# Patient Record
Sex: Male | Born: 1945 | Race: White | Hispanic: No | Marital: Single | Smoking: Former smoker
Health system: Southern US, Community
[De-identification: ages and names within clinical notes are randomized; demographics above are authoritative.]

## PROBLEM LIST (undated history)

## (undated) DIAGNOSIS — J449 Chronic obstructive pulmonary disease, unspecified: Secondary | ICD-10-CM

## (undated) DIAGNOSIS — F4310 Post-traumatic stress disorder, unspecified: Secondary | ICD-10-CM

## (undated) DIAGNOSIS — Z923 Personal history of irradiation: Secondary | ICD-10-CM

## (undated) HISTORY — DX: Chronic obstructive pulmonary disease, unspecified: J44.9

---

## 1998-03-05 ENCOUNTER — Ambulatory Visit (HOSPITAL_COMMUNITY): Admission: RE | Admit: 1998-03-05 | Discharge: 1998-03-05 | Payer: Self-pay | Admitting: Gastroenterology

## 1999-03-31 ENCOUNTER — Ambulatory Visit (HOSPITAL_COMMUNITY): Admission: RE | Admit: 1999-03-31 | Discharge: 1999-03-31 | Payer: Self-pay | Admitting: Gastroenterology

## 2001-05-17 ENCOUNTER — Emergency Department (HOSPITAL_COMMUNITY): Admission: EM | Admit: 2001-05-17 | Discharge: 2001-05-18 | Payer: Self-pay | Admitting: Emergency Medicine

## 2001-05-17 ENCOUNTER — Encounter: Payer: Self-pay | Admitting: Emergency Medicine

## 2010-04-25 ENCOUNTER — Emergency Department (HOSPITAL_COMMUNITY): Admission: EM | Admit: 2010-04-25 | Discharge: 2010-04-25 | Payer: Self-pay | Admitting: Emergency Medicine

## 2017-11-23 ENCOUNTER — Other Ambulatory Visit: Payer: Self-pay

## 2017-11-23 ENCOUNTER — Ambulatory Visit (HOSPITAL_COMMUNITY)
Admission: EM | Admit: 2017-11-23 | Discharge: 2017-11-23 | Disposition: A | Discharge status: Home / Self Care | Payer: Medicare Other | Attending: Family Medicine | Admitting: Family Medicine

## 2017-11-23 ENCOUNTER — Encounter (HOSPITAL_COMMUNITY): Payer: Self-pay | Admitting: Emergency Medicine

## 2017-11-23 DIAGNOSIS — J019 Acute sinusitis, unspecified: Secondary | ICD-10-CM

## 2017-11-23 HISTORY — DX: Post-traumatic stress disorder, unspecified: F43.10

## 2017-11-23 MED ORDER — DOXYCYCLINE HYCLATE 100 MG PO CAPS: 100.0000 mg | ORAL_CAPSULE | Freq: Two times a day (BID) | ORAL | 0 refills | Status: AC

## 2017-11-23 NOTE — Discharge Instructions (Signed)
Drink plenty of fluids to keep your secretions thin. Tylenol or ibuprofen as needed for pain or fevers. Sinus rinses with neti pot or nasal saline may be helpful for your symptoms. Mucinex may be helpful. Complete course of antibiotics. If developing worsening symptoms or no improvement in the next week return to be seen or see your primary care provider.

## 2017-11-23 NOTE — ED Provider Notes (Signed)
[place]    CSN: [id number] Arrival date & time: 11/23/17  1005     History   Chief Complaint Chief Complaint  Patient presents with  . URI    HPI WLLIAM GROSSO is a 72 y.o. male.   Mong presents with complaints of worsening sinus drainage and cough. States he had cold like symptoms which started over 10 days ago, which has developed into congestion and cough with thick productive mucus production. Minimal pain. No known fevers. Without shortness of breath or chest pain. Has been taking tylenol sinus as needed for sinus pressure,denies currently. Has also taken mucinex which seems to have minimally helped. Previously used zycam nasal spray but states he has stopped using this. Denies gi/gu complaints. No known ill contacts. He has PTSD but without any other medical history. Penicillin allergy.     ROS per HPI.       Past Medical History:  Diagnosis Date  . PTSD (post-traumatic stress disorder)     There are no active problems to display for this patient.   History reviewed. No pertinent surgical history.     Home Medications    Prior to Admission medications   Medication Sig Start Date End Date Taking? Authorizing Provider  citalopram (CELEXA) 20 MG tablet Take 20 mg by mouth daily.   Yes [provider]  doxycycline (VIBRAMYCIN) 100 MG capsule Take 1 capsule (100 mg total) by mouth 2 (two) times daily for 7 days. 11/23/17 11/30/17  Zigmund Gottron, NP    Family History History reviewed. No pertinent family history.  Social History Social History   Tobacco Use  . Smoking status: Current Every Day Smoker  Substance Use Topics  . Alcohol use: No    Frequency: Never  . Drug use: No     Allergies   Penicillins and Sulfa antibiotics   Review of Systems Review of Systems   Physical Exam Triage Vital Signs ED Triage Vitals [11/23/17 1037]  Enc Vitals Group     BP (!) 147/92     Pulse Rate 83     Resp 20     Temp (!)  97.4 F (36.3 C)     Temp Source Oral     SpO2 96 %     Weight      Height      Head Circumference      Peak Flow      Pain Score      Pain Loc      Pain Edu?      Excl. in [place]?    No data found.  Updated Vital Signs BP (!) 147/92 (BP Location: Left Arm)   Pulse 83   Temp (!) 97.4 F (36.3 C) (Oral)   Resp 20   SpO2 96%   Visual Acuity Right Eye Distance:   Left Eye Distance:   Bilateral Distance:    Right Eye Near:   Left Eye Near:    Bilateral Near:     Physical Exam  Constitutional: He is oriented to person, place, and time. He appears well-developed and well-nourished.  HENT:  Head: Normocephalic and atraumatic.  Right Ear: Tympanic membrane, external ear and ear canal normal.  Left Ear: Tympanic membrane, external ear and ear canal normal.  Nose: Nose normal. Right sinus exhibits no maxillary sinus tenderness and no frontal sinus tenderness. Left sinus exhibits no maxillary sinus tenderness and no frontal sinus tenderness.  Mouth/Throat: Uvula is midline, oropharynx is clear and moist  and mucous membranes are normal.  Eyes: Conjunctivae are normal. Pupils are equal, round, and reactive to light.  Neck: Normal range of motion.  Cardiovascular: Normal rate and regular rhythm.  Pulmonary/Chest: Effort normal and breath sounds normal.  Occasional congested cough noted  Lymphadenopathy:    He has no cervical adenopathy.  Neurological: He is alert and oriented to person, place, and time.  Skin: Skin is warm and dry.  Vitals reviewed.    UC Treatments / Results  Labs (all labs ordered are listed, but only abnormal results are displayed) Labs Reviewed - No data to display  EKG  EKG Interpretation None       Radiology No results found.  Procedures Procedures (including critical care time)  Medications Ordered in UC Medications - No data to display   Initial Impression / Assessment and Plan / UC Course  I have reviewed the triage vital signs and  the nursing notes.  Pertinent labs & imaging results that were available during my care of the patient were reviewed by me and considered in my medical decision making (see chart for details).     Symptoms greater than 10 days with mild worsening. Penicillin allergy, doxycycline initiated for sinusitis. Continue with supportive cares. Push fluids. If symptoms worsen or do not improve in the next week to return to be seen or to follow up with PCP.  Patient verbalized understanding and agreeable to plan.    Final Clinical Impressions(s) / UC Diagnoses   Final diagnoses:  Acute non-recurrent sinusitis, unspecified location    ED Discharge Orders        Ordered    doxycycline (VIBRAMYCIN) 100 MG capsule  2 times daily     11/23/17 1057       Controlled Substance Prescriptions [place] Controlled Substance Registry consulted? Not Applicable   Zigmund Gottron, NP 11/23/17 1104

## 2017-11-23 NOTE — ED Triage Notes (Signed)
One week ago had laryngitis, sinus drainage, sneezing, cough-these symptoms seemed to improve, but now drainage in throat, cough has worsened and phlegm is thick.  States he does not feel bad

## 2018-12-06 ENCOUNTER — Ambulatory Visit (HOSPITAL_COMMUNITY)
Admission: EM | Admit: 2018-12-06 | Discharge: 2018-12-06 | Disposition: A | Discharge status: Home / Self Care | Payer: Medicare Other | Attending: Family Medicine | Admitting: Family Medicine

## 2018-12-06 ENCOUNTER — Other Ambulatory Visit: Payer: Self-pay

## 2018-12-06 ENCOUNTER — Encounter (HOSPITAL_COMMUNITY): Payer: Self-pay

## 2018-12-06 DIAGNOSIS — J0110 Acute frontal sinusitis, unspecified: Secondary | ICD-10-CM | POA: Insufficient documentation

## 2018-12-06 MED ORDER — DOXYCYCLINE HYCLATE 100 MG PO CAPS: 100.0000 mg | ORAL_CAPSULE | Freq: Two times a day (BID) | ORAL | 0 refills | Status: DC

## 2018-12-06 NOTE — ED Triage Notes (Signed)
Pt cc sinus pressure , drainage, cough and fever this started yesterday.

## 2018-12-06 NOTE — Discharge Instructions (Addendum)
We will go ahead and treat you for a sinus infection Continue the nasal spray  and tylenol sinus Follow up as needed for continued or worsening symptoms

## 2018-12-06 NOTE — ED Provider Notes (Signed)
[place]    CSN: [id number] Arrival date & time: 12/06/18  0800     History   Chief Complaint Chief Complaint  Patient presents with  . Facial Pain    HPI QUADARIUS HENTON is a 73 y.o. male.   Patient is a 73 year old male with no significant past medical history.  He presents with sinus congestion, pressure, postnasal drip.  He reports pressure when bending over and fever of 100.6 last night.  He has been taking Tylenol sinus with minimal relief of symptoms.  He has had sinus infections in the past.  He has a mild cough but no chest congestion. He does smoke. No recent sick contacts. No chest pain, SOB.   ROS per HPI      Past Medical History:  Diagnosis Date  . PTSD (post-traumatic stress disorder)     There are no active problems to display for this patient.   History reviewed. No pertinent surgical history.     Home Medications    Prior to Admission medications   Medication Sig Start Date End Date Taking? Authorizing Provider  citalopram (CELEXA) 20 MG tablet Take 20 mg by mouth daily.    [provider]  doxycycline (VIBRAMYCIN) 100 MG capsule Take 1 capsule (100 mg total) by mouth 2 (two) times daily. 12/06/18   Orvan July, NP    Family History History reviewed. No pertinent family history.  Social History Social History   Tobacco Use  . Smoking status: Current Every Day Smoker  . Smokeless tobacco: Never Used  Substance Use Topics  . Alcohol use: No    Frequency: Never  . Drug use: No     Allergies   Penicillins and Sulfa antibiotics   Review of Systems Review of Systems   Physical Exam Triage Vital Signs ED Triage Vitals  Enc Vitals Group     BP      Pulse      Resp      Temp      Temp src      SpO2      Weight      Height      Head Circumference      Peak Flow      Pain Score      Pain Loc      Pain Edu?      Excl. in [place]?    No data found.  Updated Vital Signs BP 101/68 (BP Location: Right  Arm)   Pulse 97   Temp 98.7 F (37.1 C)   Resp 18   Wt 154 lb 8 oz (70.1 kg)   SpO2 96%   Visual Acuity Right Eye Distance:   Left Eye Distance:   Bilateral Distance:    Right Eye Near:   Left Eye Near:    Bilateral Near:     Physical Exam Vitals signs and nursing note reviewed.  Constitutional:      Appearance: He is well-developed. He is not ill-appearing or toxic-appearing.  HENT:     Head: Normocephalic and atraumatic.     Right Ear: Tympanic membrane and ear canal normal.     Left Ear: Tympanic membrane and ear canal normal.     Nose: Congestion and rhinorrhea present.     Right Turbinates: Swollen.     Left Turbinates: Swollen.     Right Sinus: Frontal sinus tenderness present.     Left Sinus: Frontal sinus tenderness present.     Mouth/Throat:  Pharynx: Oropharynx is clear.  Eyes:     Conjunctiva/sclera: Conjunctivae normal.  Neck:     Musculoskeletal: Neck supple.  Cardiovascular:     Rate and Rhythm: Normal rate and regular rhythm.     Heart sounds: No murmur.  Pulmonary:     Effort: Pulmonary effort is normal. No respiratory distress.     Breath sounds: Normal breath sounds.  Abdominal:     Palpations: Abdomen is soft.     Tenderness: There is no abdominal tenderness.  Musculoskeletal: Normal range of motion.  Skin:    General: Skin is warm and dry.  Neurological:     Mental Status: He is alert.  Psychiatric:        Mood and Affect: Mood normal.      UC Treatments / Results  Labs (all labs ordered are listed, but only abnormal results are displayed) Labs Reviewed - No data to display  EKG None  Radiology No results found.  Procedures Procedures (including critical care time)  Medications Ordered in UC Medications - No data to display  Initial Impression / Assessment and Plan / UC Course  I have reviewed the triage vital signs and the nursing notes.  Pertinent labs & imaging results that were available during my care of the  patient were reviewed by me and considered in my medical decision making (see chart for details).     Will go ahead and treat for sinus infection with doxycyline.  Continue the sinus medication and nasal spray.  Follow up as needed for continued or worsening symptoms  Final Clinical Impressions(s) / UC Diagnoses   Final diagnoses:  Acute non-recurrent frontal sinusitis     Discharge Instructions     We will go ahead and treat you for a sinus infection Continue the nasal spray  and tylenol sinus Follow up as needed for continued or worsening symptoms     ED Prescriptions    Medication Sig Dispense Auth. Provider   doxycycline (VIBRAMYCIN) 100 MG capsule Take 1 capsule (100 mg total) by mouth 2 (two) times daily. 20 capsule Loura Halt A, NP     Controlled Substance Prescriptions [place] Controlled Substance Registry consulted? Not Applicable   Orvan July, NP 12/06/18 1344

## 2018-12-10 ENCOUNTER — Encounter (HOSPITAL_COMMUNITY): Payer: Self-pay

## 2018-12-10 ENCOUNTER — Emergency Department (HOSPITAL_COMMUNITY): Payer: Medicare Other

## 2018-12-10 ENCOUNTER — Ambulatory Visit (HOSPITAL_COMMUNITY)
Admission: EM | Admit: 2018-12-10 | Discharge: 2018-12-10 | Disposition: A | Discharge status: Home / Self Care | Payer: Medicare Other | Source: Home / Self Care

## 2018-12-10 ENCOUNTER — Other Ambulatory Visit: Payer: Self-pay

## 2018-12-10 ENCOUNTER — Inpatient Hospital Stay (HOSPITAL_COMMUNITY)
Admission: EM | Admit: 2018-12-10 | Discharge: 2018-12-12 | DRG: 193 | Disposition: A | Discharge status: Home / Self Care | Payer: Medicare Other | Attending: Internal Medicine | Admitting: Internal Medicine

## 2018-12-10 ENCOUNTER — Encounter (HOSPITAL_COMMUNITY): Payer: Self-pay | Admitting: Emergency Medicine

## 2018-12-10 DIAGNOSIS — J440 Chronic obstructive pulmonary disease with acute lower respiratory infection: Secondary | ICD-10-CM | POA: Diagnosis present

## 2018-12-10 DIAGNOSIS — E871 Hypo-osmolality and hyponatremia: Secondary | ICD-10-CM | POA: Diagnosis present

## 2018-12-10 DIAGNOSIS — J101 Influenza due to other identified influenza virus with other respiratory manifestations: Principal | ICD-10-CM | POA: Diagnosis present

## 2018-12-10 DIAGNOSIS — R0902 Hypoxemia: Secondary | ICD-10-CM | POA: Insufficient documentation

## 2018-12-10 DIAGNOSIS — F17200 Nicotine dependence, unspecified, uncomplicated: Secondary | ICD-10-CM | POA: Diagnosis present

## 2018-12-10 DIAGNOSIS — J441 Chronic obstructive pulmonary disease with (acute) exacerbation: Secondary | ICD-10-CM | POA: Diagnosis present

## 2018-12-10 DIAGNOSIS — F17210 Nicotine dependence, cigarettes, uncomplicated: Secondary | ICD-10-CM | POA: Diagnosis present

## 2018-12-10 DIAGNOSIS — F4310 Post-traumatic stress disorder, unspecified: Secondary | ICD-10-CM | POA: Diagnosis not present

## 2018-12-10 DIAGNOSIS — J209 Acute bronchitis, unspecified: Secondary | ICD-10-CM | POA: Insufficient documentation

## 2018-12-10 DIAGNOSIS — Z79899 Other long term (current) drug therapy: Secondary | ICD-10-CM

## 2018-12-10 DIAGNOSIS — J9601 Acute respiratory failure with hypoxia: Secondary | ICD-10-CM | POA: Diagnosis present

## 2018-12-10 DIAGNOSIS — Z880 Allergy status to penicillin: Secondary | ICD-10-CM

## 2018-12-10 DIAGNOSIS — Z87891 Personal history of nicotine dependence: Secondary | ICD-10-CM | POA: Diagnosis present

## 2018-12-10 DIAGNOSIS — J9600 Acute respiratory failure, unspecified whether with hypoxia or hypercapnia: Secondary | ICD-10-CM

## 2018-12-10 DIAGNOSIS — J449 Chronic obstructive pulmonary disease, unspecified: Secondary | ICD-10-CM | POA: Diagnosis present

## 2018-12-10 LAB — CBC WITH DIFFERENTIAL/PLATELET
Abs Immature Granulocytes: 0.01 10*3/uL (ref 0.00–0.07)
Basophils Absolute: 0 10*3/uL (ref 0.0–0.1)
Basophils Relative: 0 %
Eosinophils Absolute: 0 10*3/uL (ref 0.0–0.5)
Eosinophils Relative: 0 %
HCT: 50.5 % (ref 39.0–52.0)
Hemoglobin: 15.9 g/dL (ref 13.0–17.0)
Immature Granulocytes: 0 %
Lymphocytes Relative: 30 %
Lymphs Abs: 1.3 10*3/uL (ref 0.7–4.0)
MCH: 29.2 pg (ref 26.0–34.0)
MCHC: 31.5 g/dL (ref 30.0–36.0)
MCV: 92.7 fL (ref 80.0–100.0)
Monocytes Absolute: 0.6 10*3/uL (ref 0.1–1.0)
Monocytes Relative: 14 %
Neutro Abs: 2.5 10*3/uL (ref 1.7–7.7)
Neutrophils Relative %: 56 %
Platelets: 167 10*3/uL (ref 150–400)
RBC: 5.45 MIL/uL (ref 4.22–5.81)
RDW: 12.5 % (ref 11.5–15.5)
WBC: 4.5 10*3/uL (ref 4.0–10.5)
nRBC: 0 % (ref 0.0–0.2)

## 2018-12-10 LAB — BASIC METABOLIC PANEL
Anion gap: 9 (ref 5–15)
BUN: 11 mg/dL (ref 8–23)
CO2: 31 mmol/L (ref 22–32)
Calcium: 9.1 mg/dL (ref 8.9–10.3)
Chloride: 93 mmol/L — ABNORMAL LOW (ref 98–111)
Creatinine, Ser: 0.85 mg/dL (ref 0.61–1.24)
GFR calc Af Amer: >60 mL/min (ref >60)
GFR calc non Af Amer: >60 mL/min (ref >60)
Glucose, Bld: 98 mg/dL (ref 70–99)
Potassium: 4.4 mmol/L (ref 3.5–5.1)
Sodium: 133 mmol/L — ABNORMAL LOW (ref 135–145)

## 2018-12-10 LAB — BRAIN NATRIURETIC PEPTIDE: B Natriuretic Peptide: 85 pg/mL (ref 0.0–100.0)

## 2018-12-10 LAB — I-STAT TROPONIN, ED: Troponin i, poc: 0.01 ng/mL (ref 0.00–0.08)

## 2018-12-10 LAB — INFLUENZA PANEL BY PCR (TYPE A & B)
Influenza A By PCR: POSITIVE — AB
Influenza B By PCR: NEGATIVE

## 2018-12-10 MED ORDER — OSELTAMIVIR PHOSPHATE 75 MG PO CAPS
75.0000 mg | ORAL_CAPSULE | Freq: Every day | ORAL | Status: DC
  Administered 2018-12-10 – 2018-12-12 (×3): 75 mg via ORAL
  Filled 2018-12-10 (×3): qty 1

## 2018-12-10 MED ORDER — SODIUM CHLORIDE 0.9 % IV BOLUS
500.0000 mL | Freq: Once | INTRAVENOUS | Status: AC
  Administered 2018-12-10: 500 mL via INTRAVENOUS

## 2018-12-10 MED ORDER — IPRATROPIUM-ALBUTEROL 0.5-2.5 (3) MG/3ML IN SOLN
3.0000 mL | Freq: Once | RESPIRATORY_TRACT | Status: AC
  Administered 2018-12-10: 3 mL via RESPIRATORY_TRACT
  Filled 2018-12-10: qty 3

## 2018-12-10 MED ORDER — PREDNISONE 20 MG PO TABS
40.0000 mg | ORAL_TABLET | Freq: Every day | ORAL | Status: DC
  Administered 2018-12-11 – 2018-12-12 (×2): 40 mg via ORAL
  Filled 2018-12-10 (×2): qty 2

## 2018-12-10 MED ORDER — CITALOPRAM HYDROBROMIDE 20 MG PO TABS
20.0000 mg | ORAL_TABLET | Freq: Every day | ORAL | Status: DC
  Administered 2018-12-11 – 2018-12-12 (×2): 20 mg via ORAL
  Filled 2018-12-10 (×4): qty 1
  Filled 2018-12-10: qty 2

## 2018-12-10 MED ORDER — ACETAMINOPHEN 325 MG PO TABS: 650.0000 mg | ORAL_TABLET | Freq: Four times a day (QID) | ORAL | Status: DC | PRN

## 2018-12-10 MED ORDER — PREDNISONE 20 MG PO TABS
40.0000 mg | ORAL_TABLET | Freq: Once | ORAL | Status: AC
  Administered 2018-12-10: 40 mg via ORAL
  Filled 2018-12-10: qty 2

## 2018-12-10 MED ORDER — IPRATROPIUM-ALBUTEROL 0.5-2.5 (3) MG/3ML IN SOLN
3.0000 mL | Freq: Four times a day (QID) | RESPIRATORY_TRACT | Status: DC
  Administered 2018-12-10 – 2018-12-12 (×9): 3 mL via RESPIRATORY_TRACT
  Filled 2018-12-10 (×9): qty 3

## 2018-12-10 MED ORDER — ALBUTEROL SULFATE (2.5 MG/3ML) 0.083% IN NEBU: 2.5000 mg | INHALATION_SOLUTION | RESPIRATORY_TRACT | Status: DC | PRN

## 2018-12-10 MED ORDER — ENOXAPARIN SODIUM 40 MG/0.4ML ~~LOC~~ SOLN
40.0000 mg | SUBCUTANEOUS | Status: DC
  Filled 2018-12-10: qty 0.4

## 2018-12-10 MED ORDER — DOXYCYCLINE HYCLATE 100 MG PO TABS
100.0000 mg | ORAL_TABLET | Freq: Two times a day (BID) | ORAL | Status: DC
  Administered 2018-12-11 – 2018-12-12 (×3): 100 mg via ORAL
  Filled 2018-12-10 (×4): qty 1

## 2018-12-10 MED ORDER — ACETAMINOPHEN 650 MG RE SUPP: 650.0000 mg | Freq: Four times a day (QID) | RECTAL | Status: DC | PRN

## 2018-12-10 NOTE — Progress Notes (Signed)
Patient educated on IS. Good patient effort. Pulling volumes >750.

## 2018-12-10 NOTE — ED Triage Notes (Signed)
Pt sent over from UC with complaint of shortness of breath. Pt was recently diagnosed with sinus infection and placed on doxycycline. Pt was noted to be 73% SPO2 on arrival at [facility]. Pt currently 97% on 4L.

## 2018-12-10 NOTE — ED Provider Notes (Signed)
[facility]GENC[facility] DEPART[facility] Provider Note   CSN: [id number] Arrival date & time: 12/10/18  0827     History   Chief Complaint Chief Complaint  Patient presents with  . Shortness of Breath    HPI Martin Mendez is a 73 y.o. male with history of PTSD presents for evaluation of acute onset, progressively worsening shortness of breath for 4 days.  He reports nasal congestion, hoarse voice, cough productive of frothy sputum.  He notes some chest tightness but denies any other chest pain.  He denies leg swelling, nausea, vomiting, diarrhea, or abdominal pain.  He reports that he did have a fever 4 days ago which resolved with Tylenol.  He went to an urgent care and was prescribed antibiotics for a sinus infection but reports that this has not been helpful.  Symptoms worsen with any activity.  He returned to urgent care this morning and was found to be hypoxic with SPO2 saturation 78% on room air.  He has no oxygen requirement at home.  He is a current smoker of approximately a pack of cigarettes daily, denies recreational drug use or alcohol use.  No recent travel or surgeries, no hemoptysis, no prior history of DVT or PE, and he is not on hormone replacement therapy.  The history is provided by the patient.    Past Medical History:  Diagnosis Date  . PTSD (post-traumatic stress disorder)     Patient Active Problem List   Diagnosis Date Noted  . Influenza A 12/10/2018  . Acute wheezy bronchitis 12/10/2018  . Hypoxia 12/10/2018  . Smoker 12/10/2018  . PTSD (post-traumatic stress disorder) 12/10/2018    History reviewed. No pertinent surgical history.      Home Medications    Prior to Admission medications   Medication Sig Start Date End Date Taking? Authorizing Provider  citalopram (CELEXA) 20 MG tablet Take 20 mg by mouth daily.    Yes [provider]  doxycycline (VIBRAMYCIN) 100 MG capsule Take 1 capsule (100 mg total) by mouth 2 (two) times  daily. 12/06/18  Yes Orvan July, NP    Family History History reviewed. No pertinent family history.  Social History Social History   Tobacco Use  . Smoking status: Current Every Day Smoker  . Smokeless tobacco: Never Used  Substance Use Topics  . Alcohol use: No    Frequency: Never  . Drug use: No     Allergies   Penicillins and Sulfa antibiotics   Review of Systems Review of Systems  Constitutional: Positive for fever. Negative for chills.  Respiratory: Positive for cough, chest tightness and shortness of breath.   Cardiovascular: Negative for chest pain and leg swelling.  Gastrointestinal: Negative for abdominal pain, nausea and vomiting.  All other systems reviewed and are negative.    Physical Exam Updated Vital Signs BP 97/67   Pulse 85   Temp 97.9 F (36.6 C) (Oral)   Resp 18   Ht 5\' 10"  (1.778 m)   Wt 70.1 kg   SpO2 96%   BMI 22.17 kg/m   Physical Exam Vitals signs and nursing note reviewed.  Constitutional:      General: He is not in acute distress.    Appearance: He is well-developed.  HENT:     Head: Normocephalic and atraumatic.  Eyes:     General:        Right eye: No discharge.        Left eye: No discharge.  Conjunctiva/sclera: Conjunctivae normal.  Neck:     Vascular: No JVD.     Trachea: No tracheal deviation.  Cardiovascular:     Rate and Rhythm: Normal rate.     Pulses: Normal pulses.     Comments: 2+ radial and DP/PT pulses bilaterally, Homans sign absent bilaterally, no lower extremity edema, no palpable cords, compartments are soft  Pulmonary:     Effort: Pulmonary effort is normal.     Comments: SPO2 saturation 76% on room air, improved to 96% on 4 L.  Scattered expiratory wheezing and crackles, diminished lung sounds on the left. Abdominal:     General: Bowel sounds are normal. There is no distension.     Palpations: Abdomen is soft. There is no mass.     Tenderness: There is no abdominal tenderness. There is no  guarding or rebound.  Musculoskeletal:     Right lower leg: He exhibits no tenderness. No edema.     Left lower leg: He exhibits no tenderness. No edema.  Skin:    Findings: No erythema.  Neurological:     Mental Status: He is alert.  Psychiatric:        Behavior: Behavior normal.      ED Treatments / Results  Labs (all labs ordered are listed, but only abnormal results are displayed) Labs Reviewed  BASIC METABOLIC PANEL - Abnormal; Notable for the following components:      Result Value   Sodium 133 (*)    Chloride 93 (*)    All other components within normal limits  INFLUENZA PANEL BY PCR (TYPE A & B) - Abnormal; Notable for the following components:   Influenza A By PCR POSITIVE (*)    All other components within normal limits  CBC WITH DIFFERENTIAL/PLATELET  BRAIN NATRIURETIC PEPTIDE  I-STAT TROPONIN, ED    EKG EKG Interpretation  Date/Time:  Monday December 10 2018 08:41:49 EST Ventricular Rate:  86 PR Interval:    QRS Duration: 101 QT Interval:  358 QTC Calculation: 429 R Axis:   80 Text Interpretation:  Sinus rhythm Short PR interval RSR' in V1 or V2, right VCD or RVH Confirmed by Quintella Reichert [phone]) on 12/10/2018 9:09:14 AM   Radiology Dg Chest Port 1 View  Result Date: 12/10/2018 CLINICAL DATA:  Shortness of breath since Saturday EXAM: PORTABLE CHEST 1 VIEW COMPARISON:  None. FINDINGS: Normal heart size. Mild aortic tortuosity. Large lung volumes with mild interstitial coarsening. There is no edema, consolidation, effusion, or pneumothorax. IMPRESSION: No acute finding. Possible COPD. Electronically Signed   By: Monte Fantasia M.D.   On: 12/10/2018 09:16    Procedures .Critical Care Performed by: Renita Papa, PA-C Authorized by: Renita Papa, PA-C   Critical care provider statement:    Critical care time (minutes):  35   Critical care was necessary to treat or prevent imminent or life-threatening deterioration of the following conditions:   Respiratory failure   Critical care was time spent personally by me on the following activities:  Discussions with consultants, evaluation of patient's response to treatment, examination of patient, ordering and performing treatments and interventions, ordering and review of laboratory studies, ordering and review of radiographic studies, pulse oximetry, re-evaluation of patient's condition, obtaining history from patient or surrogate and review of old charts   I assumed direction of critical care for this patient from another provider in my specialty: no     (including critical care time)  Medications Ordered in ED Medications  oseltamivir (TAMIFLU) capsule  75 mg (75 mg Oral Given 12/10/18 1227)  doxycycline (VIBRA-TABS) tablet 100 mg (100 mg Oral Not Given 12/10/18 1222)  citalopram (CELEXA) tablet 20 mg (20 mg Oral Not Given 12/10/18 1227)  enoxaparin (LOVENOX) injection 40 mg (has no administration in time range)  acetaminophen (TYLENOL) tablet 650 mg (has no administration in time range)    Or  acetaminophen (TYLENOL) suppository 650 mg (has no administration in time range)  predniSONE (DELTASONE) tablet 40 mg (has no administration in time range)  albuterol (PROVENTIL) (2.5 MG/3ML) 0.083% nebulizer solution 2.5 mg (has no administration in time range)  ipratropium-albuterol (DUONEB) 0.5-2.5 (3) MG/3ML nebulizer solution 3 mL (has no administration in time range)  ipratropium-albuterol (DUONEB) 0.5-2.5 (3) MG/3ML nebulizer solution 3 mL (3 mLs Nebulization Given 12/10/18 0859)  sodium chloride 0.9 % bolus 500 mL (0 mLs Intravenous Stopped 12/10/18 0943)  predniSONE (DELTASONE) tablet 40 mg (40 mg Oral Given 12/10/18 0942)     Initial Impression / Assessment and Plan / ED Course  I have reviewed the triage vital signs and the nursing notes.  Pertinent labs & imaging results that were available during my care of the patient were reviewed by me and considered in my medical decision making (see  chart for details).     Patient presenting for evaluation of flulike symptoms, shortness of breath and dyspnea on exertion.  He is afebrile, found to be hypoxic on room air at the urgent care and sent to the ED for further evaluation.  Saturations improved on 4 L via nasal cannula.  Chest x-ray shows no acute findings but does show possible COPD.  He did have some improvement in breath sounds after administration of a breathing treatment and IV steroids.  Lab work reviewed by me shows no leukocytosis, no anemia, no metabolic derangements, no renal insufficiency.  He is positive for flu A.  He stated that he would like to go home on reassessment however I informed him that given his hypoxia with new oxygen requirement it would be unsafe and inadvisable for him to go home.  He was ambulated on room air with SPO2 saturations dropping to 73% and he did report feeling unwell.  He was amenable to admission to the hospital.  Spoke with Dr. Sloan Leiter with [facility] hospitalist service who agrees to assume care of patient and bring him into the hospital for further evaluation and management.  Patient seen and evaluated by Dr. Ralene Bathe who agrees with assessment and plan at this time.  Final Clinical Impressions(s) / ED Diagnoses   Final diagnoses:  Acute respiratory failure, unspecified whether with hypoxia or hypercapnia [facility])  Influenza A    ED Discharge Orders    None       Debroah Baller 12/10/18 1238    Quintella Reichert, MD 12/12/18 1015

## 2018-12-10 NOTE — H&P (Signed)
History and Physical    Martin Mendez YBO:[id number] DOB: 04/20/1946 DOA: 12/10/2018  PCP: Lavone Orn, MD  Patient coming from: home   I have personally briefly reviewed patient's old medical records available.   Chief Complaint: Shortness of breath and wheezing.  HPI: Martin Mendez is a 73 y.o. male with medical history significant of PTSD, smoker but no other medical problems presents to the emergency room after referral from urgent care with low oxygen.  Patient suffering from cough and wheezing for 4 to 5 days.  According to the patient, he does not take flu shot.  He started having some cough and nasal congestion starting 12/06/2018.  He went to urgent care with sinus pressure, clear drainage, cough and postnasal drip.  He had low-grade temperature.  Had a mild cough.  He was seen at urgent care and discharged on doxycycline.  His fever subsided.  His shortness of breath is gradually worsening, he has some wheezing, worse voice and now cough with productive sputum.  Some chest tightness but no chest pain.  He returned to urgent care this morning with the symptoms, he was hypoxic, 78% on room air so he was directly to the ER. Denies any chills.  Denies any chest pain.  No recent travel.  No sick contacts.  He lives by himself.  Denies any leg swelling or calf tenderness.  He is taking doxycycline from 3 days ago. ED Course: Hypoxic on room air with 76%.  He was generalized wheezing.  Blood pressures are stable.  Chest x-ray is normal.  EKG is normal.  Patient was tested positive for influenza A.  Was given a dose of oral steroids and nebulizer with some improvement.  Patient is a still needing 4 L of oxygen.  He really wants to go home, however due to hypoxia he needs to stay in the hospital.  Review of Systems: As per HPI otherwise 10 point review of systems negative.    Past Medical History:  Diagnosis Date  . PTSD (post-traumatic stress disorder)     History reviewed. No pertinent  surgical history.   reports that he has been smoking. He has never used smokeless tobacco. He reports that he does not drink alcohol or use drugs.  Allergies  Allergen Reactions  . Penicillins Swelling    Did it involve swelling of the face/tongue/throat, SOB, or low BP? No Did it involve sudden or severe rash/hives, skin peeling, or any reaction on the inside of your mouth or nose? No Did you need to seek medical attention at a hospital or doctor's office? No When did it last happen?32 years ago (age 89) If all above answers are "NO", may proceed with cephalosporin use.  Swelling of arms and legs  . Sulfa Antibiotics Rash    tongue    History reviewed. No pertinent family history.   Prior to Admission medications   Medication Sig Start Date End Date Taking? Authorizing Provider  citalopram (CELEXA) 20 MG tablet Take 20 mg by mouth daily.    Yes [provider]  doxycycline (VIBRAMYCIN) 100 MG capsule Take 1 capsule (100 mg total) by mouth 2 (two) times daily. 12/06/18  Yes Orvan July, NP    Physical Exam: Vitals:   12/10/18 0837 12/10/18 0846 12/10/18 0945  BP: 97/66 110/80 110/68  Pulse: 84 97 79  Resp: (!) 22 (!) 24 19  Temp: 97.9 F (36.6 C)    TempSrc: Oral    SpO2: 96% 97% 100%  Constitutional: NAD, calm, comfortable Vitals:   12/10/18 0837 12/10/18 0846 12/10/18 0945  BP: 97/66 110/80 110/68  Pulse: 84 97 79  Resp: (!) 22 (!) 24 19  Temp: 97.9 F (36.6 C)    TempSrc: Oral    SpO2: 96% 97% 100%   Eyes: PERRL, lids and conjunctivae normal Patient is comfortable on 3 L of oxygen.  He is able to finish sentences. ENMT: Mucous membranes are moist. Posterior pharynx clear of any exudate or lesions.Normal dentition.  Neck: normal, supple, no masses, no thyromegaly Respiratory: Occasional bilateral expiratory wheezes.  No other added sounds.  Normal respiratory effort. No accessory muscle use.  Cardiovascular: Regular rate and rhythm, no  murmurs / rubs / gallops. No extremity edema. 2+ pedal pulses. No carotid bruits.  Abdomen: no tenderness, no masses palpated. No hepatosplenomegaly. Bowel sounds positive.  Musculoskeletal: no clubbing / cyanosis. No joint deformity upper and lower extremities. Good ROM, no contractures. Normal muscle tone.  Skin: no rashes, lesions, ulcers. No induration Neurologic: CN 2-12 grossly intact. Sensation intact, DTR normal. Strength 5/5 in all 4.  Psychiatric: Normal judgment and insight. Alert and oriented x 3. Normal mood.     Labs on Admission: I have personally reviewed following labs and imaging studies  CBC: Recent Labs  Lab 12/10/18 0851  WBC 4.5  NEUTROABS 2.5  HGB 15.9  HCT 50.5  MCV 92.7  PLT 852   Basic Metabolic Panel: Recent Labs  Lab 12/10/18 0851  NA 133*  K 4.4  CL 93*  CO2 31  GLUCOSE 98  BUN 11  CREATININE 0.85  CALCIUM 9.1   GFR: CrCl cannot be calculated (Unknown ideal weight.). Liver Function Tests: No results for input(s): AST, ALT, ALKPHOS, BILITOT, PROT, ALBUMIN in the last 168 hours. No results for input(s): LIPASE, AMYLASE in the last 168 hours. No results for input(s): AMMONIA in the last 168 hours. Coagulation Profile: No results for input(s): INR, PROTIME in the last 168 hours. Cardiac Enzymes: No results for input(s): CKTOTAL, CKMB, CKMBINDEX, TROPONINI in the last 168 hours. BNP (last 3 results) No results for input(s): PROBNP in the last 8760 hours. HbA1C: No results for input(s): HGBA1C in the last 72 hours. CBG: No results for input(s): GLUCAP in the last 168 hours. Lipid Profile: No results for input(s): CHOL, HDL, LDLCALC, TRIG, CHOLHDL, LDLDIRECT in the last 72 hours. Thyroid Function Tests: No results for input(s): TSH, T4TOTAL, FREET4, T3FREE, THYROIDAB in the last 72 hours. Anemia Panel: No results for input(s): VITAMINB12, FOLATE, FERRITIN, TIBC, IRON, RETICCTPCT in the last 72 hours. Urine analysis: No results found for:  COLORURINE, APPEARANCEUR, LABSPEC, PHURINE, GLUCOSEU, HGBUR, BILIRUBINUR, KETONESUR, PROTEINUR, UROBILINOGEN, NITRITE, LEUKOCYTESUR  Radiological Exams on Admission: Dg Chest Port 1 View  Result Date: 12/10/2018 CLINICAL DATA:  Shortness of breath since Saturday EXAM: PORTABLE CHEST 1 VIEW COMPARISON:  None. FINDINGS: Normal heart size. Mild aortic tortuosity. Large lung volumes with mild interstitial coarsening. There is no edema, consolidation, effusion, or pneumothorax. IMPRESSION: No acute finding. Possible COPD. Electronically Signed   By: Monte Fantasia M.D.   On: 12/10/2018 09:16    EKG: Independently reviewed.  Normal sinus rhythm.  QTC is 429.  No other acute changes.  Assessment/Plan Principal Problem:   Hypoxia Active Problems:   Influenza A   Acute wheezy bronchitis   Smoker   PTSD (post-traumatic stress disorder)   Acute influenza A infection with hypoxia and wheezy bronchitis: Agree with admission to monitored unit because of severity of symptoms.  bronchodilator therapy, short course of oral steroids, scheduled and as needed bronchodilators, deep breathing exercises, incentive spirometry, chest physiotherapy. Patient is taking doxycycline, he can finish 7 days of therapy. Due to severity of symptoms, though symptom onset is already 4 days, he is hypoxic and needed hospitalization, will benefit with Tamiflu for 5 days. Supplemental oxygen to keep saturations more than 90% Need ambulatory oxygen measurement before discharge.  Smoker: Patient continues to smoke.  Patient is not willing to quit the smoking.  We discussed in brief about adverse health outcomes with continuous smoking.  Counseling provided.  Patient will be provided with written counseling and instructions for smoking cessation.  He is not ready to quit his smoking and does not want any nicotine replacement.  PTSD: Currently without active symptoms.  He is on SSRI.  That he will continue.    DVT prophylaxis:  Lovenox. Code Status: Full code. Family Communication: Patient with decision-making capacity. Disposition Plan: Home. Consults called: None. Admission status: Observation MedSurg.   Barb Merino MD [facility] Pager [phone]  If 7PM-7AM, please contact night-coverage [email or web address] Password [email or web address]  12/10/2018, 11:58 AM

## 2018-12-10 NOTE — ED Notes (Signed)
Pt ambulated in hallway on RA, O2 sats dropped into 80s.

## 2018-12-10 NOTE — ED Triage Notes (Signed)
Recently treated for sinus infection.  Over the weekend, started having sob.  Sat in 70's. Placed on 4l [place] and notified [place], [place].  On 4l-93%

## 2018-12-10 NOTE — ED Provider Notes (Signed)
I came in for emergency vital signs at triage. Pulse oximetry was 78% on triage, he was placed on 4L of oxygen and went up after 5 minutes to 93%. Patient does not use any oxygen at home. I redirected patient to the ER for emergent evaluation.   Jaynee Eagles, [place] 12/10/18 [phone]

## 2018-12-10 NOTE — ED Notes (Signed)
Patient place on 4L O2

## 2018-12-11 DIAGNOSIS — F4310 Post-traumatic stress disorder, unspecified: Secondary | ICD-10-CM | POA: Diagnosis not present

## 2018-12-11 DIAGNOSIS — E871 Hypo-osmolality and hyponatremia: Secondary | ICD-10-CM | POA: Diagnosis present

## 2018-12-11 DIAGNOSIS — J9601 Acute respiratory failure with hypoxia: Secondary | ICD-10-CM

## 2018-12-11 DIAGNOSIS — J209 Acute bronchitis, unspecified: Secondary | ICD-10-CM

## 2018-12-11 DIAGNOSIS — J9602 Acute respiratory failure with hypercapnia: Secondary | ICD-10-CM

## 2018-12-11 DIAGNOSIS — F17200 Nicotine dependence, unspecified, uncomplicated: Secondary | ICD-10-CM

## 2018-12-11 DIAGNOSIS — J101 Influenza due to other identified influenza virus with other respiratory manifestations: Principal | ICD-10-CM

## 2018-12-11 MED ORDER — GUAIFENESIN ER 600 MG PO TB12
600.0000 mg | ORAL_TABLET | Freq: Two times a day (BID) | ORAL | Status: DC
  Administered 2018-12-11 – 2018-12-12 (×3): 600 mg via ORAL
  Filled 2018-12-11 (×3): qty 1

## 2018-12-11 NOTE — Progress Notes (Signed)
Pt ambulated on room air and O2 dropped to 82%.  Breathless,

## 2018-12-11 NOTE — Care Management Obs Status (Addendum)
[place] NOTIFICATION   Patient Details  Name: Martin Mendez MRN: [id number] Date of Birth: April 23, 1946   Medicare Observation Status Notification Given:  Yes Patient on droplet isolation with permission given by patient to sign document.   Midge Minium RN, BSN, NCM-BC, ACM-RN [phone] 12/11/2018, 4:36 PM

## 2018-12-11 NOTE — Progress Notes (Addendum)
Progress Note    Martin Mendez  HGD:[id number] DOB: Aug 01, 1946  DOA: 12/10/2018 PCP: Lavone Orn, MD    Brief Narrative:   Chief complaint: Shortness of breath and wheezing  Medical records reviewed and are as summarized below:  Martin Mendez is an 73 y.o. male with pmh PTSD and tobacco abuse; who presented with 4 to 5-day history of cough and wheezing.  Patient found to be positive for influenza A and noted to have O2 saturations as low as 78% on room air.  Assessment/Plan:   Principal Problem:   Hypoxia Active Problems:   Influenza A   Acute wheezy bronchitis   Smoker   PTSD (post-traumatic stress disorder)   1.  Influenza A with acute respiratory failure with hypoxia: Acute.  Patient found to be positive for influenza A.  He did not receive a flu vaccine this year.  At baseline patient not on oxygen and does not have inhalers at home.  Noted to have O2 saturations as low as 78% on room air in the emergency department with wheezing.  Chest x-ray showed signs of COPD without acute infiltrate. He has a significant history of smoking, but has no formal diagnosis of COPD.  Patient was put on nasal cannula oxygen with improvement.  He had had been put on bronchodilators and steroids.  On hospital day 2 with ambulation O2 sats still noted to drop to 82%. - Continue nasal cannula oxygen and wean to room air as tolerated - Incentive spirometry - Continue Tamiflu and -Complete doxycycline course for the possibility of underlying sinus infection - Continue prednisone 40 mg daily with taper - Mucinex - Ambulate patient with pulse oximetry  Tobacco abuse with COPD exacerbation: Acute.  Patient reports at least a 45-year smoking pack year history.  He has no will of wanting to quit smoking tobacco.  Chest x-ray showing COPD.  Patient was counseled on the need of cessation.  Recommended outpatient pulmonary function testing, but patient initially declined. -Treatments as seen  above  PTSD  - Continue Celexa  Hyponatremia: Acute.  Admission sodium 133. -Recheck BMP in a.m.  Body mass index is 22.17 kg/m.   Family Communication/Anticipated D/C date and plan/Code Status   DVT prophylaxis: Lovenox ordered. Code Status: Full Code.  Family Communication: No family present at bedside Disposition Plan: Possible discharge home in a.m.   Medical Consultants:    None.   Anti-Infectives:    Tamiflu and Doxycycline  Subjective:   Patient reports at least a 45 smoking pack year history, but did not get short of breath until 3 days ago.  He had not been wanting to take the doxycycline due to him being told that he had a viral illness.  Patient does not want to quit smoking, pulmonary function testing, ordered to be sent home with oxygen.  Objective:    Vitals:   12/11/18 0002 12/11/18 0325 12/11/18 0328 12/11/18 0447  BP: (!) 95/59   104/71  Pulse: 73   70  Resp: 14   16  Temp: 98.2 F (36.8 C)   97.8 F (36.6 C)  TempSrc: Oral   Oral  SpO2: 96% (!) 84% 97% 100%  Weight:      Height:        Intake/Output Summary (Last 24 hours) at 12/11/2018 1138 Last data filed at 12/11/2018 0900 Gross per 24 hour  Intake 710 ml  Output -  Net 710 ml   Filed Weights   12/10/18 1220  Weight: 70.1 kg    Exam: Constitutional: Early male in NAD, calm, comfortable Eyes: PERRL, lids and conjunctivae normal ENMT: Mucous membranes are moist. Posterior pharynx clear of any exudate or lesions.Normal dentition.  Neck: normal, supple, no masses, no thyromegaly Respiratory: Normal respiratory effort with mild expiratory wheeze appreciated.  Patient able to talk in complete sentences but currently on 2 L nasal cannula oxygen. Cardiovascular: Regular rate and rhythm, no murmurs / rubs / gallops. No extremity edema. 2+ pedal pulses. No carotid bruits.  Abdomen: no tenderness, no masses palpated. No hepatosplenomegaly. Bowel sounds positive.  Musculoskeletal: no  clubbing / cyanosis. No joint deformity upper and lower extremities. Good ROM, no contractures. Normal muscle tone.  Skin: no rashes, lesions, ulcers. No induration Neurologic: CN 2-12 grossly intact. Sensation intact, DTR normal. Strength 5/5 in all 4.  Psychiatric: Normal judgment and insight. Alert and oriented x 3. Normal mood.    Data Reviewed:   I have personally reviewed following labs and imaging studies:  Labs: Labs show the following:   Basic Metabolic Panel: Recent Labs  Lab 12/10/18 0851  NA 133*  K 4.4  CL 93*  CO2 31  GLUCOSE 98  BUN 11  CREATININE 0.85  CALCIUM 9.1   GFR Estimated Creatinine Clearance: 75.6 mL/min (by C-G formula based on SCr of 0.85 mg/dL). Liver Function Tests: No results for input(s): AST, ALT, ALKPHOS, BILITOT, PROT, ALBUMIN in the last 168 hours. No results for input(s): LIPASE, AMYLASE in the last 168 hours. No results for input(s): AMMONIA in the last 168 hours. Coagulation profile No results for input(s): INR, PROTIME in the last 168 hours.  CBC: Recent Labs  Lab 12/10/18 0851  WBC 4.5  NEUTROABS 2.5  HGB 15.9  HCT 50.5  MCV 92.7  PLT 167   Cardiac Enzymes: No results for input(s): CKTOTAL, CKMB, CKMBINDEX, TROPONINI in the last 168 hours. BNP (last 3 results) No results for input(s): PROBNP in the last 8760 hours. CBG: No results for input(s): GLUCAP in the last 168 hours. D-Dimer: No results for input(s): DDIMER in the last 72 hours. Hgb A1c: No results for input(s): HGBA1C in the last 72 hours. Lipid Profile: No results for input(s): CHOL, HDL, LDLCALC, TRIG, CHOLHDL, LDLDIRECT in the last 72 hours. Thyroid function studies: No results for input(s): TSH, T4TOTAL, T3FREE, THYROIDAB in the last 72 hours.  Invalid input(s): FREET3 Anemia work up: No results for input(s): VITAMINB12, FOLATE, FERRITIN, TIBC, IRON, RETICCTPCT in the last 72 hours. Sepsis Labs: Recent Labs  Lab 12/10/18 0851  WBC 4.5     Microbiology No results found for this or any previous visit (from the past 240 hour(s)).  Procedures and diagnostic studies:  Dg Chest Port 1 View  Result Date: 12/10/2018 CLINICAL DATA:  Shortness of breath since Saturday EXAM: PORTABLE CHEST 1 VIEW COMPARISON:  None. FINDINGS: Normal heart size. Mild aortic tortuosity. Large lung volumes with mild interstitial coarsening. There is no edema, consolidation, effusion, or pneumothorax. IMPRESSION: No acute finding. Possible COPD. Electronically Signed   By: Monte Fantasia M.D.   On: 12/10/2018 09:16    Medications:   . citalopram  20 mg Oral Daily  . doxycycline  100 mg Oral BID  . enoxaparin (LOVENOX) injection  40 mg Subcutaneous Q24H  . ipratropium-albuterol  3 mL Nebulization Q6H  . oseltamivir  75 mg Oral Daily  . predniSONE  40 mg Oral Q breakfast   Continuous Infusions:   LOS: 0 days   Martin Mendez  [facility]   *Please refer to [email or web address], password [email or web address] to get updated schedule on who will round on this patient, as hospitalists switch teams weekly. If 7PM-7AM, please contact night-coverage at [email or web address], password [email or web address] for any overnight needs.

## 2018-12-12 DIAGNOSIS — J9601 Acute respiratory failure with hypoxia: Secondary | ICD-10-CM | POA: Diagnosis present

## 2018-12-12 DIAGNOSIS — Z880 Allergy status to penicillin: Secondary | ICD-10-CM | POA: Diagnosis not present

## 2018-12-12 DIAGNOSIS — F17210 Nicotine dependence, cigarettes, uncomplicated: Secondary | ICD-10-CM | POA: Diagnosis present

## 2018-12-12 DIAGNOSIS — E871 Hypo-osmolality and hyponatremia: Secondary | ICD-10-CM | POA: Diagnosis present

## 2018-12-12 DIAGNOSIS — J101 Influenza due to other identified influenza virus with other respiratory manifestations: Secondary | ICD-10-CM | POA: Diagnosis present

## 2018-12-12 DIAGNOSIS — J440 Chronic obstructive pulmonary disease with acute lower respiratory infection: Secondary | ICD-10-CM | POA: Diagnosis present

## 2018-12-12 DIAGNOSIS — F4310 Post-traumatic stress disorder, unspecified: Secondary | ICD-10-CM | POA: Diagnosis present

## 2018-12-12 DIAGNOSIS — Z79899 Other long term (current) drug therapy: Secondary | ICD-10-CM | POA: Diagnosis not present

## 2018-12-12 DIAGNOSIS — J441 Chronic obstructive pulmonary disease with (acute) exacerbation: Secondary | ICD-10-CM

## 2018-12-12 LAB — BASIC METABOLIC PANEL
Anion gap: 8 (ref 5–15)
BUN: 14 mg/dL (ref 8–23)
CALCIUM: 8.6 mg/dL — AB (ref 8.9–10.3)
CO2: 35 mmol/L — ABNORMAL HIGH (ref 22–32)
Chloride: 94 mmol/L — ABNORMAL LOW (ref 98–111)
Creatinine, Ser: 0.8 mg/dL (ref 0.61–1.24)
GFR calc Af Amer: >60 mL/min (ref >60)
Glucose, Bld: 86 mg/dL (ref 70–99)
Potassium: 4.4 mmol/L (ref 3.5–5.1)
SODIUM: 137 mmol/L (ref 135–145)

## 2018-12-12 MED ORDER — OSELTAMIVIR PHOSPHATE 75 MG PO CAPS: 75.0000 mg | ORAL_CAPSULE | Freq: Every day | ORAL | 0 refills | Status: DC

## 2018-12-12 MED ORDER — PREDNISONE 20 MG PO TABS: ORAL_TABLET | ORAL | 0 refills | Status: DC

## 2018-12-12 MED ORDER — ALBUTEROL SULFATE HFA 108 (90 BASE) MCG/ACT IN AERS: 2.0000 | INHALATION_SPRAY | Freq: Four times a day (QID) | RESPIRATORY_TRACT | 2 refills | Status: DC | PRN

## 2018-12-12 NOTE — Care Management Note (Signed)
Case Management Note  Patient Details  Name: Martin Mendez MRN: [id number] Date of Birth: 1945-12-28  Subjective/Objective: 73 yo male presented with SOB and wheezing; found to have influenza A.                     Action/Plan: CM met with patient to discuss dispositional needs. Patient states he lives at home alone and is independent with ADLs. PCP: Lavone Orn. Patient will require home oxygen with DME preference provided; [facility] selected. DME referral given to [place], [facility] liaison; AVS updated. Patient indicated his truck is located at [facility] and he will drive himself home. No further needs from CM.   Expected Discharge Date:  12/12/18               Expected Discharge Plan:  Home/Self Care  In-House Referral:  NA  Discharge planning Services  CM Consult  Post Acute Care Choice:  Durable Medical Equipment Choice offered to:  Patient  DME Arranged:  Oxygen DME Agency:  [place]:  NA [place] Agency:  NA  Status of Service:  Completed, signed off  If discussed at [place] of Stay Meetings, dates discussed:    Additional Comments:  Midge Minium RN, BSN, NCM-BC, ACM-RN [phone] 12/12/2018, 12:54 PM

## 2018-12-12 NOTE — Progress Notes (Signed)
SATURATION QUALIFICATIONS: (This note is used to comply with regulatory documentation for home oxygen)  Patient Saturations on Room Air at Rest = 91%  Patient Saturations on Room Air while Ambulating = 77%  Patient Saturations on 3L Oxygen while ambulating =  89%  Please briefly explain why patient needs home oxygen:   Pt needs home oxygen, because he can't move around without becoming severely hypoxic.  His saturation rapidly declines to 77%, his pulse elevates to 112.

## 2018-12-12 NOTE — Progress Notes (Signed)
Ambulated pt. Saturation dropped to 77% on room air.  Pulse up to 112.  Pt wanted to sit back down and put on O2.  Pt at 91% on room air w/ sitting.

## 2018-12-12 NOTE — Discharge Summary (Signed)
Martin Mendez, is a 73 y.o. male  DOB 1946-06-04  MRN [id number].  Admission date:  12/10/2018  Admitting Physician  Barb Merino, MD  Discharge Date:  12/12/2018   Primary MD  Lavone Orn, MD  Recommendations for primary care physician for things to follow:   Pulmonary function testing   Discharge Diagnosis    Principal Problem:   Influenza A Active Problems:   Smoker   PTSD (post-traumatic stress disorder)   Hyponatremia   COPD with acute exacerbation ([place])   Acute respiratory failure with hypoxia [facility])      Past Medical History:  Diagnosis Date  . PTSD (post-traumatic stress disorder)     History reviewed. No pertinent surgical history.     HPI  from the history and physical done on the day of admission:  Martin Mendez is a 73 y.o. male with medical history significant of PTSD, smoker but no other medical problems presents to the emergency room after referral from urgent care with low oxygen.  Patient suffering from cough and wheezing for 4 to 5 days.  According to the patient, he does not take flu shot.  He started having some cough and nasal congestion starting 12/06/2018.  He went to urgent care with sinus pressure, clear drainage, cough and postnasal drip.  He had low-grade temperature.  Had a mild cough.  He was seen at urgent care and discharged on doxycycline.  His fever subsided.  His shortness of breath is gradually worsening, he has some wheezing, worse voice and now cough with productive sputum. Some chest tightness but no chest pain.  He returned to urgent care this morning with the symptoms, he was hypoxic, 78% on room air so he was directly to the ER.  Denies any chills.  Denies any chest pain.  No recent travel.  No sick contacts.  He lives by himself.  Denies any leg swelling or calf tenderness.  He  is taking doxycycline from 3 days ago.  ED Course: Hypoxic on room air with 76%.  He was generalized wheezing.  Blood pressures are stable.  Chest x-ray is normal.  EKG is normal.  Patient was tested positive for influenza A.  Was given a dose of oral steroids and nebulizer with some improvement.  Patient is a still needing 4 L of oxygen.  He really wants to go home, however due to hypoxia he needs to stay in the hospital.   Hospital Course:  1.  Influenza A with respiratory failure with hypoxia and COPD exacerbation: Acute.  Patient found to be positive for influenza A.  He did not receive a flu vaccine this year.  At baseline patient not on oxygen and does not have inhalers at home.  Noted to have O2 saturations as low as 78% on room air in the emergency department with wheezing.  Chest x-ray showed signs of COPD without acute infiltrate. He has a significant history of smoking, but had no formal diagnosis of COPD.  Patient was put on nasal cannula  oxygen with improvement.  He was started on Tamiflu, put on bronchodilators, and steroids. Given the duration of symptoms and being placed on steroids he was recommended to complete course of doxycycline for possible sinus infection.  On physical exam wheezing symptoms resolved.  On hospital day 2 and 3 patient was noted to have drops in O2 saturation into the 70-80s with ambulation.  Case was discussed with pulmonology who noted patient should qualify for home oxygen.  He was set up with home oxygen of 2 L.  Patient discharged with prescriptions for completion of Tamiflu course along with short prednisone taper.  He was recommended to have outpatient evaluation by pulmonologist along with pulmonary function testing, but was noted to decline this recommendation.  2.  Tobacco abuse :  Chronic.  Patient reports at least a 45-year smoking pack year history.  He has no will of wanting to quit smoking tobacco.  Patient was counseled on the need of cessation.    3.   PTSD : Stable.  He was continued on Celexa  4.  Hyponatremia: Resolved.  Admission sodium 133, but prior to discharge was noted to be 137 after initial fluids and treatments.     Follow UP     Consults obtained: Case discussed with Dr. Roselie Awkward of pulmonology via phone  Discharge Condition: Stable  Diet and Activity recommendation: See Discharge Instructions below  Discharge Instructions    Discharge Instructions    Diet - low sodium heart healthy   Complete by:  As directed    Discharge instructions   Complete by:  As directed    We are currently in the process of setting you up with home oxygen because of your current symptoms with shortness of breath when exerting yourself with the recent illness.  Please follow-up with your primary care provider within 1-2 weeks being discharged from the hospital as you need to have formal pulmonary function testing.  You will need to call and make this appointment. We recommend that you stop smoking tobacco.   Increase activity slowly   Complete by:  As directed         Discharge Medications     Allergies as of 12/12/2018      Reactions   Penicillins Swelling   Did it involve swelling of the face/tongue/throat, SOB, or low BP? No Did it involve sudden or severe rash/hives, skin peeling, or any reaction on the inside of your mouth or nose? No Did you need to seek medical attention at a hospital or doctor's office? No When did it last happen?96 years ago (age 16) If all above answers are "NO", may proceed with cephalosporin use. Swelling of arms and legs   Sulfa Antibiotics Rash   tongue      Medication List    TAKE these medications   albuterol 108 (90 Base) MCG/ACT inhaler Commonly known as:  PROVENTIL HFA;VENTOLIN HFA Inhale 2 puffs into the lungs every 6 (six) hours as needed for wheezing or shortness of breath.   citalopram 20 MG tablet Commonly known as:  CELEXA Take 20 mg by mouth daily.   doxycycline 100  MG capsule Commonly known as:  VIBRAMYCIN Take 1 capsule (100 mg total) by mouth 2 (two) times daily.   oseltamivir 75 MG capsule Commonly known as:  TAMIFLU Take 1 capsule (75 mg total) by mouth daily. Start taking on:  December 13, 2018   predniSONE 20 MG tablet Commonly known as:  DELTASONE Take 40 mg p.o. daily  x1 day, then take 20 mg p.o. daily x 3 days, and then stop            Durable Medical Equipment  (From admission, onward)         Start     Ordered   12/12/18 1225  DME Oxygen  Once    Comments:  COPD exacerbation with history of tobacco abuse.  Patient with O2 saturation dropping as low as 77% with ambulation.  Question Answer Comment  Mode or (Route) Nasal cannula   Liters per Minute 2   Frequency Continuous (stationary and portable oxygen unit needed)   Oxygen conserving device Yes   Oxygen delivery system Gas      12/12/18 1227   12/12/18 1137  For home use only DME oxygen  Once    Comments:  Patient with repeated drops in oxygen saturation down to 77% despite prednisone and duonebs. cxr show signs of COPD.  Question Answer Comment  Mode or (Route) Nasal cannula   Liters per Minute 2   Frequency Continuous (stationary and portable oxygen unit needed)   Oxygen delivery system Gas      12/12/18 1140          Major procedures and Radiology Reports - PLEASE review detailed and final reports for all details, in brief -    Dg Chest Port 1 View  Result Date: 12/10/2018 CLINICAL DATA:  Shortness of breath since Saturday EXAM: PORTABLE CHEST 1 VIEW COMPARISON:  None. FINDINGS: Normal heart size. Mild aortic tortuosity. Large lung volumes with mild interstitial coarsening. There is no edema, consolidation, effusion, or pneumothorax. IMPRESSION: No acute finding. Possible COPD. Electronically Signed   By: Monte Fantasia M.D.   On: 12/10/2018 09:16    Micro Results    No results found for this or any previous visit (from the past 240  hour(s)).     Today   Subjective    Nolen Mu today reports that he gets short of breath with walking long distances, but previously have been doing okay until symptoms started approximately 4 days ago. Objective   Blood pressure 126/79, pulse 86, temperature 98.4 F (36.9 C), temperature source Oral, resp. rate 12, height 5\' 10"  (1.778 m), weight 70.1 kg, SpO2 96 %.   Intake/Output Summary (Last 24 hours) at 12/12/2018 1227 Last data filed at 12/11/2018 1347 Gross per 24 hour  Intake 222 ml  Output -  Net 222 ml    Exam  Constitutional: Elderly male in NAD, calm, comfortable Eyes: PERRL, lids and conjunctivae normal ENMT: Mucous membranes are moist. Posterior pharynx clear of any exudate or lesions.Normal dentition.  Neck: normal, supple, no masses, no thyromegaly Respiratory: Prolonged expiratory phase.  Clear to auscultation bilaterally, no wheezing, no crackles. No accessory muscle use.  Currently wearing 2 L nasal cannula oxygen. Cardiovascular: Regular rate and rhythm, no murmurs / rubs / gallops. No extremity edema. 2+ pedal pulses. No carotid bruits.  Abdomen: no tenderness, no masses palpated. No hepatosplenomegaly. Bowel sounds positive.  Musculoskeletal: no clubbing / cyanosis. No joint deformity upper and lower extremities. Good ROM, no contractures. Normal muscle tone.  Skin: no rashes, lesions, ulcers. No induration Neurologic: CN 2-12 grossly intact. Sensation intact, DTR normal. Strength 5/5 in all 4.  Psychiatric: Normal judgment and insight. Alert and oriented x 3. Normal mood.    Data Review   CBC w Diff:  Lab Results  Component Value Date   WBC 4.5 12/10/2018   HGB 15.9 12/10/2018  HCT 50.5 12/10/2018   PLT 167 12/10/2018   LYMPHOPCT 30 12/10/2018   MONOPCT 14 12/10/2018   EOSPCT 0 12/10/2018   BASOPCT 0 12/10/2018    CMP:  Lab Results  Component Value Date   NA 137 12/12/2018   K 4.4 12/12/2018   CL 94 (L) 12/12/2018   CO2 35 (H)  12/12/2018   BUN 14 12/12/2018   CREATININE 0.80 12/12/2018  .   Total Time in preparing paper work, data evaluation and todays exam - 35 minutes  Norval Morton M.D on 12/12/2018 at 12:27 PM  [facility]   Office  [phone]

## 2018-12-13 DIAGNOSIS — J449 Chronic obstructive pulmonary disease, unspecified: Secondary | ICD-10-CM | POA: Diagnosis present

## 2018-12-13 DIAGNOSIS — J441 Chronic obstructive pulmonary disease with (acute) exacerbation: Secondary | ICD-10-CM | POA: Diagnosis present

## 2018-12-13 DIAGNOSIS — J9601 Acute respiratory failure with hypoxia: Secondary | ICD-10-CM | POA: Diagnosis present

## 2019-07-10 ENCOUNTER — Encounter: Payer: Self-pay | Admitting: Physician Assistant

## 2019-07-10 ENCOUNTER — Other Ambulatory Visit: Payer: Self-pay

## 2019-07-10 ENCOUNTER — Ambulatory Visit (INDEPENDENT_AMBULATORY_CARE_PROVIDER_SITE_OTHER): Payer: Medicare Other | Admitting: Physician Assistant

## 2019-07-10 DIAGNOSIS — F320 Major depressive disorder, single episode, mild: Secondary | ICD-10-CM | POA: Diagnosis not present

## 2019-07-10 DIAGNOSIS — F17200 Nicotine dependence, unspecified, uncomplicated: Secondary | ICD-10-CM | POA: Diagnosis not present

## 2019-07-10 DIAGNOSIS — F411 Generalized anxiety disorder: Secondary | ICD-10-CM | POA: Diagnosis not present

## 2019-07-10 DIAGNOSIS — F32A Depression, unspecified: Secondary | ICD-10-CM

## 2019-07-10 NOTE — Progress Notes (Signed)
[facility] Check  Patient ID: Martin Mendez,  MRN: [id number]  PCP: Lavone Orn, MD  Date of Evaluation: 07/10/2019 Time spent:15 minutes  Chief Complaint:  Chief Complaint    Anxiety; Depression; Follow-up     Virtual Visit via Telephone Note  I connected with patient by a video enabled telemedicine application or telephone, with their informed consent, and verified patient privacy and that I am speaking with the correct person using two identifiers.  I am private, in my home and the patient is home.   I discussed the limitations, risks, security and privacy concerns of performing an evaluation and management service by telephone and the availability of in person appointments. I also discussed with the patient that there may be a patient responsible charge related to this service. The patient expressed understanding and agreed to proceed.   I discussed the assessment and treatment plan with the patient. The patient was provided an opportunity to ask questions and all were answered. The patient agreed with the plan and demonstrated an understanding of the instructions.   The patient was advised to call back or seek an in-person evaluation if the symptoms worsen or if the condition fails to improve as anticipated.  I provided 15 minutes of non-face-to-face time during this encounter.  HISTORY/CURRENT STATUS: HPI For routine annual med check.  Doing well overall. He still has stress from taking care of his girlfriend's mother. He goes every day, never gets a break.  Wallie Renshaw gets anxious but when he does, he'll take a propranolol, which helps.   Patient denies loss of interest in usual activities and is able to enjoy things.  Denies decreased energy or motivation.  Appetite has not changed.  No extreme sadness, tearfulness, or feelings of hopelessness.  Denies any changes in concentration, making decisions or remembering things.  Denies suicidal or homicidal thoughts.   Denies  dizziness, syncope, seizures, numbness, tingling, tremor, tics, unsteady gait, slurred speech, confusion. Denies muscle or joint pain, stiffness, or dystonia.  Individual Medical History/ Review of Systems: Changes? :Yes  was in hospital in January w/ Influenza A.  Past medications for mental health diagnoses include: unknown  Allergies: Penicillins and Sulfa antibiotics  Current Medications:  Current Outpatient Medications:  .  citalopram (CELEXA) 20 MG tablet, Take 20 mg by mouth daily. , Disp: , Rfl:  .  propranolol (INDERAL) 10 MG tablet, Take 10 mg by mouth 4 (four) times daily as needed., Disp: , Rfl:  .  albuterol (PROVENTIL HFA;VENTOLIN HFA) 108 (90 Base) MCG/ACT inhaler, Inhale 2 puffs into the lungs every 6 (six) hours as needed for wheezing or shortness of breath. (Patient not taking: Reported on 07/10/2019), Disp: 1 Inhaler, Rfl: 2 .  doxycycline (VIBRAMYCIN) 100 MG capsule, Take 1 capsule (100 mg total) by mouth 2 (two) times daily. (Patient not taking: Reported on 07/10/2019), Disp: 20 capsule, Rfl: 0 .  oseltamivir (TAMIFLU) 75 MG capsule, Take 1 capsule (75 mg total) by mouth daily. (Patient not taking: Reported on 07/10/2019), Disp: 2 capsule, Rfl: 0 .  predniSONE (DELTASONE) 20 MG tablet, Take 40 mg p.o. daily x1 day, then take 20 mg p.o. daily x 3 days, and then stop (Patient not taking: Reported on 07/10/2019), Disp: 5 tablet, Rfl: 0 Medication Side Effects: none  Family Medical/ Social History: Changes? No  MENTAL HEALTH EXAM:  There were no vitals taken for this visit.There is no height or weight on file to calculate BMI.  General Appearance: unable to assess  Eye  Contact:  unable to assess  Speech:  Clear and Coherent  Volume:  Normal  Mood:  Euthymic  Affect:  unable to assess  Thought Process:  Goal Directed  Orientation:  Full (Time, Place, and Person)  Thought Content: Logical   Suicidal Thoughts:  No  Homicidal Thoughts:  No  Memory:  WNL  Judgement:  Good   Insight:  Good  Psychomotor Activity:  unable to assess  Concentration:  Concentration: Good  Recall:  Good  Fund of Knowledge: Good  Language: Good  Assets:  Desire for Improvement  ADL's:  Intact  Cognition: WNL  Prognosis:  Good    DIAGNOSES:    ICD-10-CM   1. 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 Generalized anxiety disorder  F41.1   3. Smoker  F17.200     Receiving Psychotherapy: No    RECOMMENDATIONS:  Discussed smoking cessation.  He said 'when I'm ready to quit, I'll just quit.' Continue Celexa 20 mg qd. Continue Propranolol 10 mg qid prn anxiety. Return in 1 year.   Donnal Moat, PA-C   This record has been created using [employer].  Chart creation errors have been sought, but may not always have been located and corrected. Such creation errors do not reflect on the standard of medical care.

## 2019-09-23 ENCOUNTER — Other Ambulatory Visit: Payer: Self-pay | Admitting: Physician Assistant

## 2019-10-10 ENCOUNTER — Observation Stay (HOSPITAL_BASED_OUTPATIENT_CLINIC_OR_DEPARTMENT_OTHER): Payer: Medicare Other

## 2019-10-10 ENCOUNTER — Other Ambulatory Visit: Payer: Self-pay

## 2019-10-10 ENCOUNTER — Emergency Department (HOSPITAL_COMMUNITY): Payer: Medicare Other

## 2019-10-10 ENCOUNTER — Observation Stay (HOSPITAL_COMMUNITY)
Admission: EM | Admit: 2019-10-10 | Discharge: 2019-10-11 | Disposition: A | Discharge status: Home / Self Care | Payer: Medicare Other | Attending: Internal Medicine | Admitting: Internal Medicine

## 2019-10-10 ENCOUNTER — Encounter (HOSPITAL_COMMUNITY): Payer: Self-pay | Admitting: Emergency Medicine

## 2019-10-10 DIAGNOSIS — Z882 Allergy status to sulfonamides status: Secondary | ICD-10-CM | POA: Diagnosis not present

## 2019-10-10 DIAGNOSIS — I361 Nonrheumatic tricuspid (valve) insufficiency: Secondary | ICD-10-CM | POA: Diagnosis not present

## 2019-10-10 DIAGNOSIS — Z6822 Body mass index (BMI) 22.0-22.9, adult: Secondary | ICD-10-CM | POA: Insufficient documentation

## 2019-10-10 DIAGNOSIS — Z79899 Other long term (current) drug therapy: Secondary | ICD-10-CM | POA: Insufficient documentation

## 2019-10-10 DIAGNOSIS — Z20828 Contact with and (suspected) exposure to other viral communicable diseases: Secondary | ICD-10-CM | POA: Diagnosis not present

## 2019-10-10 DIAGNOSIS — F17210 Nicotine dependence, cigarettes, uncomplicated: Secondary | ICD-10-CM | POA: Diagnosis not present

## 2019-10-10 DIAGNOSIS — N390 Urinary tract infection, site not specified: Secondary | ICD-10-CM | POA: Diagnosis present

## 2019-10-10 DIAGNOSIS — R634 Abnormal weight loss: Secondary | ICD-10-CM | POA: Diagnosis not present

## 2019-10-10 DIAGNOSIS — R1013 Epigastric pain: Secondary | ICD-10-CM

## 2019-10-10 DIAGNOSIS — F17200 Nicotine dependence, unspecified, uncomplicated: Secondary | ICD-10-CM

## 2019-10-10 DIAGNOSIS — F4310 Post-traumatic stress disorder, unspecified: Secondary | ICD-10-CM | POA: Insufficient documentation

## 2019-10-10 DIAGNOSIS — I4510 Unspecified right bundle-branch block: Secondary | ICD-10-CM | POA: Insufficient documentation

## 2019-10-10 DIAGNOSIS — J9601 Acute respiratory failure with hypoxia: Secondary | ICD-10-CM | POA: Diagnosis not present

## 2019-10-10 DIAGNOSIS — R55 Syncope and collapse: Principal | ICD-10-CM

## 2019-10-10 DIAGNOSIS — Z87891 Personal history of nicotine dependence: Secondary | ICD-10-CM | POA: Diagnosis present

## 2019-10-10 DIAGNOSIS — N3001 Acute cystitis with hematuria: Secondary | ICD-10-CM

## 2019-10-10 DIAGNOSIS — I454 Nonspecific intraventricular block: Secondary | ICD-10-CM

## 2019-10-10 DIAGNOSIS — Z880 Allergy status to penicillin: Secondary | ICD-10-CM | POA: Diagnosis not present

## 2019-10-10 DIAGNOSIS — N3000 Acute cystitis without hematuria: Secondary | ICD-10-CM

## 2019-10-10 LAB — TROPONIN I (HIGH SENSITIVITY)
Troponin I (High Sensitivity): 5 ng/L (ref <18)
Troponin I (High Sensitivity): 4 ng/L (ref <18)

## 2019-10-10 LAB — URINALYSIS, ROUTINE W REFLEX MICROSCOPIC
Bilirubin Urine: NEGATIVE
Glucose, UA: NEGATIVE mg/dL
Ketones, ur: NEGATIVE mg/dL
Nitrite: NEGATIVE
Protein, ur: 30 mg/dL — AB
RBC / HPF: >50 RBC/hpf — ABNORMAL HIGH (ref 0–5)
Specific Gravity, Urine: 1.012 (ref 1.005–1.030)
pH: 7 (ref 5.0–8.0)

## 2019-10-10 LAB — CBC
HCT: 49.3 % (ref 39.0–52.0)
Hemoglobin: 15.5 g/dL (ref 13.0–17.0)
MCH: 30.2 pg (ref 26.0–34.0)
MCHC: 31.4 g/dL (ref 30.0–36.0)
MCV: 95.9 fL (ref 80.0–100.0)
Platelets: 219 10*3/uL (ref 150–400)
RBC: 5.14 MIL/uL (ref 4.22–5.81)
RDW: 12.2 % (ref 11.5–15.5)
WBC: 8.4 10*3/uL (ref 4.0–10.5)
nRBC: 0 % (ref 0.0–0.2)

## 2019-10-10 LAB — BASIC METABOLIC PANEL
Anion gap: 10 (ref 5–15)
BUN: 8 mg/dL (ref 8–23)
CO2: 29 mmol/L (ref 22–32)
Calcium: 8.8 mg/dL — ABNORMAL LOW (ref 8.9–10.3)
Chloride: 98 mmol/L (ref 98–111)
Creatinine, Ser: 0.8 mg/dL (ref 0.61–1.24)
GFR calc Af Amer: >60 mL/min (ref >60)
GFR calc non Af Amer: >60 mL/min (ref >60)
Glucose, Bld: 145 mg/dL — ABNORMAL HIGH (ref 70–99)
Potassium: 4.7 mmol/L (ref 3.5–5.1)
Sodium: 137 mmol/L (ref 135–145)

## 2019-10-10 LAB — CBG MONITORING, ED: Glucose-Capillary: 132 mg/dL — ABNORMAL HIGH (ref 70–99)

## 2019-10-10 LAB — TSH: TSH: 0.88 u[IU]/mL (ref 0.350–4.500)

## 2019-10-10 LAB — SARS CORONAVIRUS 2 (TAT 6-24 HRS): SARS Coronavirus 2: NEGATIVE

## 2019-10-10 LAB — ECHOCARDIOGRAM COMPLETE
Height: 70 in
Weight: 2560 oz

## 2019-10-10 MED ORDER — ALUM & MAG HYDROXIDE-SIMETH 200-200-20 MG/5ML PO SUSP
15.0000 mL | Freq: Four times a day (QID) | ORAL | Status: DC | PRN
  Administered 2019-10-10: 15 mL via ORAL
  Filled 2019-10-10: qty 30

## 2019-10-10 MED ORDER — ACETAMINOPHEN 650 MG RE SUPP: 650.0000 mg | Freq: Four times a day (QID) | RECTAL | Status: DC | PRN

## 2019-10-10 MED ORDER — ENOXAPARIN SODIUM 40 MG/0.4ML ~~LOC~~ SOLN
40.0000 mg | SUBCUTANEOUS | Status: DC
  Filled 2019-10-10: qty 0.4

## 2019-10-10 MED ORDER — PANTOPRAZOLE SODIUM 40 MG PO TBEC
40.0000 mg | DELAYED_RELEASE_TABLET | Freq: Every day | ORAL | Status: DC
  Administered 2019-10-11: 40 mg via ORAL
  Filled 2019-10-10: qty 1

## 2019-10-10 MED ORDER — SODIUM CHLORIDE 0.9 % IV SOLN: 1.0000 g | INTRAVENOUS | Status: DC

## 2019-10-10 MED ORDER — ONDANSETRON HCL 4 MG PO TABS: 4.0000 mg | ORAL_TABLET | Freq: Four times a day (QID) | ORAL | Status: DC | PRN

## 2019-10-10 MED ORDER — ACETAMINOPHEN 325 MG PO TABS
650.0000 mg | ORAL_TABLET | Freq: Four times a day (QID) | ORAL | Status: DC | PRN
  Administered 2019-10-11: 650 mg via ORAL
  Filled 2019-10-10: qty 2

## 2019-10-10 MED ORDER — IOHEXOL 350 MG/ML SOLN
100.0000 mL | Freq: Once | INTRAVENOUS | Status: AC | PRN
  Administered 2019-10-10: 11:00:00 100 mL via INTRAVENOUS

## 2019-10-10 MED ORDER — ONDANSETRON HCL 4 MG/2ML IJ SOLN: 4.0000 mg | Freq: Four times a day (QID) | INTRAMUSCULAR | Status: DC | PRN

## 2019-10-10 MED ORDER — SODIUM CHLORIDE 0.9% FLUSH: 3.0000 mL | Freq: Once | INTRAVENOUS | Status: DC

## 2019-10-10 MED ORDER — SODIUM CHLORIDE 0.9 % IV SOLN
1.0000 g | Freq: Once | INTRAVENOUS | Status: AC
  Administered 2019-10-10: 1 g via INTRAVENOUS
  Filled 2019-10-10: qty 10

## 2019-10-10 NOTE — H&P (Addendum)
History and Physical    Martin Mendez VFI:[id number] DOB: 04/09/1946 DOA: 10/10/2019  PCP: Lavone Orn, MD  Martin Mendez coming from: Home I have personally briefly reviewed Martin Mendez's old medical records in [place]  Chief Complaint: syncope HPI: Martin Mendez is a 73 y.o. male with medical history significant of tobacco abuse, PTSD presents to emergency department for syncopal event.  Martin Mendez reports that he has epigastric pain for couple of days and he took Maalox this morning and suddenly he felt lightheaded and passed out and fell on the ground.  Martin Mendez denies head trauma, urinary, bowel incontinence seizure.  His girlfriend called EMS and brought Martin Mendez to emergency department for further evaluation.  As per EMS: Martin Mendez was found to be hypotensive and hypoxic.  He was given 500 cc of IV fluid bolus and placed on 2 L of oxygen via nasal cannula.  Martin Mendez reports that his girlfriend's mom died this morning (who was under hospice care) and he feels stressed and anxious about it.  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  He denies depressed mood, suicidal or homicidal thoughts.  He takes Celexa for PTSD.  Reports that he has lost 40 to 50 pounds since January 2020 as he was not eating right.  Has good appetite though.  No history of nausea, vomiting, melena, hematemesis, over-the-counter use of NSAIDs, night sweats.  He denies cough, congestion, wheezing, chest pain, shortness of breath, palpitation, leg swelling, dysuria, hematuria, back pain, fever, chills, lower abdominal pain, sleep changes.  ED Course: Upon arrival to ED: Martin Mendez's oxygen saturation was 87% he was placed on 2 L of oxygen via nasal cannula, initial labs such as CBC, BMP: WNL, troponin x2 -, UA positive for leukocytes and bacteria.  Chest x-ray, CT head without contrast and CT angiogram of chest came back negative for acute findings.  Martin Mendez received IV Rocephin in ED due to UTI.  Review of  Systems: As per HPI otherwise negative.    Past Medical History:  Diagnosis Date   PTSD (post-traumatic stress disorder)     History reviewed. No pertinent surgical history.   reports that he has been smoking cigarettes. He has been smoking about 1.00 pack per day. He has never used smokeless tobacco. He reports that he does not drink alcohol or use drugs.  Allergies  Allergen Reactions   Penicillins Swelling    Did it involve swelling of the face/tongue/throat, SOB, or low BP? No Did it involve sudden or severe rash/hives, skin peeling, or any reaction on the inside of your mouth or nose? No Did you need to seek medical attention at a hospital or doctor's office? No When did it last happen?57 years ago (age 65) If all above answers are NO, may proceed with cephalosporin use.  Swelling of arms and legs   Sulfa Antibiotics Rash    tongue    History reviewed. No pertinent family history.  Prior to Admission medications   Medication Sig Start Date End Date Taking? Authorizing Provider  albuterol (PROVENTIL HFA;VENTOLIN HFA) 108 (90 Base) MCG/ACT inhaler Inhale 2 puffs into the lungs every 6 (six) hours as needed for wheezing or shortness of breath. Martin Mendez not taking: Reported on 07/10/2019 12/12/18   Norval Morton, MD  citalopram (CELEXA) 20 MG tablet TAKE 1 TABLET BY MOUTH ONCE DAILY IN THE MORNING 09/23/19   Donnal Moat T, PA-C  doxycycline (VIBRAMYCIN) 100 MG capsule Take 1 capsule (100 mg total) by  mouth 2 (two) times daily. Martin Mendez not taking: Reported on 07/10/2019 12/06/18   Loura Halt A, NP  oseltamivir (TAMIFLU) 75 MG capsule Take 1 capsule (75 mg total) by mouth daily. Martin Mendez not taking: Reported on 07/10/2019 12/13/18   Norval Morton, MD  predniSONE (DELTASONE) 20 MG tablet Take 40 mg p.o. daily x1 day, then take 20 mg p.o. daily x 3 days, and then stop Martin Mendez not taking: Reported on 07/10/2019 12/12/18   Norval Morton, MD  propranolol (INDERAL) 10 MG  tablet Take 10 mg by mouth 4 (four) times daily as needed.    [provider]    Physical Exam: Vitals:   10/10/19 0945 10/10/19 1000 10/10/19 1015 10/10/19 1030  BP: 118/75 114/76 117/71 109/71  Pulse: 79 87 85 83  Resp: 17 16 17 16   Temp:      TempSrc:      SpO2: 98% 96% 96% 94%  Weight:      Height:        Constitutional: NAD, calm, comfortable Eyes: PERRL, lids and conjunctivae normal ENMT: Mucous membranes are moist. Posterior pharynx clear of any exudate or lesions.Normal dentition.  Neck: normal, supple, no masses, no thyromegaly Respiratory: clear to auscultation bilaterally, no wheezing, no crackles. Normal respiratory effort. No accessory muscle use.  Cardiovascular: Regular rate and rhythm, no murmurs / rubs / gallops. No extremity edema. 2+ pedal pulses. No carotid bruits.  Abdomen: no tenderness, no masses palpated. No hepatosplenomegaly. Bowel sounds positive.  Musculoskeletal: no clubbing / cyanosis. No joint deformity upper and lower extremities. Good ROM, no contractures. Normal muscle tone.  Skin: no rashes, lesions, ulcers. No induration Neurologic: CN 2-12 grossly intact. Sensation intact, DTR normal. Strength 5/5 in all 4.  Psychiatric: Normal judgment and insight. Alert and oriented x 3. Normal mood.    Labs on Admission: I have personally reviewed following labs and imaging studies  CBC: Recent Labs  Lab 10/10/19 0645  WBC 8.4  HGB 15.5  HCT 49.3  MCV 95.9  PLT 332   Basic Metabolic Panel: Recent Labs  Lab 10/10/19 0645  NA 137  K 4.7  CL 98  CO2 29  GLUCOSE 145*  BUN 8  CREATININE 0.80  CALCIUM 8.8*   GFR: Estimated Creatinine Clearance: 81.9 mL/min (by C-G formula based on SCr of 0.8 mg/dL). Liver Function Tests: No results for input(s): AST, ALT, ALKPHOS, BILITOT, PROT, ALBUMIN in the last 168 hours. No results for input(s): LIPASE, AMYLASE in the last 168 hours. No results for input(s): AMMONIA in the last 168  hours. Coagulation Profile: No results for input(s): INR, PROTIME in the last 168 hours. Cardiac Enzymes: No results for input(s): CKTOTAL, CKMB, CKMBINDEX, TROPONINI in the last 168 hours. BNP (last 3 results) No results for input(s): PROBNP in the last 8760 hours. HbA1C: No results for input(s): HGBA1C in the last 72 hours. CBG: Recent Labs  Lab 10/10/19 0630  GLUCAP 132*   Lipid Profile: No results for input(s): CHOL, HDL, LDLCALC, TRIG, CHOLHDL, LDLDIRECT in the last 72 hours. Thyroid Function Tests: No results for input(s): TSH, T4TOTAL, FREET4, T3FREE, THYROIDAB in the last 72 hours. Anemia Panel: No results for input(s): VITAMINB12, FOLATE, FERRITIN, TIBC, IRON, RETICCTPCT in the last 72 hours. Urine analysis:    Component Value Date/Time   COLORURINE YELLOW 10/10/2019 0909   APPEARANCEUR HAZY (A) 10/10/2019 0909   LABSPEC 1.012 10/10/2019 0909   PHURINE 7.0 10/10/2019 0909   GLUCOSEU NEGATIVE 10/10/2019 0909   HGBUR MODERATE (  A) 10/10/2019 0909   BILIRUBINUR NEGATIVE 10/10/2019 [place] 10/10/2019 0909   PROTEINUR 30 (A) 10/10/2019 0909   NITRITE NEGATIVE 10/10/2019 0909   LEUKOCYTESUR SMALL (A) 10/10/2019 0909    Radiological Exams on Admission: Ct Head Wo Contrast  Result Date: 10/10/2019 CLINICAL DATA:  Syncope. EXAM: CT HEAD WITHOUT CONTRAST TECHNIQUE: Contiguous axial images were obtained from the base of the skull through the vertex without intravenous contrast. COMPARISON:  None. FINDINGS: Brain: Ventricles, cisterns and other CSF spaces are normal. There is minimal chronic ischemic microvascular disease present. There is no mass, mass effect, shift of midline structures or acute hemorrhage. No evidence of acute infarction. Vascular: No hyperdense vessel or unexpected calcification. Skull: Normal. Negative for fracture or focal lesion. Sinuses/Orbits: Orbits are normal and symmetric. Minimal opacification over the ethmoid air cells. Paranasal  sinuses are otherwise clear. Mastoid sinuses are clear. Other: None. IMPRESSION: 1.  No acute findings. 2. 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 Electronically Signed   By: Marin Olp M.D.   On: 10/10/2019 11:01   Dg Chest Port 1 View  Result Date: 10/10/2019 CLINICAL DATA:  Cough and syncope EXAM: PORTABLE CHEST 1 VIEW COMPARISON:  12/10/2018 FINDINGS: The heart size and mediastinal contours are within normal limits. Both lungs are clear. The visualized skeletal structures are unremarkable. IMPRESSION: No active disease. Electronically Signed   By: Franchot Gallo M.D.   On: 10/10/2019 08:13   Ct Angio Chest/abd/pel For Dissection W And/or W/wo  Result Date: 10/10/2019 CLINICAL DATA:  Syncope.  Central abdominal pain beginning today. EXAM: CT ANGIOGRAPHY CHEST, ABDOMEN AND PELVIS TECHNIQUE: Multidetector CT imaging through the chest, abdomen and pelvis was performed using the standard protocol during bolus administration of intravenous contrast. Multiplanar reconstructed images and MIPs were obtained and reviewed to evaluate the vascular anatomy. CONTRAST:  160mL OMNIPAQUE IOHEXOL 350 MG/ML SOLN COMPARISON:  None. FINDINGS: CTA CHEST FINDINGS Cardiovascular: Heart is normal size. Calcified plaque over the 3 vessel coronary arteries. Thoracic aorta is normal in caliber without evidence of aneurysm or dissection. There is minimal calcified plaque over the thoracic aorta. Pulmonary arterial system is well opacified without evidence of emboli. Mediastinum/Nodes: Mediastinum is unremarkable without evidence of focal mass or adenopathy. Lungs/Pleura: Lungs are adequately inflated demonstrate moderate centrilobular emphysematous disease. No focal airspace consolidation or effusion. 3 mm nodular density over the anterior inferior right upper lobe as well as 3 mm nodule superior segment right lower lobe. Subpleural 2 mm nodule over the left upper lobe and 3 mm nodular density over  the superior segment left lower lobe. 3 mm nodule over the posterior left upper lobe. Airways are normal. Musculoskeletal: No acute findings. Mild degenerative change of the spine. Review of the MIP images confirms the above findings. CTA ABDOMEN AND PELVIS FINDINGS VASCULAR Aorta: Abdominal aorta is normal caliber without evidence of aneurysm or dissection. There is moderate calcified plaque throughout the abdominal aorta. Celiac: Normal. SMA: Normal. Renals: Renal arteries are patent. Minimal calcified plaque at the origin of the left renal artery. IMA: Normal. Inflow: No significant stenosis or occlusion. Mild calcified plaque is present. Veins: Normal. Review of the MIP images confirms the above findings. NON-VASCULAR Hepatobiliary: Several small hypodensities mostly over the dome of the liver with the largest measuring 1.3 cm likely cysts. Gallbladder and biliary tree are normal. Pancreas: Main pancreatic duct at the upper limits of normal in diameter measuring 3-4 mm. Pancreas is otherwise unremarkable. Spleen: Normal. Adrenals/Urinary Tract: Adrenal glands  are normal kidneys normal in size with moderate bilateral nephrolithiasis. There are a few small bilateral renal cortical hypodensities too small to characterize but likely cysts. No significant hydronephrosis. Minimal prominence of the lower pole right intrarenal collecting system. Which may be partly due to caliceal diverticulum. 8 mm stone over the mid aspect of the right renal pelvis. Mild prominence of the proximal and mid right ureter. No evidence of ureteral stone. Left ureter and bladder are normal. Stomach/Bowel: Stomach and small bowel are within normal. Appendix is slightly enlarged measuring 9.8 mm, although there is no significant adjacent inflammatory change or free fluid. Mild diverticulosis of the colon. Lymphatic: No adenopathy. Reproductive: Normal. Other: No free fluid. Musculoskeletal: Mild degenerative change of the spine with disc  disease at the L4-5 and L5-S1 levels. Review of the MIP images confirms the above findings. IMPRESSION: 1. No evidence of aneurysm or dissection involving the thoracoabdominal aorta. No acute findings in the chest, abdomen or pelvis. 2. Several bilateral tiny subcentimeter pulmonary nodules as described with the largest measuring 3 mm. Recommend a follow-up noncontrast chest CT in 1 year. This recommendation follows the consensus statement: Guidelines for Management of Small Pulmonary Nodules Detected on CT Scans: A Statement from the [employer] as published in Radiology 2005; 237:395-400. Online at: https://www.arnold.com/. 3. Bilateral nephrolithiasis. 8 mm stone over the right intrarenal pelvis. No significant hydronephrosis. Bilateral small renal cortical hypodensities too small to characterize but likely cysts. 4.  Several small liver hypodensities likely cysts. 5. Aortic Atherosclerosis (ICD10-I70.0) and Emphysema (ICD10-J43.9). Atherosclerotic coronary artery disease. 6.  Minimal colonic diverticulosis. Electronically Signed   By: Marin Olp M.D.   On: 10/10/2019 11:27    EKG: Sinus rhythm with right bundle branch block. Assessment/Plan Principal Problem:   Syncope Active Problems:   Smoker   PTSD (post-traumatic stress disorder)   Acute respiratory failure with hypoxia (HCC)   Epigastric pain   Syncope: -Could be secondary to orthostatic hypotension versus hypoxia? -We will admit Martin Mendez under observation, on telemetry.  EKG shows new right bundle branch block. -Reviewed chest x-ray, CT head and CT angiogram result. -CBC-BMP: WNL, troponin x2 -.  COVID-19 pending. -We will check orthostatic vitals, TSH and transthoracic echo -Consulted physical therapy.  Asymptomatic Bactiuria: -UA positive for few bacteria and small leukocytes.  Pt is asymptomatic, he is afebrile & no leukocytosis. Martin Mendez received IV Rocephin in ED,   Urine culture is is  pending. -No indication of cont. Antibiotics at this time.  Acute hypoxic respiratory failure: -Could be secondary to underlying COPD.  Martin Mendez is chronic smoker. -Currently on 2 L of oxygen via nasal cannula, chest x-ray negative, COVID-19 pending -On continuous pulse ox, will try to wean off of oxygen. -Martin Mendez is afebrile with no leukocytosis-asymptomatic, no wheezing on exam-no indication of antibiotics/steroids at this time.  Epigastric pain: Resolved. -No history of melena, hematemesis, over-the-counter use of NSAIDs. No EGD/Colonoscopy  -We will get lipase level -Start on Protonix.  Weight loss: -Martin Mendez reports that he has lost 40 to 50 pounds since January 2020.  No decreased appetite or night sweats. -Refused to see dietitian.  PTSD: We will continue Celexa.  Tobacco abuse: Counseled about cessation.  Unable to start Martin Mendez's home meds at this time as pharmacy reconciliation is pending.  DVT prophylaxis: TED/SCD/Lovenox  Code Status: Full code Family Communication: None present at bedside.  Plan of care discussed with Martin Mendez in length and he verbalized understanding and agreed with it. Disposition Plan: TBD Consults called: None Admission status:  Observation  Mckinley Jewel MD [facility] Pager [phone]  If 7PM-7AM, please contact night-coverage [email or web address] Password [facility]  10/10/2019, 12:24 PM

## 2019-10-10 NOTE — ED Notes (Signed)
Ordered Reg Tray

## 2019-10-10 NOTE — ED Triage Notes (Addendum)
Pt. From home c/o syncopal episode. At 3 am this morning Pt was up and talking with GF when he began to feel light headed and stomach began to hurt. Pt. Then found himself on the floor. Per EMS, family eased pt to the floor. No injury. No trauma. Upon EMS arrival pt was pale, A&Ox4, 89% RA, and hypotensive EMS gave 500 mL Bolus.  Denies CP/SOB. No Resp/Cardiac HX.

## 2019-10-10 NOTE — ED Notes (Signed)
Spoke with patient regarding Ct Scan and patient has now agreed to go to CT (initially refused this test)

## 2019-10-10 NOTE — ED Notes (Signed)
Pt stating at 88-89% room air placed on 2L of O2 at this time

## 2019-10-10 NOTE — Progress Notes (Signed)
  Echocardiogram 2D Echocardiogram has been performed.  Burnett Kanaris 10/10/2019, 1:38 PM

## 2019-10-10 NOTE — ED Provider Notes (Signed)
[place] EMERGENCY DEPARTMENT Provider Note   CSN: [id number] Arrival date & time: 10/10/19  7829     History   Chief Complaint Chief Complaint  Patient presents with   Loss of Consciousness    HPI Martin Mendez is a 73 y.o. male.     73yo male brought in by EMS from home for syncopal event. Patient states he has had indigestion with epigastric pain for the past few days. Patient reached up into the cabinet today to get maalox, took a dose and then felt lightheaded and passed out, falling to the floor. Patient denies any injuries from his fall, states he woke up after a few seconds and feels fine at this time. On EMS arrival, patient was found to be hypotensive (BP reading not given) and given 530mL bolus of fluids, also hypoxic and placed on O2. Patient reports having oxygen in the home from influenza in January, but has not used it since. States his normal O2 sat is 96%, denies history of COPD (COPD is listed in his chart). Denies CP, SHOB, states that he has had a mild cough which he attributes to post nasal drip. Not anticoagulated.  No other complaints or concerns.      Past Medical History:  Diagnosis Date   PTSD (post-traumatic stress disorder)     Patient Active Problem List   Diagnosis Date Noted   COPD with acute exacerbation ([place]) 12/13/2018   Acute respiratory failure with hypoxia ([place]) 12/13/2018   Hyponatremia 12/11/2018   Influenza A 12/10/2018   Acute wheezy bronchitis 12/10/2018   Hypoxia 12/10/2018   Smoker 12/10/2018   PTSD (post-traumatic stress disorder) 12/10/2018    History reviewed. No pertinent surgical history.      Home Medications    Prior to Admission medications   Medication Sig Start Date End Date Taking? Authorizing Provider  albuterol (PROVENTIL HFA;VENTOLIN HFA) 108 (90 Base) MCG/ACT inhaler Inhale 2 puffs into the lungs every 6 (six) hours as needed for wheezing or shortness of breath. Patient not  taking: Reported on 07/10/2019 12/12/18   Norval Morton, MD  citalopram (CELEXA) 20 MG tablet TAKE 1 TABLET BY MOUTH ONCE DAILY IN THE MORNING 09/23/19   Donnal Moat T, PA-C  doxycycline (VIBRAMYCIN) 100 MG capsule Take 1 capsule (100 mg total) by mouth 2 (two) times daily. Patient not taking: Reported on 07/10/2019 12/06/18   Loura Halt A, NP  oseltamivir (TAMIFLU) 75 MG capsule Take 1 capsule (75 mg total) by mouth daily. Patient not taking: Reported on 07/10/2019 12/13/18   Norval Morton, MD  predniSONE (DELTASONE) 20 MG tablet Take 40 mg p.o. daily x1 day, then take 20 mg p.o. daily x 3 days, and then stop Patient not taking: Reported on 07/10/2019 12/12/18   Norval Morton, MD  propranolol (INDERAL) 10 MG tablet Take 10 mg by mouth 4 (four) times daily as needed.    [provider]    Family History History reviewed. No pertinent family history.  Social History Social History   Tobacco Use   Smoking status: Current Every Day Smoker    Packs/day: 1.00    Types: Cigarettes   Smokeless tobacco: Never Used  Substance Use Topics   Alcohol use: No    Frequency: Never   Drug use: No     Allergies   Penicillins and Sulfa antibiotics   Review of Systems Review of Systems  Constitutional: Negative for fever.  HENT: Positive for postnasal drip.  Eyes: Negative for visual disturbance.  Respiratory: Positive for cough. Negative for shortness of breath.   Cardiovascular: Negative for chest pain.  Gastrointestinal: Positive for abdominal pain. Negative for constipation, diarrhea, nausea and vomiting.  Musculoskeletal: Negative for back pain and neck pain.  Skin: Negative for rash and wound.  Allergic/Immunologic: Negative for immunocompromised state.  Neurological: Positive for syncope and light-headedness. Negative for speech difficulty, weakness and headaches.  Hematological: Does not bruise/bleed easily.  Psychiatric/Behavioral: Negative for confusion.  All  other systems reviewed and are negative.    Physical Exam Updated Vital Signs BP 109/71    Pulse 83    Temp 97.9 F (36.6 C) (Oral)    Resp 16    Ht 5\' 10"  (1.778 m)    Wt 72.6 kg    SpO2 94%    BMI 22.96 kg/m   Physical Exam Vitals signs and nursing note reviewed.  Constitutional:      General: He is not in acute distress.    Appearance: He is well-developed. He is not diaphoretic.  HENT:     Head: Normocephalic and atraumatic.  Eyes:     Extraocular Movements: Extraocular movements intact.     Pupils: Pupils are equal, round, and reactive to light.  Cardiovascular:     Rate and Rhythm: Normal rate and regular rhythm.     Pulses: Normal pulses.     Heart sounds: Normal heart sounds.  Pulmonary:     Effort: Pulmonary effort is normal.     Breath sounds: Normal breath sounds.  Abdominal:     Palpations: Abdomen is soft.     Tenderness: There is no abdominal tenderness.  Musculoskeletal:        General: No tenderness.     Right lower leg: No edema.     Left lower leg: No edema.  Skin:    General: Skin is warm and dry.     Findings: No erythema or rash.  Neurological:     Mental Status: He is alert and oriented to person, place, and time.     Cranial Nerves: No cranial nerve deficit.     Sensory: No sensory deficit.     Motor: No weakness.  Psychiatric:        Behavior: Behavior normal.      ED Treatments / Results  Labs (all labs ordered are listed, but only abnormal results are displayed) Labs Reviewed  BASIC METABOLIC PANEL - Abnormal; Notable for the following components:      Result Value   Glucose, Bld 145 (*)    Calcium 8.8 (*)    All other components within normal limits  URINALYSIS, ROUTINE W REFLEX MICROSCOPIC - Abnormal; Notable for the following components:   APPearance HAZY (*)    Hgb urine dipstick MODERATE (*)    Protein, ur 30 (*)    Leukocytes,Ua SMALL (*)    RBC / HPF >50 (*)    Bacteria, UA FEW (*)    All other components within normal  limits  CBG MONITORING, ED - Abnormal; Notable for the following components:   Glucose-Capillary 132 (*)    All other components within normal limits  URINE CULTURE  SARS CORONAVIRUS 2 (TAT 6-24 HRS)  CBC  CBG MONITORING, ED  TROPONIN I (HIGH SENSITIVITY)  TROPONIN I (HIGH SENSITIVITY)    EKG EKG Interpretation  Date/Time:  Thursday October 10 2019 06:37:50 EST Ventricular Rate:  85 PR Interval:    QRS Duration: 130 QT Interval:  390 QTC Calculation:  464 R Axis:   98 Text Interpretation: Sinus rhythm RBBB and LPFB bundle branch blocks are new from first prior 10 December 2018 Confirmed by Pattricia Boss [phone]) on 10/10/2019 10:02:32 AM   Radiology Ct Head Wo Contrast  Result Date: 10/10/2019 CLINICAL DATA:  Syncope. EXAM: CT HEAD WITHOUT CONTRAST TECHNIQUE: Contiguous axial images were obtained from the base of the skull through the vertex without intravenous contrast. COMPARISON:  None. FINDINGS: Brain: Ventricles, cisterns and other CSF spaces are normal. There is minimal chronic ischemic microvascular disease present. There is no mass, mass effect, shift of midline structures or acute hemorrhage. No evidence of acute infarction. Vascular: No hyperdense vessel or unexpected calcification. Skull: Normal. Negative for fracture or focal lesion. Sinuses/Orbits: Orbits are normal and symmetric. Minimal opacification over the ethmoid air cells. Paranasal sinuses are otherwise clear. Mastoid sinuses are clear. Other: None. IMPRESSION: 1.  No acute findings. 2. 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 Electronically Signed   By: Marin Olp M.D.   On: 10/10/2019 11:01   Dg Chest Port 1 View  Result Date: 10/10/2019 CLINICAL DATA:  Cough and syncope EXAM: PORTABLE CHEST 1 VIEW COMPARISON:  12/10/2018 FINDINGS: The heart size and mediastinal contours are within normal limits. Both lungs are clear. The visualized skeletal structures are unremarkable. IMPRESSION:  No active disease. Electronically Signed   By: Franchot Gallo M.D.   On: 10/10/2019 08:13   Ct Angio Chest/abd/pel For Dissection W And/or W/wo  Result Date: 10/10/2019 CLINICAL DATA:  Syncope.  Central abdominal pain beginning today. EXAM: CT ANGIOGRAPHY CHEST, ABDOMEN AND PELVIS TECHNIQUE: Multidetector CT imaging through the chest, abdomen and pelvis was performed using the standard protocol during bolus administration of intravenous contrast. Multiplanar reconstructed images and MIPs were obtained and reviewed to evaluate the vascular anatomy. CONTRAST:  118mL OMNIPAQUE IOHEXOL 350 MG/ML SOLN COMPARISON:  None. FINDINGS: CTA CHEST FINDINGS Cardiovascular: Heart is normal size. Calcified plaque over the 3 vessel coronary arteries. Thoracic aorta is normal in caliber without evidence of aneurysm or dissection. There is minimal calcified plaque over the thoracic aorta. Pulmonary arterial system is well opacified without evidence of emboli. Mediastinum/Nodes: Mediastinum is unremarkable without evidence of focal mass or adenopathy. Lungs/Pleura: Lungs are adequately inflated demonstrate moderate centrilobular emphysematous disease. No focal airspace consolidation or effusion. 3 mm nodular density over the anterior inferior right upper lobe as well as 3 mm nodule superior segment right lower lobe. Subpleural 2 mm nodule over the left upper lobe and 3 mm nodular density over the superior segment left lower lobe. 3 mm nodule over the posterior left upper lobe. Airways are normal. Musculoskeletal: No acute findings. Mild degenerative change of the spine. Review of the MIP images confirms the above findings. CTA ABDOMEN AND PELVIS FINDINGS VASCULAR Aorta: Abdominal aorta is normal caliber without evidence of aneurysm or dissection. There is moderate calcified plaque throughout the abdominal aorta. Celiac: Normal. SMA: Normal. Renals: Renal arteries are patent. Minimal calcified plaque at the origin of the left renal  artery. IMA: Normal. Inflow: No significant stenosis or occlusion. Mild calcified plaque is present. Veins: Normal. Review of the MIP images confirms the above findings. NON-VASCULAR Hepatobiliary: Several small hypodensities mostly over the dome of the liver with the largest measuring 1.3 cm likely cysts. Gallbladder and biliary tree are normal. Pancreas: Main pancreatic duct at the upper limits of normal in diameter measuring 3-4 mm. Pancreas is otherwise unremarkable. Spleen: Normal. Adrenals/Urinary Tract: Adrenal glands are normal kidneys normal in size  with moderate bilateral nephrolithiasis. There are a few small bilateral renal cortical hypodensities too small to characterize but likely cysts. No significant hydronephrosis. Minimal prominence of the lower pole right intrarenal collecting system. Which may be partly due to caliceal diverticulum. 8 mm stone over the mid aspect of the right renal pelvis. Mild prominence of the proximal and mid right ureter. No evidence of ureteral stone. Left ureter and bladder are normal. Stomach/Bowel: Stomach and small bowel are within normal. Appendix is slightly enlarged measuring 9.8 mm, although there is no significant adjacent inflammatory change or free fluid. Mild diverticulosis of the colon. Lymphatic: No adenopathy. Reproductive: Normal. Other: No free fluid. Musculoskeletal: Mild degenerative change of the spine with disc disease at the L4-5 and L5-S1 levels. Review of the MIP images confirms the above findings. IMPRESSION: 1. No evidence of aneurysm or dissection involving the thoracoabdominal aorta. No acute findings in the chest, abdomen or pelvis. 2. Several bilateral tiny subcentimeter pulmonary nodules as described with the largest measuring 3 mm. Recommend a follow-up noncontrast chest CT in 1 year. This recommendation follows the consensus statement: Guidelines for Management of Small Pulmonary Nodules Detected on CT Scans: A Statement from the [place] as published in Radiology 2005; 237:395-400. Online at: https://www.arnold.com/. 3. Bilateral nephrolithiasis. 8 mm stone over the right intrarenal pelvis. No significant hydronephrosis. Bilateral small renal cortical hypodensities too small to characterize but likely cysts. 4.  Several small liver hypodensities likely cysts. 5. Aortic Atherosclerosis (ICD10-I70.0) and Emphysema (ICD10-J43.9). Atherosclerotic coronary artery disease. 6.  Minimal colonic diverticulosis. Electronically Signed   By: Marin Olp M.D.   On: 10/10/2019 11:27    Procedures Procedures (including critical care time)  Medications Ordered in ED Medications  sodium chloride flush (NS) 0.9 % injection 3 mL (3 mLs Intravenous Not Given 10/10/19 0648)  cefTRIAXone (ROCEPHIN) 1 g in sodium chloride 0.9 % 100 mL IVPB (has no administration in time range)  iohexol (OMNIPAQUE) 350 MG/ML injection 100 mL (100 mLs Intravenous Contrast Given 10/10/19 1100)     Initial Impression / Assessment and Plan / ED Course  I have reviewed the triage vital signs and the nursing notes.  Pertinent labs & imaging results that were available during my care of the patient were reviewed by me and considered in my medical decision making (see chart for details).  Clinical Course as of Oct 09 1216  Thu Oct 10, 2019  0658 73yo male brought in by EMS for syncopal episode after feeling lightheaded today. Denies injuries. Exam unremarkable, room air O2 sat currently 88%, patient placed back on [place] at 2L.   [LM]  1215 Case discussed with Dr. Jeanell Sparrow, ER attending who has seen patient, recommends admission to hospital for new EKG changes (bundle branch block) and further monitoring.  CTA is negative for dissection.  CBC, BMP, unremarkable. Troponin 5 then 4. UA with evidence of UTI, ordered Rocephin, requests cx. CT and CXR unremarkable.  Case discussed with hospitalist who will consult for admission.    [LM]      Clinical Course User Index [LM] Tacy Learn, PA-C      Final Clinical Impressions(s) / ED Diagnoses   Final diagnoses:  Syncope and collapse  Bundle branch block  Acute cystitis with hematuria    ED Discharge Orders    None       Tacy Learn, PA-C 10/10/19 1218    Pattricia Boss, MD 10/10/19 1551

## 2019-10-11 DIAGNOSIS — F4310 Post-traumatic stress disorder, unspecified: Secondary | ICD-10-CM | POA: Diagnosis not present

## 2019-10-11 DIAGNOSIS — J9601 Acute respiratory failure with hypoxia: Secondary | ICD-10-CM | POA: Diagnosis not present

## 2019-10-11 DIAGNOSIS — R1013 Epigastric pain: Secondary | ICD-10-CM | POA: Diagnosis not present

## 2019-10-11 DIAGNOSIS — R55 Syncope and collapse: Secondary | ICD-10-CM | POA: Diagnosis not present

## 2019-10-11 LAB — COMPREHENSIVE METABOLIC PANEL
ALT: 13 U/L (ref 0–44)
AST: 12 U/L — ABNORMAL LOW (ref 15–41)
Albumin: 3.2 g/dL — ABNORMAL LOW (ref 3.5–5.0)
Alkaline Phosphatase: 58 U/L (ref 38–126)
Anion gap: 10 (ref 5–15)
BUN: 7 mg/dL — ABNORMAL LOW (ref 8–23)
CO2: 37 mmol/L — ABNORMAL HIGH (ref 22–32)
Calcium: 8.8 mg/dL — ABNORMAL LOW (ref 8.9–10.3)
Chloride: 92 mmol/L — ABNORMAL LOW (ref 98–111)
Creatinine, Ser: 0.7 mg/dL (ref 0.61–1.24)
GFR calc Af Amer: >60 mL/min (ref >60)
GFR calc non Af Amer: >60 mL/min (ref >60)
Glucose, Bld: 106 mg/dL — ABNORMAL HIGH (ref 70–99)
Potassium: 4.3 mmol/L (ref 3.5–5.1)
Sodium: 139 mmol/L (ref 135–145)
Total Bilirubin: 0.4 mg/dL (ref 0.3–1.2)
Total Protein: 5.9 g/dL — ABNORMAL LOW (ref 6.5–8.1)

## 2019-10-11 LAB — CBC
HCT: 48.9 % (ref 39.0–52.0)
Hemoglobin: 15.3 g/dL (ref 13.0–17.0)
MCH: 30 pg (ref 26.0–34.0)
MCHC: 31.3 g/dL (ref 30.0–36.0)
MCV: 95.9 fL (ref 80.0–100.0)
Platelets: 221 10*3/uL (ref 150–400)
RBC: 5.1 MIL/uL (ref 4.22–5.81)
RDW: 12.3 % (ref 11.5–15.5)
WBC: 8.3 10*3/uL (ref 4.0–10.5)
nRBC: 0 % (ref 0.0–0.2)

## 2019-10-11 LAB — URINE CULTURE: Culture: NO GROWTH

## 2019-10-11 NOTE — TOC Transition Note (Signed)
Transition of Care [facility]) - CM/SW Discharge Note   Patient Details  Name: Martin Mendez MRN: [id number] Date of Birth: 11-26-1945  Transition of Care [facility] CM/SW Contact:  Bartholomew Crews, RN Phone Number: [phone] 10/11/2019, 12:08 PM   Clinical Narrative:    Spoke with patient at bedside. Patient dressed for discharge and eating lunch. No transition needs identified.    Final next level of care: Home/Self Care Barriers to Discharge: No Barriers Identified   Patient Goals and CMS Choice   CMS Medicare.gov Compare Post Acute Care list provided to:: Patient Choice offered to / list presented to : NA  Discharge Placement                       Discharge Plan and Services                DME Arranged: N/A DME Agency: NA       HH Arranged: NA HH Agency: NA        Social Determinants of Health (SDOH) Interventions     Readmission Risk Interventions No flowsheet data found.

## 2019-10-11 NOTE — Progress Notes (Signed)
PT Cancellation Note  Patient Details Name: Martin Mendez MRN: [id number] DOB: [birth year]   Cancelled Treatment:    Reason Eval/Treat Not Completed: Other (comment).  Pt is very clear that he does not feel he needs PT for mobility.  Will recheck tomorrow for clarification of need, and DC if pt continues to feel comfortable without PT.   Ramond Dial 10/11/2019, 11:38 AM   Mee Hives, PT MS Acute Rehab Dept. Number: [place] and [place]

## 2019-10-11 NOTE — Care Management Obs Status (Signed)
[place] NOTIFICATION   Patient Details  Name: Martin Mendez MRN: [id number] Date of Birth: February 01, 1946   Medicare Observation Status Notification Given:  Yes    Bartholomew Crews, RN 10/11/2019, 12:04 PM

## 2019-10-11 NOTE — Discharge Summary (Signed)
Discharge Summary  Martin Mendez CBJ:[id number] DOB: 1946-04-11  PCP: Lavone Orn, MD  Admit date: 10/10/2019 Discharge date: 10/11/2019  Time spent: 30 mins  Recommendations for Outpatient Follow-up:  1. PCP in 1 week  Discharge Diagnoses:  Active Hospital Problems   Diagnosis Date Noted   Syncope 10/10/2019   Epigastric pain 10/10/2019   Acute respiratory failure with hypoxia ([place]) 12/13/2018   Smoker 12/10/2018   PTSD (post-traumatic stress disorder) 12/10/2018    Resolved Hospital Problems   Diagnosis Date Noted Date Resolved   UTI (urinary tract infection) 10/10/2019 10/10/2019    Discharge Condition: Stable  Diet recommendation: As tolerated, heart healthy  Vitals:   10/11/19 0441 10/11/19 0928  BP: 120/70 117/67  Pulse: 76 77  Resp: 16 18  Temp: 98.1 F (36.7 C) 98.7 F (37.1 C)  SpO2: 99% 99%    History of present illness:  Martin Mendez is a 73 y.o. male with medical history significant of tobacco abuse, PTSD presents to emergency department for syncopal event.  Patient reports that he has epigastric pain for couple of days and he took Maalox this morning and suddenly he felt lightheaded and passed out and fell on the ground.  Patient denies head trauma, urinary, bowel incontinence seizure.  His girlfriend called EMS and brought patient to emergency department for further evaluation. As per EMS: Patient was found to be hypotensive and hypoxic.  He was given 500 cc of IV fluid bolus and placed on 2 L of oxygen via nasal cannula. Patient reports that his girlfriend's mom died the  morning  prior to presentation (who was under hospice care) and he feels stressed and anxious about it.  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  He denies depressed mood, suicidal or homicidal thoughts.  He takes Celexa for PTSD.  Reports that he has lost 40 to 50 pounds since January 2020 as he was not eating right.  Has good appetite though.  No  history of nausea, vomiting, melena, hematemesis, over-the-counter use of NSAIDs, night sweats.  In the ED, patient's oxygen saturation was 87% he was placed on 2 L of oxygen via nasal cannula, initial labs such as CBC, BMP: WNL, troponin x2 -, UA positive for leukocytes and bacteria.  Chest x-ray, CT head without contrast and CT angiogram of chest came back negative for acute findings.  Patient received IV Rocephin in ED due to UTI.    Today, patient denies any new complaints, reports feeling much better.  Patient denies any chest pain, shortness of breath, abdominal pain, nausea/vomiting/dizziness, fever/chills.  Patient was able to ambulate to the bathroom and back without any issues.  Patient refused to walk with physical therapy as he reports he does not feel he needs it.  Patient advised to follow-up with PCP and to quit smoking cigarettes.  Hospital Course:  Principal Problem:   Syncope Active Problems:   Smoker   PTSD (post-traumatic stress disorder)   Acute respiratory failure with hypoxia (H[facility])   Epigastric pain  Syncope Unknown etiology, orthostatic hypotension versus vasovagal Labs unremarkable, troponin negative, EKG shows right bundle branch block. Chest x-ray, CT head and CT angiogram unremarkable TSH WNL Echo showed EF of 55 to 60% Patient able to ambulate to the bathroom and back, refused physical therapy.  Asymptomatic Bactiuria Afebrile, no leukocytosis UA positive for few bacteria and small leukocytes Urine culture negative  Acute hypoxic respiratory failure Likely 2/2 undiagnosed/underlying COPD (chronic smoker) Currently  saturating well on room air Patient uses oxygen as needed at home Chest x-ray negative, COVID-19 negative Follow-up with PCP, with possible PFTs as an outpatient Continue albuterol inhaler as needed  Epigastric pain Currently pain-free Reports pain is related to food (preprandial) No history of melena, hematemesis, over-the-counter use  of NSAIDs. No EGD/Colonoscopy  Advised patient to start PPIs, patient stated he would rather buy it over-the-counter Follow-up with PCP  Weight loss Patient reports that he has lost 40 to 50 pounds since January 2020.  No decreased appetite or night sweats Imaging unremarkable Refused to see dietitian Follow-up with PCP  PTSD Continue Celexa  Tobacco abuse Advised to quit smoking, patient stated he is not ready yet, despite informing patient about all the risks about smoking tobacco Advised to reach out to his PCP or healthcare provider once he decides to quit         Malnutrition Type:      Malnutrition Characteristics:      Nutrition Interventions:      Estimated body mass index is 22.81 kg/m as calculated from the following:   Height as of this encounter: 5\' 10"  (1.778 m).   Weight as of this encounter: 72.1 kg.    Procedures:  None  Consultations:  None  Discharge Exam: BP 117/67 (BP Location: Right Arm)    Pulse 77    Temp 98.7 F (37.1 C) (Oral)    Resp 18    Ht 5\' 10"  (1.778 m)    Wt 72.1 kg    SpO2 99%    BMI 22.81 kg/m   General: NAD Cardiovascular: S1, S2 present Respiratory: Diminished breath sounds bilaterally, no wheezing heard  Discharge Instructions You were cared for by a hospitalist during your hospital stay. If you have any questions about your discharge medications or the care you received while you were in the hospital after you are discharged, you can call the unit and asked to speak with the hospitalist on call if the hospitalist that took care of you is not available. Once you are discharged, your primary care physician will handle any further medical issues. Please note that NO REFILLS for any discharge medications will be authorized once you are discharged, as it is imperative that you return to your primary care physician (or establish a relationship with a primary care physician if you do not have one) for your aftercare needs  so that they can reassess your need for medications and monitor your lab values.  Discharge Instructions    Diet - low sodium heart healthy   Complete by: As directed    Increase activity slowly   Complete by: As directed      Allergies as of 10/11/2019      Reactions   Penicillins Swelling   Did it involve swelling of the face/tongue/throat, SOB, or low BP? No Did it involve sudden or severe rash/hives, skin peeling, or any reaction on the inside of your mouth or nose? No Did you need to seek medical attention at a hospital or doctor's office? No When did it last happen?10 years ago (age 63) If all above answers are NO, may proceed with cephalosporin use. Swelling of arms and legs   Sulfa Antibiotics Rash   tongue      Medication List    STOP taking these medications   doxycycline 100 MG capsule Commonly known as: VIBRAMYCIN   oseltamivir 75 MG capsule Commonly known as: TAMIFLU   predniSONE 20 MG tablet Commonly known as:  DELTASONE     TAKE these medications   albuterol 108 (90 Base) MCG/ACT inhaler Commonly known as: VENTOLIN HFA Inhale 2 puffs into the lungs every 6 (six) hours as needed for wheezing or shortness of breath.   citalopram 20 MG tablet Commonly known as: CELEXA TAKE 1 TABLET BY MOUTH ONCE DAILY IN THE MORNING What changed: See the new instructions.   propranolol 10 MG tablet Commonly known as: INDERAL Take 10 mg by mouth 4 (four) times daily as needed.      Allergies  Allergen Reactions   Penicillins Swelling    Did it involve swelling of the face/tongue/throat, SOB, or low BP? No Did it involve sudden or severe rash/hives, skin peeling, or any reaction on the inside of your mouth or nose? No Did you need to seek medical attention at a hospital or doctor's office? No When did it last happen?60 years ago (age 73) If all above answers are NO, may proceed with cephalosporin use.  Swelling of arms and legs   Sulfa  Antibiotics Rash    tongue   Follow-up Information    Lavone Orn, MD. Schedule an appointment as soon as possible for a visit in 1 week(s).   Specialty: Internal Medicine Contact information: [address] [place] [address] [phone]            The results of significant diagnostics from this hospitalization (including imaging, microbiology, ancillary and laboratory) are listed below for reference.    Significant Diagnostic Studies: Ct Head Wo Contrast  Result Date: 10/10/2019 CLINICAL DATA:  Syncope. EXAM: CT HEAD WITHOUT CONTRAST TECHNIQUE: Contiguous axial images were obtained from the base of the skull through the vertex without intravenous contrast. COMPARISON:  None. FINDINGS: Brain: Ventricles, cisterns and other CSF spaces are normal. There is minimal chronic ischemic microvascular disease present. There is no mass, mass effect, shift of midline structures or acute hemorrhage. No evidence of acute infarction. Vascular: No hyperdense vessel or unexpected calcification. Skull: Normal. Negative for fracture or focal lesion. Sinuses/Orbits: Orbits are normal and symmetric. Minimal opacification over the ethmoid air cells. Paranasal sinuses are otherwise clear. Mastoid sinuses are clear. Other: None. IMPRESSION: 1.  No acute findings. 2. 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 Electronically Signed   By: Marin Olp M.D.   On: 10/10/2019 11:01   Dg Chest Port 1 View  Result Date: 10/10/2019 CLINICAL DATA:  Cough and syncope EXAM: PORTABLE CHEST 1 VIEW COMPARISON:  12/10/2018 FINDINGS: The heart size and mediastinal contours are within normal limits. Both lungs are clear. The visualized skeletal structures are unremarkable. IMPRESSION: No active disease. Electronically Signed   By: Franchot Gallo M.D.   On: 10/10/2019 08:13   Ct Angio Chest/abd/pel For Dissection W And/or W/wo  Result Date: 10/10/2019 CLINICAL DATA:  Syncope.   Central abdominal pain beginning today. EXAM: CT ANGIOGRAPHY CHEST, ABDOMEN AND PELVIS TECHNIQUE: Multidetector CT imaging through the chest, abdomen and pelvis was performed using the standard protocol during bolus administration of intravenous contrast. Multiplanar reconstructed images and MIPs were obtained and reviewed to evaluate the vascular anatomy. CONTRAST:  1105mL OMNIPAQUE IOHEXOL 350 MG/ML SOLN COMPARISON:  None. FINDINGS: CTA CHEST FINDINGS Cardiovascular: Heart is normal size. Calcified plaque over the 3 vessel coronary arteries. Thoracic aorta is normal in caliber without evidence of aneurysm or dissection. There is minimal calcified plaque over the thoracic aorta. Pulmonary arterial system is well opacified without evidence of emboli. Mediastinum/Nodes: Mediastinum is unremarkable without  evidence of focal mass or adenopathy. Lungs/Pleura: Lungs are adequately inflated demonstrate moderate centrilobular emphysematous disease. No focal airspace consolidation or effusion. 3 mm nodular density over the anterior inferior right upper lobe as well as 3 mm nodule superior segment right lower lobe. Subpleural 2 mm nodule over the left upper lobe and 3 mm nodular density over the superior segment left lower lobe. 3 mm nodule over the posterior left upper lobe. Airways are normal. Musculoskeletal: No acute findings. Mild degenerative change of the spine. Review of the MIP images confirms the above findings. CTA ABDOMEN AND PELVIS FINDINGS VASCULAR Aorta: Abdominal aorta is normal caliber without evidence of aneurysm or dissection. There is moderate calcified plaque throughout the abdominal aorta. Celiac: Normal. SMA: Normal. Renals: Renal arteries are patent. Minimal calcified plaque at the origin of the left renal artery. IMA: Normal. Inflow: No significant stenosis or occlusion. Mild calcified plaque is present. Veins: Normal. Review of the MIP images confirms the above findings. NON-VASCULAR Hepatobiliary:  Several small hypodensities mostly over the dome of the liver with the largest measuring 1.3 cm likely cysts. Gallbladder and biliary tree are normal. Pancreas: Main pancreatic duct at the upper limits of normal in diameter measuring 3-4 mm. Pancreas is otherwise unremarkable. Spleen: Normal. Adrenals/Urinary Tract: Adrenal glands are normal kidneys normal in size with moderate bilateral nephrolithiasis. There are a few small bilateral renal cortical hypodensities too small to characterize but likely cysts. No significant hydronephrosis. Minimal prominence of the lower pole right intrarenal collecting system. Which may be partly due to caliceal diverticulum. 8 mm stone over the mid aspect of the right renal pelvis. Mild prominence of the proximal and mid right ureter. No evidence of ureteral stone. Left ureter and bladder are normal. Stomach/Bowel: Stomach and small bowel are within normal. Appendix is slightly enlarged measuring 9.8 mm, although there is no significant adjacent inflammatory change or free fluid. Mild diverticulosis of the colon. Lymphatic: No adenopathy. Reproductive: Normal. Other: No free fluid. Musculoskeletal: Mild degenerative change of the spine with disc disease at the L4-5 and L5-S1 levels. Review of the MIP images confirms the above findings. IMPRESSION: 1. No evidence of aneurysm or dissection involving the thoracoabdominal aorta. No acute findings in the chest, abdomen or pelvis. 2. Several bilateral tiny subcentimeter pulmonary nodules as described with the largest measuring 3 mm. Recommend a follow-up noncontrast chest CT in 1 year. This recommendation follows the consensus statement: Guidelines for Management of Small Pulmonary Nodules Detected on CT Scans: A Statement from the [employer] as published in Radiology 2005; 237:395-400. Online at: https://www.arnold.com/. 3. Bilateral nephrolithiasis. 8 mm stone over the right intrarenal pelvis. No  significant hydronephrosis. Bilateral small renal cortical hypodensities too small to characterize but likely cysts. 4.  Several small liver hypodensities likely cysts. 5. Aortic Atherosclerosis (ICD10-I70.0) and Emphysema (ICD10-J43.9). Atherosclerotic coronary artery disease. 6.  Minimal colonic diverticulosis. Electronically Signed   By: Marin Olp M.D.   On: 10/10/2019 11:27    Microbiology: Recent Results (from the past 240 hour(s))  SARS CORONAVIRUS 2 (TAT 6-24 HRS) Nasopharyngeal Nasopharyngeal Swab     Status: None   Collection Time: 10/10/19 11:49 AM   Specimen: Nasopharyngeal Swab  Result Value Ref Range Status   SARS Coronavirus 2 NEGATIVE NEGATIVE Final    Comment: (NOTE) SARS-CoV-2 target nucleic acids are NOT DETECTED. The SARS-CoV-2 RNA is generally detectable in upper and lower respiratory specimens during the acute phase of infection. Negative results do not preclude SARS-CoV-2 infection, do not rule out co-infections  with other pathogens, and should not be used as the sole basis for treatment or other patient management decisions. Negative results must be combined with clinical observations, patient history, and epidemiological information. The expected result is Negative. Fact Sheet for Patients: SugarRoll.be Fact Sheet for Healthcare Providers: https://www.woods-mathews.com/ This test is not yet approved or cleared by the [place] FDA and  has been authorized for detection and/or diagnosis of SARS-CoV-2 by FDA under an Emergency Use Authorization (EUA). This EUA will remain  in effect (meaning this test can be used) for the duration of the COVID-19 declaration under Section 56 4(b)(1) of the Act, 21 U.S.C. section 360bbb-3(b)(1), unless the authorization is terminated or revoked sooner. Performed at [facility], [place] [address], [place], [place] [address]   Urine culture     Status: None   Collection Time:  10/10/19  4:14 PM   Specimen: Urine, Random  Result Value Ref Range Status   Specimen Description URINE, RANDOM  Final   Special Requests NONE  Final   Culture   Final    NO GROWTH Performed at [facility], [place] [address] [place], [place] [address]    Report Status 10/11/2019 FINAL  Final     Labs: Basic Metabolic Panel: Recent Labs  Lab 10/10/19 0645 10/11/19 0612  NA 137 139  K 4.7 4.3  CL 98 92*  CO2 29 37*  GLUCOSE 145* 106*  BUN 8 7*  CREATININE 0.80 0.70  CALCIUM 8.8* 8.8*   Liver Function Tests: Recent Labs  Lab 10/11/19 0612  AST 12*  ALT 13  ALKPHOS 58  BILITOT 0.4  PROT 5.9*  ALBUMIN 3.2*   No results for input(s): LIPASE, AMYLASE in the last 168 hours. No results for input(s): AMMONIA in the last 168 hours. CBC: Recent Labs  Lab 10/10/19 0645 10/11/19 0612  WBC 8.4 8.3  HGB 15.5 15.3  HCT 49.3 48.9  MCV 95.9 95.9  PLT 219 221   Cardiac Enzymes: No results for input(s): CKTOTAL, CKMB, CKMBINDEX, TROPONINI in the last 168 hours. BNP: BNP (last 3 results) Recent Labs    12/10/18 0851  BNP 85.0    ProBNP (last 3 results) No results for input(s): PROBNP in the last 8760 hours.  CBG: Recent Labs  Lab 10/10/19 0630  GLUCAP 132*       Signed:  Alma Friendly, MD [facility] 10/11/2019, 11:55 AM

## 2019-12-23 ENCOUNTER — Other Ambulatory Visit: Payer: Self-pay | Admitting: Physician Assistant

## 2020-07-09 ENCOUNTER — Telehealth: Payer: Self-pay | Admitting: Physician Assistant

## 2020-07-09 ENCOUNTER — Encounter: Payer: Self-pay | Admitting: Physician Assistant

## 2020-07-09 ENCOUNTER — Telehealth (INDEPENDENT_AMBULATORY_CARE_PROVIDER_SITE_OTHER): Payer: Medicare PPO | Admitting: Physician Assistant

## 2020-07-09 DIAGNOSIS — F411 Generalized anxiety disorder: Secondary | ICD-10-CM

## 2020-07-09 DIAGNOSIS — F17200 Nicotine dependence, unspecified, uncomplicated: Secondary | ICD-10-CM

## 2020-07-09 DIAGNOSIS — F3341 Major depressive disorder, recurrent, in partial remission: Secondary | ICD-10-CM

## 2020-07-09 NOTE — Telephone Encounter (Signed)
[name], pooley are scheduled for a virtual visit with your provider today.    Just as we do with appointments in the office, we must obtain your consent to participate.  Your consent will be active for this visit and any virtual visit you may have with one of our providers in the next 365 days.    If you have a MyChart account, I can also send a copy of this consent to you electronically.  All virtual visits are billed to your insurance company just like a traditional visit in the office.  As this is a virtual visit, video technology does not allow for your provider to perform a traditional examination.  This may limit your provider's ability to fully assess your condition.  If your provider identifies any concerns that need to be evaluated in person or the need to arrange testing such as labs, EKG, etc, we will make arrangements to do so.    Although advances in technology are sophisticated, we cannot ensure that it will always work on either your end or our end.  If the connection with a video visit is poor, we may have to switch to a telephone visit.  With either a video or telephone visit, we are not always able to ensure that we have a secure connection.   I need to obtain your verbal consent now.   Are you willing to proceed with your visit today?   Martin Mendez has provided verbal consent on 07/09/2020 for a virtual visit (video or telephone).   Donnal Moat, PA-C 07/09/2020  9:29 PM

## 2020-07-09 NOTE — Progress Notes (Signed)
[facility] Med Check  Patient ID: Martin Mendez,  MRN: [id number]  PCP: Lavone Orn, MD  Date of Evaluation: 07/09/2020 Time spent:20 minutes  Chief Complaint:  Chief Complaint    Medication Refill     Virtual Visit via Telehealth  I connected with patient by telephone, with their informed consent, and verified patient privacy and that I am speaking with the correct person using two identifiers.  I am private, in my office and the patient is at home.  I discussed the limitations, risks, security and privacy concerns of performing an evaluation and management service by telephone and the availability of in person appointments. I also discussed with the patient that there may be a patient responsible charge related to this service. The patient expressed understanding and agreed to proceed.   I discussed the assessment and treatment plan with the patient. The patient was provided an opportunity to ask questions and all were answered. The patient agreed with the plan and demonstrated an understanding of the instructions.   The patient was advised to call back or seek an in-person evaluation if the symptoms worsen or if the condition fails to improve as anticipated.  I provided 20 minutes of non-face-to-face time during this encounter.  HISTORY/CURRENT STATUS: HPI For routine annual med check.  His girlfriend's mother died last Thanksgiving Day. He had been helping take care of her daily for years.  It has been sad but he's doing ok.   Patient denies loss of interest in usual activities and is able to enjoy things.  Denies decreased energy or motivation.  Appetite has not changed.  No extreme sadness, tearfulness, or feelings of hopelessness.  Denies any changes in concentration, making decisions or remembering things.  Denies suicidal or homicidal thoughts.   Rarely uses the propranolol but it does help when he feels kind of anxious.  He'll have a panic attack every once in awhile  but not often. The propranolol does help. No SE from it.   He's still smoking but a lot less, only about 1/2 pack per day now, and doesn't even smoking the whole cigarette. So he's doing much better with that.   Denies dizziness, syncope, seizures, numbness, tingling, tremor, tics, unsteady gait, slurred speech, confusion. Denies muscle or joint pain, stiffness, or dystonia.  Individual Medical History/ Review of Systems: Changes? :No    Past medications for mental health diagnoses include: unknown  Allergies: Penicillins and Sulfa antibiotics  Current Medications:  Current Outpatient Medications:  .  acetaminophen (TYLENOL) 500 MG tablet, Take 500 mg by mouth daily., Disp: , Rfl:  .  citalopram (CELEXA) 20 MG tablet, TAKE 1 TABLET BY MOUTH ONCE DAILY IN THE MORNING (Patient taking differently: Take 20 mg by mouth daily. ), Disp: 90 tablet, Rfl: 3 .  propranolol (INDERAL) 10 MG tablet, TAKE 1 TABLET BY MOUTH ONCE DAILY AS NEEDED, Disp: 30 tablet, Rfl: 0 .  loratadine (CLARITIN) 10 MG tablet, Take 10 mg by mouth daily. (Patient not taking: Reported on 07/09/2020), Disp: , Rfl:  Medication Side Effects: none  Family Medical/ Social History: Changes? No  MENTAL HEALTH EXAM:  There were no vitals taken for this visit.There is no height or weight on file to calculate BMI.  General Appearance: unable to assess  Eye Contact:  unable to assess  Speech:  Clear and Coherent and Normal Rate  Volume:  Normal  Mood:  Euthymic  Affect:  unable to assess  Thought Process:  Goal Directed  Orientation:  Full (  Time, Place, and Person)  Thought Content: Logical   Suicidal Thoughts:  No  Homicidal Thoughts:  No  Memory:  WNL  Judgement:  Good  Insight:  Good  Psychomotor Activity:  unable to assess  Concentration:  Concentration: Good and Attention Span: Good  Recall:  Good  Fund of Knowledge: Good  Language: Good  Assets:  Desire for Improvement  ADL's:  Intact  Cognition: WNL  Prognosis:   Good    DIAGNOSES:    ICD-10-CM   1. Generalized anxiety disorder  F41.1   2. Smoker  F17.200   3. 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 I provided 20 minutes of non face to face time during this encounter.  Continue Celexa 20 mg qd. Continue Propranolol 10 mg qid prn anxiety. Return in 1 year.   Donnal Moat, PA-C

## 2020-09-17 ENCOUNTER — Other Ambulatory Visit: Payer: Self-pay | Admitting: Physician Assistant

## 2021-06-05 ENCOUNTER — Inpatient Hospital Stay (HOSPITAL_COMMUNITY)
Admission: EM | Admit: 2021-06-05 | Discharge: 2021-06-12 | DRG: 356 | Disposition: A | Discharge status: Home / Self Care | Payer: Medicare PPO | Attending: Internal Medicine | Admitting: Internal Medicine

## 2021-06-05 ENCOUNTER — Encounter (HOSPITAL_COMMUNITY): Payer: Self-pay | Admitting: Emergency Medicine

## 2021-06-05 ENCOUNTER — Emergency Department (HOSPITAL_COMMUNITY): Payer: Medicare PPO

## 2021-06-05 ENCOUNTER — Observation Stay (HOSPITAL_COMMUNITY): Payer: Medicare PPO | Admitting: Certified Registered Nurse Anesthetist

## 2021-06-05 ENCOUNTER — Encounter (HOSPITAL_COMMUNITY)
Admission: EM | Disposition: A | Discharge status: Home / Self Care | Payer: Self-pay | Source: Home / Self Care | Attending: Internal Medicine

## 2021-06-05 ENCOUNTER — Other Ambulatory Visit: Payer: Self-pay

## 2021-06-05 DIAGNOSIS — K264 Chronic or unspecified duodenal ulcer with hemorrhage: Secondary | ICD-10-CM | POA: Diagnosis not present

## 2021-06-05 DIAGNOSIS — I639 Cerebral infarction, unspecified: Secondary | ICD-10-CM | POA: Diagnosis not present

## 2021-06-05 DIAGNOSIS — Z823 Family history of stroke: Secondary | ICD-10-CM

## 2021-06-05 DIAGNOSIS — K922 Gastrointestinal hemorrhage, unspecified: Secondary | ICD-10-CM | POA: Diagnosis present

## 2021-06-05 DIAGNOSIS — F4310 Post-traumatic stress disorder, unspecified: Secondary | ICD-10-CM | POA: Diagnosis present

## 2021-06-05 DIAGNOSIS — J439 Emphysema, unspecified: Secondary | ICD-10-CM | POA: Diagnosis present

## 2021-06-05 DIAGNOSIS — I6340 Cerebral infarction due to embolism of unspecified cerebral artery: Secondary | ICD-10-CM | POA: Insufficient documentation

## 2021-06-05 DIAGNOSIS — G4733 Obstructive sleep apnea (adult) (pediatric): Secondary | ICD-10-CM | POA: Diagnosis present

## 2021-06-05 DIAGNOSIS — F17210 Nicotine dependence, cigarettes, uncomplicated: Secondary | ICD-10-CM | POA: Diagnosis present

## 2021-06-05 DIAGNOSIS — A048 Other specified bacterial intestinal infections: Secondary | ICD-10-CM | POA: Diagnosis present

## 2021-06-05 DIAGNOSIS — E785 Hyperlipidemia, unspecified: Secondary | ICD-10-CM | POA: Diagnosis present

## 2021-06-05 DIAGNOSIS — J9622 Acute and chronic respiratory failure with hypercapnia: Secondary | ICD-10-CM | POA: Diagnosis present

## 2021-06-05 DIAGNOSIS — K2970 Gastritis, unspecified, without bleeding: Secondary | ICD-10-CM | POA: Diagnosis present

## 2021-06-05 DIAGNOSIS — Z882 Allergy status to sulfonamides status: Secondary | ICD-10-CM

## 2021-06-05 DIAGNOSIS — R55 Syncope and collapse: Secondary | ICD-10-CM | POA: Diagnosis not present

## 2021-06-05 DIAGNOSIS — D62 Acute posthemorrhagic anemia: Secondary | ICD-10-CM | POA: Diagnosis not present

## 2021-06-05 DIAGNOSIS — K2980 Duodenitis without bleeding: Secondary | ICD-10-CM | POA: Diagnosis present

## 2021-06-05 DIAGNOSIS — Z20822 Contact with and (suspected) exposure to covid-19: Secondary | ICD-10-CM | POA: Diagnosis present

## 2021-06-05 DIAGNOSIS — Z79899 Other long term (current) drug therapy: Secondary | ICD-10-CM

## 2021-06-05 DIAGNOSIS — Z23 Encounter for immunization: Secondary | ICD-10-CM

## 2021-06-05 DIAGNOSIS — Z880 Allergy status to penicillin: Secondary | ICD-10-CM

## 2021-06-05 DIAGNOSIS — I959 Hypotension, unspecified: Secondary | ICD-10-CM | POA: Diagnosis present

## 2021-06-05 DIAGNOSIS — E872 Acidosis: Secondary | ICD-10-CM | POA: Diagnosis present

## 2021-06-05 DIAGNOSIS — I63413 Cerebral infarction due to embolism of bilateral middle cerebral arteries: Secondary | ICD-10-CM

## 2021-06-05 DIAGNOSIS — R4701 Aphasia: Secondary | ICD-10-CM | POA: Diagnosis not present

## 2021-06-05 HISTORY — PX: ESOPHAGOGASTRODUODENOSCOPY (EGD) WITH PROPOFOL: SHX5813

## 2021-06-05 LAB — COMPREHENSIVE METABOLIC PANEL
ALT: 10 U/L (ref 0–44)
AST: 15 U/L (ref 15–41)
Albumin: 2.7 g/dL — ABNORMAL LOW (ref 3.5–5.0)
Alkaline Phosphatase: 41 U/L (ref 38–126)
Anion gap: 5 (ref 5–15)
BUN: 22 mg/dL (ref 8–23)
CO2: 31 mmol/L (ref 22–32)
Calcium: 7.9 mg/dL — ABNORMAL LOW (ref 8.9–10.3)
Chloride: 103 mmol/L (ref 98–111)
Creatinine, Ser: 0.77 mg/dL (ref 0.61–1.24)
GFR, Estimated: >60 mL/min (ref >60)
Glucose, Bld: 90 mg/dL (ref 70–99)
Potassium: 4 mmol/L (ref 3.5–5.1)
Sodium: 139 mmol/L (ref 135–145)
Total Bilirubin: 0.5 mg/dL (ref 0.3–1.2)
Total Protein: 4.8 g/dL — ABNORMAL LOW (ref 6.5–8.1)

## 2021-06-05 LAB — I-STAT BETA HCG BLOOD, ED (MC, WL, AP ONLY): I-stat hCG, quantitative: <5 m[IU]/mL (ref <5)

## 2021-06-05 LAB — CBC WITH DIFFERENTIAL/PLATELET
Abs Immature Granulocytes: 0.05 10*3/uL (ref 0.00–0.07)
Basophils Absolute: 0.1 10*3/uL (ref 0.0–0.1)
Basophils Relative: 1 %
Eosinophils Absolute: 0.3 10*3/uL (ref 0.0–0.5)
Eosinophils Relative: 3 %
HCT: 40.5 % (ref 39.0–52.0)
Hemoglobin: 12.9 g/dL — ABNORMAL LOW (ref 13.0–17.0)
Immature Granulocytes: 1 %
Lymphocytes Relative: 19 %
Lymphs Abs: 1.9 10*3/uL (ref 0.7–4.0)
MCH: 30.9 pg (ref 26.0–34.0)
MCHC: 31.9 g/dL (ref 30.0–36.0)
MCV: 96.9 fL (ref 80.0–100.0)
Monocytes Absolute: 0.6 10*3/uL (ref 0.1–1.0)
Monocytes Relative: 6 %
Neutro Abs: 7.5 10*3/uL (ref 1.7–7.7)
Neutrophils Relative %: 70 %
Platelets: 240 10*3/uL (ref 150–400)
RBC: 4.18 MIL/uL — ABNORMAL LOW (ref 4.22–5.81)
RDW: 13 % (ref 11.5–15.5)
WBC: 10.5 10*3/uL (ref 4.0–10.5)
nRBC: 0 % (ref 0.0–0.2)

## 2021-06-05 LAB — PREPARE RBC (CROSSMATCH)

## 2021-06-05 LAB — POC OCCULT BLOOD, ED: Fecal Occult Bld: POSITIVE — AB

## 2021-06-05 LAB — RESP PANEL BY RT-PCR (FLU A&B, COVID) ARPGX2
Influenza A by PCR: NEGATIVE
Influenza B by PCR: NEGATIVE
SARS Coronavirus 2 by RT PCR: NEGATIVE

## 2021-06-05 LAB — HEMOGLOBIN AND HEMATOCRIT, BLOOD
HCT: 43.3 % (ref 39.0–52.0)
Hemoglobin: 13.8 g/dL (ref 13.0–17.0)

## 2021-06-05 SURGERY — ESOPHAGOGASTRODUODENOSCOPY (EGD) WITH PROPOFOL
Anesthesia: Monitor Anesthesia Care

## 2021-06-05 MED ORDER — SODIUM CHLORIDE 0.9 % IV BOLUS
500.0000 mL | Freq: Once | INTRAVENOUS | Status: AC
  Administered 2021-06-05: 500 mL via INTRAVENOUS

## 2021-06-05 MED ORDER — ONDANSETRON HCL 4 MG/2ML IJ SOLN
4.0000 mg | Freq: Once | INTRAMUSCULAR | Status: AC
  Administered 2021-06-05: 4 mg via INTRAVENOUS
  Filled 2021-06-05: qty 2

## 2021-06-05 MED ORDER — PROPOFOL 500 MG/50ML IV EMUL
INTRAVENOUS | Status: DC | PRN
  Administered 2021-06-05: 125 ug/kg/min via INTRAVENOUS

## 2021-06-05 MED ORDER — LACTATED RINGERS IV SOLN: INTRAVENOUS | Status: DC | PRN

## 2021-06-05 MED ORDER — PNEUMOCOCCAL VAC POLYVALENT 25 MCG/0.5ML IJ INJ
0.5000 mL | INJECTION | INTRAMUSCULAR | Status: DC
Start: 2021-06-06 — End: 2021-06-12
  Filled 2021-06-05: qty 0.5

## 2021-06-05 MED ORDER — SODIUM CHLORIDE 0.9 % IV SOLN: INTRAVENOUS | Status: DC

## 2021-06-05 MED ORDER — COVID-19 MRNA VAC-TRIS(PFIZER) 30 MCG/0.3ML IM SUSP
0.3000 mL | Freq: Once | INTRAMUSCULAR | Status: AC
  Administered 2021-06-06: 0.3 mL via INTRAMUSCULAR
  Filled 2021-06-05: qty 0.3

## 2021-06-05 MED ORDER — PHENYLEPHRINE 40 MCG/ML (10ML) SYRINGE FOR IV PUSH (FOR BLOOD PRESSURE SUPPORT)
PREFILLED_SYRINGE | INTRAVENOUS | Status: DC | PRN
  Administered 2021-06-05 (×2): 80 ug via INTRAVENOUS

## 2021-06-05 MED ORDER — PROPOFOL 10 MG/ML IV BOLUS
INTRAVENOUS | Status: DC | PRN
  Administered 2021-06-05: 20 mg via INTRAVENOUS

## 2021-06-05 MED ORDER — ALBUMIN HUMAN 5 % IV SOLN: INTRAVENOUS | Status: DC | PRN

## 2021-06-05 MED ORDER — PANTOPRAZOLE INFUSION (NEW) - SIMPLE MED
8.0000 mg/h | INTRAVENOUS | Status: DC
  Administered 2021-06-05 – 2021-06-06 (×4): 8 mg/h via INTRAVENOUS
  Filled 2021-06-05 (×2): qty 80
  Filled 2021-06-05: qty 100
  Filled 2021-06-05 (×2): qty 80

## 2021-06-05 MED ORDER — PANTOPRAZOLE 80MG IVPB - SIMPLE MED
80.0000 mg | Freq: Once | INTRAVENOUS | Status: AC
  Administered 2021-06-05: 80 mg via INTRAVENOUS
  Filled 2021-06-05: qty 80

## 2021-06-05 MED ORDER — SODIUM CHLORIDE 0.9% IV SOLUTION: Freq: Once | INTRAVENOUS | Status: AC

## 2021-06-05 MED ORDER — PANTOPRAZOLE SODIUM 40 MG IV SOLR: 40.0000 mg | Freq: Two times a day (BID) | INTRAVENOUS | Status: DC

## 2021-06-05 SURGICAL SUPPLY — 15 items

## 2021-06-05 NOTE — Transfer of Care (Signed)
Immediate Anesthesia Transfer of Care Note  Patient: Martin Mendez  Procedure(s) Performed: ESOPHAGOGASTRODUODENOSCOPY (EGD) WITH PROPOFOL  Patient Location: Endoscopy Unit  Anesthesia Type:MAC  Level of Consciousness: sedated  Airway & Oxygen Therapy: Patient Spontanous Breathing and Patient connected to nasal cannula oxygen  Post-op Assessment: Report given to RN and Post -op Vital signs reviewed and stable  Post vital signs: Reviewed and stable  Last Vitals:  Vitals Value Taken Time  BP 96/32 06/05/21 1402  Temp    Pulse 99 06/05/21 1402  Resp 15 06/05/21 1402  SpO2 100 % 06/05/21 1402  Vitals shown include unvalidated device data.  Last Pain:  Vitals:   06/05/21 1335  TempSrc: Axillary  PainSc:          Complications: No notable events documented.

## 2021-06-05 NOTE — Anesthesia Preprocedure Evaluation (Signed)
Anesthesia Evaluation  Patient identified by MRN, date of birth, ID band Patient awake    Reviewed: Allergy & Precautions, NPO status , Patient's Chart, lab work & pertinent test results  Airway Mallampati: II  TM Distance: >3 FB Neck ROM: Full    Dental   Pulmonary COPD, Current Smoker,    breath sounds clear to auscultation       Cardiovascular negative cardio ROS   Rhythm:Regular Rate:Normal     Neuro/Psych negative neurological ROS     GI/Hepatic negative GI ROS, Neg liver ROS,   Endo/Other  negative endocrine ROS  Renal/GU negative Renal ROS     Musculoskeletal   Abdominal   Peds  Hematology  (+) anemia ,   Anesthesia Other Findings   Reproductive/Obstetrics                             Anesthesia Physical Anesthesia Plan  ASA: 3  Anesthesia Plan: MAC   Post-op Pain Management:    Induction:   PONV Risk Score and Plan: Propofol infusion and Treatment may vary due to age or medical condition  Airway Management Planned: Natural Airway and Nasal Cannula  Additional Equipment:   Intra-op Plan:   Post-operative Plan:   Informed Consent: I have reviewed the patients History and Physical, chart, labs and discussed the procedure including the risks, benefits and alternatives for the proposed anesthesia with the patient or authorized representative who has indicated his/her understanding and acceptance.       Plan Discussed with:   Anesthesia Plan Comments:         Anesthesia Quick Evaluation

## 2021-06-05 NOTE — Interval H&P Note (Signed)
History and Physical Interval Note:  06/05/2021 1:36 PM  Martin Mendez  has presented today for surgery, with the diagnosis of GI bleed.  The various methods of treatment have been discussed with the patient and family. After consideration of risks, benefits and other options for treatment, the patient has consented to  Procedure(s): ESOPHAGOGASTRODUODENOSCOPY (EGD) WITH PROPOFOL (N/A) as a surgical intervention.  The patient's history has been reviewed, patient examined, no change in status, stable for surgery.  I have reviewed the patient's chart and labs.  Questions were answered to the patient's satisfaction.     Landry Dyke

## 2021-06-05 NOTE — Op Note (Signed)
[facility] Patient Name: Martin Mendez Procedure Date : 06/05/2021 MRN: [id number] Attending MD: [name] , MD Date of Birth: 15-Jun-1946 CSN: [id number] Age: 75 Admit Type: Inpatient Procedure:                Upper GI endoscopy Indications:              Hematemesis, Melena Providers:                [name], MD, Jeanella Cara, RN,                            Tyrone Apple, Technician, Clearnce Sorrel, CRNA Referring MD:             [facility] Medicines:                Monitored Anesthesia Care Complications:            No immediate complications. Estimated Blood Loss:     Estimated blood loss: none. Procedure:                Pre-Anesthesia Assessment:                           - Prior to the procedure, a History and Physical                            was performed, and patient medications and                            allergies were reviewed. The patient's tolerance of                            previous anesthesia was also reviewed. The risks                            and benefits of the procedure and the sedation                            options and risks were discussed with the patient.                            All questions were answered, and informed consent                            was obtained. Prior Anticoagulants: The patient has                            taken no previous anticoagulant or antiplatelet                            agents. ASA Grade Assessment: III - A patient with                            severe systemic disease. After reviewing the risks  and benefits, the patient was deemed in                            satisfactory condition to undergo the procedure.                           After obtaining informed consent, the endoscope was                            passed under direct vision. Throughout the                            procedure, the patient's blood pressure, pulse, and                             oxygen saturations were monitored continuously. The                            [id number]-[id number] ([id number]) Olympus endoscope was introduced                            through the mouth, and advanced to the second part                            of duodenum. The upper GI endoscopy was                            accomplished without difficulty. The patient                            tolerated the procedure well. Scope In: Scope Out: Findings:      The examined esophagus was normal.      Patchy mild inflammation was found in the gastric body and in the       gastric antrum.      The exam of the stomach was otherwise normal.      Diffuse moderate inflammation characterized by congestion (edema),       erythema and friability was found in the duodenal bulb and in the first       portion of the duodenum.      One large non-bleeding cratered duodenal ulcer with no stigmata of       bleeding was found in the duodenal bulb. The lesion was 15 mm in largest       dimension.      The exam of the duodenum was otherwise normal.      No old or fresh blood was seen to the extent of our examination. Impression:               - Normal esophagus.                           - Gastritis.                           - Duodenitis.                           -  Non-bleeding duodenal ulcer with no stigmata of                            bleeding.                           - No old or fresh blood seen to the extent of our                            examination. Recommendation:           - Return patient to hospital ward for ongoing care.                           - Clear liquid diet today.                           - Continue present medications.                           - No NSAIDs.                           - Perform an H. pylori serology tomorrow.                           [name] will follow. When/if further rampant                            bleeding, would likely consider IR-mediated                             embolization of gastroduodenal artery. Procedure Code(s):        --- Professional ---                           [phone], Esophagogastroduodenoscopy, flexible,                            transoral; diagnostic, including collection of                            specimen(s) by brushing or washing, when performed                            (separate procedure) Diagnosis Code(s):        --- Professional ---                           K29.70, Gastritis, unspecified, without bleeding                           K29.80, Duodenitis without bleeding                           K26.9, Duodenal ulcer, unspecified as acute or  chronic, without hemorrhage or perforation                           K92.0, Hematemesis                           K92.1, Melena (includes Hematochezia) CPT copyright 2019 American Medical Association. All rights reserved. The codes documented in this report are preliminary and upon coder review may  be revised to meet current compliance requirements. [name], MD 06/05/2021 2:08:00 PM This report has been signed electronically. Number of Addenda: 0

## 2021-06-05 NOTE — H&P (View-Only) (Signed)
[facility] Consultation Note  Referring Provider: [facility] Primary Care Physician:  Martin Orn, MD  Reason for Consultation:    HPI: Martin Mendez is a 75 y.o. male presenting one day history of hematemesis, (pre)syncope, melena.  No abdominal pain.  No prior GI bleeding.  Denies NSAIDs or anticoagulants.   Past Medical History:  Diagnosis Date   PTSD (post-traumatic stress disorder)     History reviewed. No pertinent surgical history.  Prior to Admission medications   Medication Sig Start Date End Date Taking? Authorizing Provider  acetaminophen (TYLENOL) 500 MG tablet Take 500 mg by mouth daily.   Yes [provider]  citalopram (CELEXA) 20 MG tablet TAKE 1 TABLET BY MOUTH ONCE DAILY IN THE MORNING Patient taking differently: Take 20 mg by mouth daily. 09/18/20  Yes Martin Mendez T, PA-C  loratadine (CLARITIN) 10 MG tablet Take 10 mg by mouth daily.   Yes [provider]  propranolol (INDERAL) 10 MG tablet TAKE 1 TABLET BY MOUTH ONCE DAILY AS NEEDED Patient taking differently: Take 10 mg by mouth daily as needed (anxiety). 12/23/19  Yes Martin Mendez T, PA-C    Current Facility-Administered Medications  Medication Dose Route Frequency Provider Last Rate Last Admin   [START ON 06/08/2021] pantoprazole (PROTONIX) injection 40 mg  40 mg Intravenous Q12H Martin Patience, MD       pantoprozole (PROTONIX) 80 mg /NS 100 mL infusion  8 mg/hr Intravenous Continuous Martin Patience, MD 10 mL/hr at 06/05/21 0559 8 mg/hr at 06/05/21 0559   Current Outpatient Medications  Medication Sig Dispense Refill   acetaminophen (TYLENOL) 500 MG tablet Take 500 mg by mouth daily.     citalopram (CELEXA) 20 MG tablet TAKE 1 TABLET BY MOUTH ONCE DAILY IN THE MORNING (Patient taking differently: Take 20 mg by mouth daily.) 90 tablet 2   loratadine (CLARITIN) 10 MG tablet Take 10 mg by mouth daily.     propranolol (INDERAL) 10 MG tablet TAKE 1 TABLET BY  MOUTH ONCE DAILY AS NEEDED (Patient taking differently: Take 10 mg by mouth daily as needed (anxiety).) 30 tablet 0    Allergies as of 06/05/2021 - Review Complete 06/05/2021  Allergen Reaction Noted   Penicillins Swelling 11/23/2017   Sulfa antibiotics Rash 11/23/2017    History reviewed. No pertinent family history.  Social History   Socioeconomic History   Marital status: Single    Spouse name: Not on file   Number of children: Not on file   Years of education: Not on file   Highest education level: Not on file  Occupational History   Not on file  Tobacco Use   Smoking status: Every Day    Packs/day: 0.50    Types: Cigarettes   Smokeless tobacco: Never  Substance and Sexual Activity   Alcohol use: No   Drug use: No   Sexual activity: Not on file  Other Topics Concern   Not on file  Social History Narrative   Not on file   Social Determinants of Health   Financial Resource Strain: Not on file  Food Insecurity: Not on file  Transportation Needs: Not on file  Physical Activity: Not on file  Stress: Not on file  Social Connections: Not on file  Intimate Partner Violence: Not on file    Review of Systems: As per HPI, all others negative  Physical Exam: Vital signs in last 24 hours: Temp:  [97.6 F (36.4 C)-97.8 F (36.6 C)] 97.7 F (36.5 C) (  07/23 1058) Pulse Rate:  [78-95] 87 (07/23 1300) Resp:  [14-25] 16 (07/23 1300) BP: (86-113)/(54-72) 102/67 (07/23 1300) SpO2:  [90 %-100 %] 100 % (07/23 1300) Weight:  [72.6 kg] 72.6 kg (07/23 0251)   General:   Alert,  Well-developed, well-nourished, pleasant and cooperative in NAD Head:  Normocephalic and atraumatic. Eyes:  Sclera clear, no icterus.   Conjunctiva pink. Ears:  Normal auditory acuity. Nose:  No deformity, discharge,  or lesions. Mouth:  No deformity or lesions.  Oropharynx pink & moist. Neck:  Supple; no masses or thyromegaly. Heart:  Regular rate and rhythm; no murmurs, clicks, rubs,  or  gallops. Abdomen:  Soft, nontender and nondistended. No masses, hepatosplenomegaly or hernias noted. Normal bowel sounds, without guarding, and without rebound.     Msk:  Symmetrical without gross deformities. Normal posture. Pulses:  Normal pulses noted. Extremities:  Without clubbing or edema. Neurologic:  Alert and  oriented x4;  grossly normal neurologically. Skin:  Intact without significant lesions or rashes. Psych:  Alert and cooperative. Normal mood and affect.   Lab Results: Recent Labs    06/05/21 0321  WBC 10.5  HGB 12.9*  HCT 40.5  PLT 240   BMET Recent Labs    06/05/21 0321  NA 139  K 4.0  CL 103  CO2 31  GLUCOSE 90  BUN 22  CREATININE 0.77  CALCIUM 7.9*   LFT Recent Labs    06/05/21 0321  PROT 4.8*  ALBUMIN 2.7*  AST 15  ALT 10  ALKPHOS 41  BILITOT 0.5   PT/INR No results for input(s): LABPROT, INR in the last 72 hours.  Studies/Results: DG Chest Portable 1 View  Result Date: 06/05/2021 CLINICAL DATA:  GI bleeding EXAM: PORTABLE CHEST 1 VIEW COMPARISON:  10/10/2019 FINDINGS: The heart size and mediastinal contours are within normal limits. Emphysema. Both lungs are clear. The visualized skeletal structures are unremarkable. IMPRESSION: No acute disease. COPD. Electronically Signed   By: Martin Mendez M.D.   On: 06/05/2021 05:02    Impression:   Hematemesis, melena. Acute blood loss anemia. (Pre) syncope.  Plan:   PPI, IVF. Blood transfusion in process given degree of bleeding at initial presentation. EGD today. Risks (bleeding, infection, bowel perforation that could require surgery, sedation-related changes in cardiopulmonary systems), benefits (identification and possible treatment of source of symptoms, exclusion of certain causes of symptoms), and alternatives (watchful waiting, radiographic imaging studies, empiric medical treatment) of upper endoscopy (EGD) were explained to patient/family in detail and patient wishes to proceed.     LOS: 0 days   Martin Mendez M  06/05/2021, 1:03 PM  Cell [phone] If no answer or after 5 PM call [phone]

## 2021-06-05 NOTE — ED Notes (Signed)
Contacted blood bank regarding waiting for units of blood-state they called hours ago waiting on another tube of blood. RN sent tube and waiting to hear back.

## 2021-06-05 NOTE — Progress Notes (Signed)
Patient desaturated to 83% on room air. Patient denied feeling any difficulty breathing or shortness of breath. Sats currently 94-97% on 2L oxygen via nasal cannula. Notified Dr. Nevada Crane of the above since patient does not wear oxygen at home.

## 2021-06-05 NOTE — H&P (Signed)
History and Physical    Martin Mendez XHB:[id number] DOB: [birth year] DOA: 06/05/2021  PCP: Lavone Orn, MD  Patient coming from: Home.  Chief Complaint: Throwing up blood.  HPI: Martin Mendez is a 75 y.o. male with history of PTSD and tobacco abuse admitted in 2020 for syncope cause not clear at the time had some epigastric discomfort about 3 days ago when patient took some antacid and 2 days ago had melanotic bowel movements twice.  Last evening patient felt nauseated and uncomfortable and when he went to the bathroom he felt dizzy so when he came back to the room he lay on the floor following which he threw up twice coffee-ground vomitus.  He did not pass out but he almost felt like he was going to pass out.  His wife called EMS and was brought to the ER.  Patient denies taking any NSAIDs or aspirin or drinking alcohol.  ED Course: In the ER patient blood pressure was systolic in the 81O.  Hemoglobin is around 12 about 2 years ago it is around 15.  Stool for occult blood is positive.  COVID test negative.  Since patient was hypotensive at presentation 2 units of PRBC was ordered.  Gastroenterology on-call Dr. Bryan Lemma has been consulted.  Patient has been started on Protonix.  Review of Systems: As per HPI, rest all negative.   Past Medical History:  Diagnosis Date   PTSD (post-traumatic stress disorder)     History reviewed. No pertinent surgical history.   reports that he has been smoking cigarettes. He has been smoking an average of .5 packs per day. He has never used smokeless tobacco. He reports that he does not drink alcohol and does not use drugs.  Allergies  Allergen Reactions   Penicillins Swelling    Did it involve swelling of the face/tongue/throat, SOB, or low BP? No Did it involve sudden or severe rash/hives, skin peeling, or any reaction on the inside of your mouth or nose? No Did you need to seek medical attention at a hospital or doctor's office? No When did it  last happen?    63 years ago (age 60)   If all above answers are "NO", may proceed with cephalosporin use.  Swelling of arms and legs   Sulfa Antibiotics Rash    tongue    History reviewed. No pertinent family history.  Prior to Admission medications   Medication Sig Start Date End Date Taking? Authorizing Provider  acetaminophen (TYLENOL) 500 MG tablet Take 500 mg by mouth daily.    [provider]  citalopram (CELEXA) 20 MG tablet TAKE 1 TABLET BY MOUTH ONCE DAILY IN THE MORNING 09/18/20   Donnal Moat T, PA-C  loratadine (CLARITIN) 10 MG tablet Take 10 mg by mouth daily. Patient not taking: Reported on 07/09/2020    [provider]  propranolol (INDERAL) 10 MG tablet TAKE 1 TABLET BY MOUTH ONCE DAILY AS NEEDED 12/23/19   Addison Lank, PA-C    Physical Exam: Constitutional: Moderately built and nourished. Vitals:   06/05/21 0315 06/05/21 0330 06/05/21 0345 06/05/21 0400  BP: 100/62 111/68 102/61 108/68  Pulse: 80 82 80 80  Resp: (!) 23 (!) 22 (!) 22 (!) 25  Temp:      TempSrc:      SpO2: 90% 100% 100% 97%  Weight:      Height:       Eyes: Anicteric no pallor. ENMT: No discharge from the ears eyes nose and mouth. Neck:  No mass felt.  No neck rigidity. Respiratory: No rhonchi or crepitations. Cardiovascular: S1-S2 heard. Abdomen: Soft nontender bowel sound present. Musculoskeletal: No edema. Skin: No rash. Neurologic: Alert awake oriented to time place and person.  Moves all extremities. Psychiatric: Appears normal.  Normal affect.   Labs on Admission: I have personally reviewed following labs and imaging studies  CBC: Recent Labs  Lab 06/05/21 0321  WBC 10.5  NEUTROABS 7.5  HGB 12.9*  HCT 40.5  MCV 96.9  PLT 952   Basic Metabolic Panel: Recent Labs  Lab 06/05/21 0321  NA 139  K 4.0  CL 103  CO2 31  GLUCOSE 90  BUN 22  CREATININE 0.77  CALCIUM 7.9*   GFR: Estimated Creatinine Clearance: 80.7 mL/min (by C-G formula based on SCr  of 0.77 mg/dL). Liver Function Tests: Recent Labs  Lab 06/05/21 0321  AST 15  ALT 10  ALKPHOS 41  BILITOT 0.5  PROT 4.8*  ALBUMIN 2.7*   No results for input(s): LIPASE, AMYLASE in the last 168 hours. No results for input(s): AMMONIA in the last 168 hours. Coagulation Profile: No results for input(s): INR, PROTIME in the last 168 hours. Cardiac Enzymes: No results for input(s): CKTOTAL, CKMB, CKMBINDEX, TROPONINI in the last 168 hours. BNP (last 3 results) No results for input(s): PROBNP in the last 8760 hours. HbA1C: No results for input(s): HGBA1C in the last 72 hours. CBG: No results for input(s): GLUCAP in the last 168 hours. Lipid Profile: No results for input(s): CHOL, HDL, LDLCALC, TRIG, CHOLHDL, LDLDIRECT in the last 72 hours. Thyroid Function Tests: No results for input(s): TSH, T4TOTAL, FREET4, T3FREE, THYROIDAB in the last 72 hours. Anemia Panel: No results for input(s): VITAMINB12, FOLATE, FERRITIN, TIBC, IRON, RETICCTPCT in the last 72 hours. Urine analysis:    Component Value Date/Time   COLORURINE YELLOW 10/10/2019 0909   APPEARANCEUR HAZY (A) 10/10/2019 0909   LABSPEC 1.012 10/10/2019 0909   PHURINE 7.0 10/10/2019 0909   GLUCOSEU NEGATIVE 10/10/2019 0909   HGBUR MODERATE (A) 10/10/2019 0909   BILIRUBINUR NEGATIVE 10/10/2019 0909   KETONESUR NEGATIVE 10/10/2019 0909   PROTEINUR 30 (A) 10/10/2019 0909   NITRITE NEGATIVE 10/10/2019 0909   LEUKOCYTESUR SMALL (A) 10/10/2019 0909   Sepsis Labs: @LABRCNTIP (procalcitonin:4,lacticidven:4) ) Recent Results (from the past 240 hour(s))  Resp Panel by RT-PCR (Flu A&B, Covid) Nasopharyngeal Swab     Status: None   Collection Time: 06/05/21  3:21 AM   Specimen: Nasopharyngeal Swab; Nasopharyngeal(NP) swabs in vial transport medium  Result Value Ref Range Status   SARS Coronavirus 2 by RT PCR NEGATIVE NEGATIVE Final    Comment: (NOTE) SARS-CoV-2 target nucleic acids are NOT DETECTED.  The SARS-CoV-2 RNA is  generally detectable in upper respiratory specimens during the acute phase of infection. The lowest concentration of SARS-CoV-2 viral copies this assay can detect is 138 copies/mL. A negative result does not preclude SARS-Cov-2 infection and should not be used as the sole basis for treatment or other patient management decisions. A negative result may occur with  improper specimen collection/handling, submission of specimen other than nasopharyngeal swab, presence of viral mutation(s) within the areas targeted by this assay, and inadequate number of viral copies(<138 copies/mL). A negative result must be combined with clinical observations, patient history, and epidemiological information. The expected result is Negative.  Fact Sheet for Patients:  EntrepreneurPulse.com.au  Fact Sheet for Healthcare Providers:  IncredibleEmployment.be  This test is no t yet approved or cleared by the [place] and  has been authorized for detection and/or diagnosis of SARS-CoV-2 by FDA under an Emergency Use Authorization (EUA). This EUA will remain  in effect (meaning this test can be used) for the duration of the COVID-19 declaration under Section 564(b)(1) of the Act, 21 U.S.C.section 360bbb-3(b)(1), unless the authorization is terminated  or revoked sooner.       Influenza A by PCR NEGATIVE NEGATIVE Final   Influenza B by PCR NEGATIVE NEGATIVE Final    Comment: (NOTE) The Xpert Xpress SARS-CoV-2/FLU/RSV plus assay is intended as an aid in the diagnosis of influenza from Nasopharyngeal swab specimens and should not be used as a sole basis for treatment. Nasal washings and aspirates are unacceptable for Xpert Xpress SARS-CoV-2/FLU/RSV testing.  Fact Sheet for Patients: EntrepreneurPulse.com.au  Fact Sheet for Healthcare Providers: IncredibleEmployment.be  This test is not yet approved or cleared by the [place] FDA and has been authorized for detection and/or diagnosis of SARS-CoV-2 by FDA under an Emergency Use Authorization (EUA). This EUA will remain in effect (meaning this test can be used) for the duration of the COVID-19 declaration under Section 564(b)(1) of the Act, 21 U.S.C. section 360bbb-3(b)(1), unless the authorization is terminated or revoked.  Performed at [facility], [place] [address], [place],  [address]      Radiological Exams on Admission: DG Chest Portable 1 View  Result Date: 06/05/2021 CLINICAL DATA:  GI bleeding EXAM: PORTABLE CHEST 1 VIEW COMPARISON:  10/10/2019 FINDINGS: The heart size and mediastinal contours are within normal limits. Emphysema. Both lungs are clear. The visualized skeletal structures are unremarkable. IMPRESSION: No acute disease. COPD. Electronically Signed   By: Monte Fantasia M.D.   On: 06/05/2021 05:02      Assessment/Plan Principal Problem:   Acute GI bleeding Active Problems:   Acute blood loss anemia    Acute GI bleeding likely could be upper GI bleed given the melanotic stools and hematemesis.  Patient is placed on Protonix infusion we will keep patient n.p.o. in anticipation of EGD [facility] has been consulted. Acute blood loss anemia hemoglobin around 12 last 1 was around 15 about 2 years ago.  Since patient was hypotensive at presentation 2 units of PRBC has been ordered.  Follow CBC after transfusion. History of PTSD on citalopram which can be restarted after patient starts taking orally. History of tobacco abuse.   DVT prophylaxis: SCDs. Code Status: Full code. Family Communication: We will need to discuss with family. Disposition Plan: Home. Consults called: [facility]. Admission status: Observation.   Rise Patience MD [facility] Pager [phone].  If 7PM-7AM, please contact night-coverage [email or web address] Password [facility]  06/05/2021, 5:37 AM

## 2021-06-05 NOTE — Consult Note (Signed)
[facility] Consultation Note  Referring Provider: [facility] Primary Care Physician:  Lavone Orn, MD  Reason for Consultation:    HPI: Martin Mendez is a 75 y.o. male presenting one day history of hematemesis, (pre)syncope, melena.  No abdominal pain.  No prior GI bleeding.  Denies NSAIDs or anticoagulants.   Past Medical History:  Diagnosis Date   PTSD (post-traumatic stress disorder)     History reviewed. No pertinent surgical history.  Prior to Admission medications   Medication Sig Start Date End Date Taking? Authorizing Provider  acetaminophen (TYLENOL) 500 MG tablet Take 500 mg by mouth daily.   Yes [provider]  citalopram (CELEXA) 20 MG tablet TAKE 1 TABLET BY MOUTH ONCE DAILY IN THE MORNING Patient taking differently: Take 20 mg by mouth daily. 09/18/20  Yes Donnal Moat T, PA-C  loratadine (CLARITIN) 10 MG tablet Take 10 mg by mouth daily.   Yes [provider]  propranolol (INDERAL) 10 MG tablet TAKE 1 TABLET BY MOUTH ONCE DAILY AS NEEDED Patient taking differently: Take 10 mg by mouth daily as needed (anxiety). 12/23/19  Yes Donnal Moat T, PA-C    Current Facility-Administered Medications  Medication Dose Route Frequency Provider Last Rate Last Admin   [START ON 06/08/2021] pantoprazole (PROTONIX) injection 40 mg  40 mg Intravenous Q12H Rise Patience, MD       pantoprozole (PROTONIX) 80 mg /NS 100 mL infusion  8 mg/hr Intravenous Continuous Rise Patience, MD 10 mL/hr at 06/05/21 0559 8 mg/hr at 06/05/21 0559   Current Outpatient Medications  Medication Sig Dispense Refill   acetaminophen (TYLENOL) 500 MG tablet Take 500 mg by mouth daily.     citalopram (CELEXA) 20 MG tablet TAKE 1 TABLET BY MOUTH ONCE DAILY IN THE MORNING (Patient taking differently: Take 20 mg by mouth daily.) 90 tablet 2   loratadine (CLARITIN) 10 MG tablet Take 10 mg by mouth daily.     propranolol (INDERAL) 10 MG tablet TAKE 1 TABLET BY  MOUTH ONCE DAILY AS NEEDED (Patient taking differently: Take 10 mg by mouth daily as needed (anxiety).) 30 tablet 0    Allergies as of 06/05/2021 - Review Complete 06/05/2021  Allergen Reaction Noted   Penicillins Swelling 11/23/2017   Sulfa antibiotics Rash 11/23/2017    History reviewed. No pertinent family history.  Social History   Socioeconomic History   Marital status: Single    Spouse name: Not on file   Number of children: Not on file   Years of education: Not on file   Highest education level: Not on file  Occupational History   Not on file  Tobacco Use   Smoking status: Every Day    Packs/day: 0.50    Types: Cigarettes   Smokeless tobacco: Never  Substance and Sexual Activity   Alcohol use: No   Drug use: No   Sexual activity: Not on file  Other Topics Concern   Not on file  Social History Narrative   Not on file   Social Determinants of Health   Financial Resource Strain: Not on file  Food Insecurity: Not on file  Transportation Needs: Not on file  Physical Activity: Not on file  Stress: Not on file  Social Connections: Not on file  Intimate Partner Violence: Not on file    Review of Systems: As per HPI, all others negative  Physical Exam: Vital signs in last 24 hours: Temp:  [97.6 F (36.4 C)-97.8 F (36.6 C)] 97.7 F (36.5 C) (  07/23 1058) Pulse Rate:  [78-95] 87 (07/23 1300) Resp:  [14-25] 16 (07/23 1300) BP: (86-113)/(54-72) 102/67 (07/23 1300) SpO2:  [90 %-100 %] 100 % (07/23 1300) Weight:  [72.6 kg] 72.6 kg (07/23 0251)   General:   Alert,  Well-developed, well-nourished, pleasant and cooperative in NAD Head:  Normocephalic and atraumatic. Eyes:  Sclera clear, no icterus.   Conjunctiva pink. Ears:  Normal auditory acuity. Nose:  No deformity, discharge,  or lesions. Mouth:  No deformity or lesions.  Oropharynx pink & moist. Neck:  Supple; no masses or thyromegaly. Heart:  Regular rate and rhythm; no murmurs, clicks, rubs,  or  gallops. Abdomen:  Soft, nontender and nondistended. No masses, hepatosplenomegaly or hernias noted. Normal bowel sounds, without guarding, and without rebound.     Msk:  Symmetrical without gross deformities. Normal posture. Pulses:  Normal pulses noted. Extremities:  Without clubbing or edema. Neurologic:  Alert and  oriented x4;  grossly normal neurologically. Skin:  Intact without significant lesions or rashes. Psych:  Alert and cooperative. Normal mood and affect.   Lab Results: Recent Labs    06/05/21 0321  WBC 10.5  HGB 12.9*  HCT 40.5  PLT 240   BMET Recent Labs    06/05/21 0321  NA 139  K 4.0  CL 103  CO2 31  GLUCOSE 90  BUN 22  CREATININE 0.77  CALCIUM 7.9*   LFT Recent Labs    06/05/21 0321  PROT 4.8*  ALBUMIN 2.7*  AST 15  ALT 10  ALKPHOS 41  BILITOT 0.5   PT/INR No results for input(s): LABPROT, INR in the last 72 hours.  Studies/Results: DG Chest Portable 1 View  Result Date: 06/05/2021 CLINICAL DATA:  GI bleeding EXAM: PORTABLE CHEST 1 VIEW COMPARISON:  10/10/2019 FINDINGS: The heart size and mediastinal contours are within normal limits. Emphysema. Both lungs are clear. The visualized skeletal structures are unremarkable. IMPRESSION: No acute disease. COPD. Electronically Signed   By: Monte Fantasia M.D.   On: 06/05/2021 05:02    Impression:   Hematemesis, melena. Acute blood loss anemia. (Pre) syncope.  Plan:   PPI, IVF. Blood transfusion in process given degree of bleeding at initial presentation. EGD today. Risks (bleeding, infection, bowel perforation that could require surgery, sedation-related changes in cardiopulmonary systems), benefits (identification and possible treatment of source of symptoms, exclusion of certain causes of symptoms), and alternatives (watchful waiting, radiographic imaging studies, empiric medical treatment) of upper endoscopy (EGD) were explained to patient/family in detail and patient wishes to proceed.     LOS: 0 days   Amon Costilla M  06/05/2021, 1:03 PM  Cell [phone] If no answer or after 5 PM call [phone]

## 2021-06-05 NOTE — Anesthesia Postprocedure Evaluation (Signed)
Anesthesia Post Note  Patient: Martin Mendez  Procedure(s) Performed: ESOPHAGOGASTRODUODENOSCOPY (EGD) WITH PROPOFOL     Patient location during evaluation: PACU Anesthesia Type: MAC Level of consciousness: awake and alert Pain management: pain level controlled Vital Signs Assessment: post-procedure vital signs reviewed and stable Respiratory status: spontaneous breathing, nonlabored ventilation, respiratory function stable and patient connected to nasal cannula oxygen Cardiovascular status: stable and blood pressure returned to baseline Postop Assessment: no apparent nausea or vomiting Anesthetic complications: no   No notable events documented.  Last Vitals:  Vitals:   06/05/21 1422 06/05/21 1430  BP: (!) 113/55 (!) 106/52  Pulse: 93 90  Resp: (!) 22 (!) 23  Temp:    SpO2: 100% 99%    Last Pain:  Vitals:   06/05/21 1430  TempSrc:   PainSc: 0-No pain                 Tiajuana Amass

## 2021-06-05 NOTE — ED Triage Notes (Addendum)
Pt arrived via [facility] from home for cc of abdominal pain with black tarry stool and coffee ground emesis with multiple syncopal episodes tonight.  Pt reported abdominal pain x3 days, black tarry stools and coffee ground vomiting began tonight. Syncopal episode tonight with fall, wife called EMS, small abrasion noted on right forearm. 2nd and 3rd syncopal episode occurred with EMS and fire.   EMS obtained 18g LFA, administered 1000L NS.  EMS Vitals  HR 77 BP 102/60 SPO2 94% RA BS 122

## 2021-06-05 NOTE — ED Notes (Signed)
Patient ambulated to restroom with steady gait and one person assist. Denies any light headedness. Mixture of bright red blood and darker color stool noted in toilet.

## 2021-06-05 NOTE — ED Provider Notes (Signed)
[facility] EMERGENCY DEPARTMENT Provider Note   CSN: [id number] Arrival date & time: 06/05/21  4765     History Chief Complaint  Patient presents with   Emesis   Hypotension    Martin Mendez is a 75 y.o. male.  The history is provided by the patient.  Emesis Severity:  Mild Duration: 2 hours one episode of hematemesis. Timing:  Rare Quality:  Coffee grounds Progression:  Resolved Chronicity:  New Recent urination:  Normal Context: not post-tussive   Relieved by:  Nothing Worsened by:  Nothing Ineffective treatments:  None tried Associated symptoms: no fever   Associated symptoms comment:  Melena for 2 days and syncope this evening  Risk factors: no prior abdominal surgery and no suspect food intake   Patient with COPD presents with several days of melena and and one episode of hematemesis at approximately 115 am today.  Patient also was syncope today as well. No Aspirin, no anticoagulation.      Past Medical History:  Diagnosis Date   PTSD (post-traumatic stress disorder)     Patient Active Problem List   Diagnosis Date Noted   Syncope 10/10/2019   Epigastric pain 10/10/2019   COPD with acute exacerbation ([place]) 12/13/2018   Acute respiratory failure with hypoxia ([place]) 12/13/2018   Hyponatremia 12/11/2018   Influenza A 12/10/2018   Acute wheezy bronchitis 12/10/2018   Hypoxia 12/10/2018   Smoker 12/10/2018   PTSD (post-traumatic stress disorder) 12/10/2018    History reviewed. No pertinent surgical history.     History reviewed. No pertinent family history.  Social History   Tobacco Use   Smoking status: Every Day    Packs/day: 0.50    Types: Cigarettes   Smokeless tobacco: Never  Substance Use Topics   Alcohol use: No   Drug use: No    Home Medications Prior to Admission medications   Medication Sig Start Date End Date Taking? Authorizing Provider  acetaminophen (TYLENOL) 500 MG tablet Take 500 mg by mouth daily.     [provider]  citalopram (CELEXA) 20 MG tablet TAKE 1 TABLET BY MOUTH ONCE DAILY IN THE MORNING 09/18/20   Donnal Moat T, PA-C  loratadine (CLARITIN) 10 MG tablet Take 10 mg by mouth daily. Patient not taking: Reported on 07/09/2020    [provider]  propranolol (INDERAL) 10 MG tablet TAKE 1 TABLET BY MOUTH ONCE DAILY AS NEEDED 12/23/19   Donnal Moat T, PA-C    Allergies    Penicillins and Sulfa antibiotics  Review of Systems   Review of Systems  Constitutional:  Negative for fever.  HENT:  Negative for facial swelling.   Eyes:  Negative for redness.  Respiratory:  Negative for wheezing and stridor.   Cardiovascular:  Negative for leg swelling.  Gastrointestinal:  Positive for anal bleeding, nausea and vomiting.  Genitourinary:  Negative for difficulty urinating.  Musculoskeletal:  Negative for joint swelling.  Neurological:  Positive for syncope.  Psychiatric/Behavioral:  Negative for agitation.   All other systems reviewed and are negative.  Physical Exam Updated Vital Signs BP 108/68   Pulse 80   Temp 97.6 F (36.4 C) (Oral)   Resp (!) 25   Ht 5\' 10"  (1.778 m)   Wt 72.6 kg   SpO2 97%   BMI 22.96 kg/m   Physical Exam Vitals and nursing note reviewed. Exam conducted with a chaperone present.  Constitutional:      Appearance: Normal appearance. He is not diaphoretic.  HENT:  Head: Normocephalic and atraumatic.     Nose: Nose normal.  Eyes:     Conjunctiva/sclera: Conjunctivae normal.     Pupils: Pupils are equal, round, and reactive to light.  Cardiovascular:     Rate and Rhythm: Normal rate and regular rhythm.     Pulses: Normal pulses.     Heart sounds: Normal heart sounds.  Pulmonary:     Effort: Pulmonary effort is normal.     Breath sounds: Normal breath sounds.  Abdominal:     General: Abdomen is flat. Bowel sounds are normal.     Palpations: Abdomen is soft.     Tenderness: There is no abdominal tenderness. There is no guarding  or rebound.  Genitourinary:    Rectum: Guaiac result positive.     Comments: Grossly positive  Musculoskeletal:        General: Normal range of motion.     Cervical back: Normal range of motion and neck supple.  Skin:    General: Skin is warm and dry.     Capillary Refill: Capillary refill takes less than 2 seconds.  Neurological:     General: No focal deficit present.     Mental Status: He is alert and oriented to person, place, and time.     Deep Tendon Reflexes: Reflexes normal.  Psychiatric:        Mood and Affect: Mood normal.        Behavior: Behavior normal.    ED Results / Procedures / Treatments   Labs (all labs ordered are listed, but only abnormal results are displayed) Results for orders placed or performed during the hospital encounter of 06/05/21  CBC with Differential/Platelet  Result Value Ref Range   WBC 10.5 4.0 - 10.5 K/uL   RBC 4.18 (L) 4.22 - 5.81 MIL/uL   Hemoglobin 12.9 (L) 13.0 - 17.0 g/dL   HCT 40.5 39.0 - 52.0 %   MCV 96.9 80.0 - 100.0 fL   MCH 30.9 26.0 - 34.0 pg   MCHC 31.9 30.0 - 36.0 g/dL   RDW 13.0 11.5 - 15.5 %   Platelets 240 150 - 400 K/uL   nRBC 0.0 0.0 - 0.2 %   Neutrophils Relative % 70 %   Neutro Abs 7.5 1.7 - 7.7 K/uL   Lymphocytes Relative 19 %   Lymphs Abs 1.9 0.7 - 4.0 K/uL   Monocytes Relative 6 %   Monocytes Absolute 0.6 0.1 - 1.0 K/uL   Eosinophils Relative 3 %   Eosinophils Absolute 0.3 0.0 - 0.5 K/uL   Basophils Relative 1 %   Basophils Absolute 0.1 0.0 - 0.1 K/uL   Immature Granulocytes 1 %   Abs Immature Granulocytes 0.05 0.00 - 0.07 K/uL  Comprehensive metabolic panel  Result Value Ref Range   Sodium 139 135 - 145 mmol/L   Potassium 4.0 3.5 - 5.1 mmol/L   Chloride 103 98 - 111 mmol/L   CO2 31 22 - 32 mmol/L   Glucose, Bld 90 70 - 99 mg/dL   BUN 22 8 - 23 mg/dL   Creatinine, Ser 0.77 0.61 - 1.24 mg/dL   Calcium 7.9 (L) 8.9 - 10.3 mg/dL   Total Protein 4.8 (L) 6.5 - 8.1 g/dL   Albumin 2.7 (L) 3.5 - 5.0 g/dL    AST 15 15 - 41 U/L   ALT 10 0 - 44 U/L   Alkaline Phosphatase 41 38 - 126 U/L   Total Bilirubin 0.5 0.3 - 1.2 mg/dL   GFR,  Estimated >60 >60 mL/min   Anion gap 5 5 - 15  POC occult blood, ED Provider will collect  Result Value Ref Range   Fecal Occult Bld POSITIVE (A) NEGATIVE  I-Stat beta hCG blood, ED ([facility], [facility], [facility] only)  Result Value Ref Range   I-stat hCG, quantitative <5.0 <5 mIU/mL   Comment 3          Type and screen  Result Value Ref Range   ABO/RH(D) O POS    Antibody Screen NEG    Sample Expiration      06/08/2021,2359 Performed at [facility] [address] [place], [place] [address]    No results found.   EKG None  Radiology No results found.  Procedures Procedures   Medications Ordered in ED Medications  pantoprazole (PROTONIX) 80 mg /NS 100 mL IVPB (has no administration in time range)  pantoprozole (PROTONIX) 80 mg /NS 100 mL infusion (has no administration in time range)  pantoprazole (PROTONIX) injection 40 mg (has no administration in time range)  sodium chloride 0.9 % bolus 500 mL (has no administration in time range)  ondansetron (ZOFRAN) injection 4 mg (has no administration in time range)    ED Course  I have reviewed the triage vital signs and the nursing notes.  Pertinent labs & imaging results that were available during my care of the patient were reviewed by me and considered in my medical decision making (see chart for details).   Secure chat sent to Dr. Bryan Lemma of GI.    Patient is NPO on a protonix drip, IVF given and patient has been type and screened.     Final Clinical Impression(s) / ED Diagnoses Final diagnoses:  Gastrointestinal hemorrhage, unspecified gastrointestinal hemorrhage type   Admit to medicine  Rx / DC Orders ED Discharge Orders     None        Quintasia Theroux, MD 06/05/21 [phone]

## 2021-06-05 NOTE — Progress Notes (Signed)
HPI: Martin Mendez is a 75 y.o. male with history of PTSD and tobacco abuse admitted in 2020 for syncope cause not clear at the time had some epigastric discomfort about 3 days ago when patient took some antacid and 2 days ago had melanotic bowel movements twice.  Last evening patient felt nauseated and uncomfortable and when he went to the bathroom he felt dizzy so when he came back to the room he lay on the floor following which he threw up twice coffee-ground vomitus.  He did not pass out but he almost felt like he was going to pass out.  His wife called EMS and was brought to the ER.  Patient denies taking any NSAIDs or aspirin or drinking alcohol.   ED Course: In the ER patient blood pressure was systolic in the 60O.  Hemoglobin is around 12 about 2 years ago it is around 15.  Stool for occult blood is positive.  COVID test negative.  Since patient was hypotensive at presentation 2 units of PRBC was ordered.  Gastroenterology on-call Dr. Bryan Lemma has been consulted.  Patient has been started on Protonix.  06/05/2021: Patient was seen in his room in the ED this morning prior to EGD.  He was somnolent, denied any abdominal pain, chest pain or dyspnea.  He admitted to generalized weakness.  Patient had EGD done by Dr. Paulita Fujita with the following findings:  Normal esophagus. - Gastritis. - Duodenitis. - Non-bleeding duodenal ulcer with no stigmata of bleeding. - No old or fresh blood seen to the extent of our examination. Impression: - Return patient to hospital ward for ongoing care. - Clear liquid diet today. - Continue present medications. - No NSAIDs. - Perform an H. pylori serology tomorrow. [name] will follow. When/if further rampant bleeding, would likely consider IRmediated embolization of gastroduodenal artery.   Please refer to H&P dictated by my partner Dr. Hal Hope on 06/05/2021 for further details of the assessment and plan.

## 2021-06-06 DIAGNOSIS — Z23 Encounter for immunization: Secondary | ICD-10-CM | POA: Diagnosis not present

## 2021-06-06 DIAGNOSIS — E785 Hyperlipidemia, unspecified: Secondary | ICD-10-CM | POA: Diagnosis present

## 2021-06-06 DIAGNOSIS — G459 Transient cerebral ischemic attack, unspecified: Secondary | ICD-10-CM | POA: Diagnosis not present

## 2021-06-06 DIAGNOSIS — F4310 Post-traumatic stress disorder, unspecified: Secondary | ICD-10-CM | POA: Diagnosis present

## 2021-06-06 DIAGNOSIS — K2980 Duodenitis without bleeding: Secondary | ICD-10-CM | POA: Diagnosis present

## 2021-06-06 DIAGNOSIS — Z880 Allergy status to penicillin: Secondary | ICD-10-CM | POA: Diagnosis not present

## 2021-06-06 DIAGNOSIS — R55 Syncope and collapse: Secondary | ICD-10-CM | POA: Diagnosis present

## 2021-06-06 DIAGNOSIS — Z20822 Contact with and (suspected) exposure to covid-19: Secondary | ICD-10-CM | POA: Diagnosis present

## 2021-06-06 DIAGNOSIS — R4701 Aphasia: Secondary | ICD-10-CM | POA: Diagnosis not present

## 2021-06-06 DIAGNOSIS — E872 Acidosis: Secondary | ICD-10-CM | POA: Diagnosis present

## 2021-06-06 DIAGNOSIS — J9622 Acute and chronic respiratory failure with hypercapnia: Secondary | ICD-10-CM | POA: Diagnosis present

## 2021-06-06 DIAGNOSIS — I639 Cerebral infarction, unspecified: Secondary | ICD-10-CM | POA: Diagnosis not present

## 2021-06-06 DIAGNOSIS — A048 Other specified bacterial intestinal infections: Secondary | ICD-10-CM | POA: Diagnosis present

## 2021-06-06 DIAGNOSIS — F17210 Nicotine dependence, cigarettes, uncomplicated: Secondary | ICD-10-CM | POA: Diagnosis present

## 2021-06-06 DIAGNOSIS — K264 Chronic or unspecified duodenal ulcer with hemorrhage: Secondary | ICD-10-CM | POA: Diagnosis present

## 2021-06-06 DIAGNOSIS — I6389 Other cerebral infarction: Secondary | ICD-10-CM | POA: Diagnosis not present

## 2021-06-06 DIAGNOSIS — Z823 Family history of stroke: Secondary | ICD-10-CM | POA: Diagnosis not present

## 2021-06-06 DIAGNOSIS — K2970 Gastritis, unspecified, without bleeding: Secondary | ICD-10-CM | POA: Diagnosis present

## 2021-06-06 DIAGNOSIS — I63413 Cerebral infarction due to embolism of bilateral middle cerebral arteries: Secondary | ICD-10-CM | POA: Diagnosis not present

## 2021-06-06 DIAGNOSIS — J439 Emphysema, unspecified: Secondary | ICD-10-CM | POA: Diagnosis present

## 2021-06-06 DIAGNOSIS — I959 Hypotension, unspecified: Secondary | ICD-10-CM | POA: Diagnosis present

## 2021-06-06 DIAGNOSIS — D62 Acute posthemorrhagic anemia: Secondary | ICD-10-CM | POA: Diagnosis present

## 2021-06-06 DIAGNOSIS — K922 Gastrointestinal hemorrhage, unspecified: Secondary | ICD-10-CM | POA: Diagnosis not present

## 2021-06-06 DIAGNOSIS — G4733 Obstructive sleep apnea (adult) (pediatric): Secondary | ICD-10-CM | POA: Diagnosis present

## 2021-06-06 DIAGNOSIS — Z79899 Other long term (current) drug therapy: Secondary | ICD-10-CM | POA: Diagnosis not present

## 2021-06-06 DIAGNOSIS — Z882 Allergy status to sulfonamides status: Secondary | ICD-10-CM | POA: Diagnosis not present

## 2021-06-06 LAB — CBC
HCT: 39.2 % (ref 39.0–52.0)
Hemoglobin: 12.1 g/dL — ABNORMAL LOW (ref 13.0–17.0)
MCH: 30.3 pg (ref 26.0–34.0)
MCHC: 30.9 g/dL (ref 30.0–36.0)
MCV: 98 fL (ref 80.0–100.0)
Platelets: 193 10*3/uL (ref 150–400)
RBC: 4 MIL/uL — ABNORMAL LOW (ref 4.22–5.81)
RDW: 13.9 % (ref 11.5–15.5)
WBC: 8.4 10*3/uL (ref 4.0–10.5)
nRBC: 0 % (ref 0.0–0.2)

## 2021-06-06 LAB — BLOOD GAS, ARTERIAL
Acid-Base Excess: 7.4 mmol/L — ABNORMAL HIGH (ref 0.0–2.0)
Bicarbonate: 34.3 mmol/L — ABNORMAL HIGH (ref 20.0–28.0)
Drawn by: 22766
FIO2: 28
O2 Saturation: 97.1 %
Patient temperature: 37
pCO2 arterial: 80.1 mmHg (ref 32.0–48.0)
pH, Arterial: 7.255 — ABNORMAL LOW (ref 7.350–7.450)
pO2, Arterial: 89.8 mmHg (ref 83.0–108.0)

## 2021-06-06 MED ORDER — ONDANSETRON HCL 4 MG/2ML IJ SOLN
4.0000 mg | Freq: Four times a day (QID) | INTRAMUSCULAR | Status: DC | PRN
  Administered 2021-06-08: 4 mg via INTRAVENOUS
  Filled 2021-06-06: qty 2

## 2021-06-06 MED ORDER — ACETAMINOPHEN 325 MG PO TABS: 650.0000 mg | ORAL_TABLET | Freq: Four times a day (QID) | ORAL | Status: DC | PRN

## 2021-06-06 MED ORDER — LORATADINE 10 MG PO TABS
10.0000 mg | ORAL_TABLET | Freq: Every day | ORAL | Status: DC
  Administered 2021-06-11 – 2021-06-12 (×2): 10 mg via ORAL
  Filled 2021-06-06 (×6): qty 1

## 2021-06-06 MED ORDER — MELATONIN 3 MG PO TABS
3.0000 mg | ORAL_TABLET | Freq: Every evening | ORAL | Status: DC | PRN
  Filled 2021-06-06: qty 1

## 2021-06-06 MED ORDER — IPRATROPIUM-ALBUTEROL 0.5-2.5 (3) MG/3ML IN SOLN: 3.0000 mL | RESPIRATORY_TRACT | Status: DC | PRN

## 2021-06-06 MED ORDER — PANTOPRAZOLE SODIUM 40 MG PO TBEC
40.0000 mg | DELAYED_RELEASE_TABLET | Freq: Two times a day (BID) | ORAL | Status: DC
  Administered 2021-06-06 – 2021-06-08 (×4): 40 mg via ORAL
  Filled 2021-06-06 (×4): qty 1

## 2021-06-06 MED ORDER — POLYETHYLENE GLYCOL 3350 17 G PO PACK
17.0000 g | PACK | Freq: Every day | ORAL | Status: DC | PRN
  Administered 2021-06-11: 17 g via ORAL
  Filled 2021-06-06: qty 1

## 2021-06-06 MED ORDER — PROPRANOLOL HCL 10 MG PO TABS
10.0000 mg | ORAL_TABLET | Freq: Every day | ORAL | Status: DC | PRN
  Administered 2021-06-08: 10 mg via ORAL
  Filled 2021-06-06 (×3): qty 1

## 2021-06-06 MED ORDER — CITALOPRAM HYDROBROMIDE 20 MG PO TABS
20.0000 mg | ORAL_TABLET | Freq: Every morning | ORAL | Status: DC
  Administered 2021-06-06 – 2021-06-12 (×7): 20 mg via ORAL
  Filled 2021-06-06 (×7): qty 1

## 2021-06-06 MED ORDER — NICOTINE 14 MG/24HR TD PT24
14.0000 mg | MEDICATED_PATCH | Freq: Every day | TRANSDERMAL | Status: DC
  Filled 2021-06-06 (×5): qty 1

## 2021-06-06 NOTE — Progress Notes (Addendum)
Date and time results received: 06/06/21 0924  Test: blood gas-- pCO2 Critical Value: 80.1  Name of Provider Notified: Dr. Nevada Crane  Orders Received? Or Actions Taken?:  Order for bipap place in epic.  Respiratory notified.

## 2021-06-06 NOTE — Progress Notes (Signed)
Late entry...  Notified by CCMD pt had 15 run of vtach at [facility] [facility]

## 2021-06-06 NOTE — Progress Notes (Addendum)
PROGRESS NOTE  Martin Mendez OIN:[id number] DOB: 02/24/1946 DOA: 06/05/2021 PCP: Lavone Orn, MD  HPI/Recap of past 24 hours: Martin Mendez is a 75 y.o. male with history of PTSD on Celexa and tobacco abuse, hx of syncopal event 10/11/2019 with unclear cause admitted in 2020 for syncope cause not clear who presented with one day of hematemesis, presyncope and melena.  Denies prior hx of GI bleed, or NSAIDs use.  Upon presentation to the ED he was noted to have a drop in his Hg 12 from 15 in 2020 and a positive fecal occult test.  He was transfused 1 unit PRBC.  Was seen by GI.  Post EGD on 06/05/2021.  It reveals the findings as noted below. - Gastritis. - Duodenitis. - Non-bleeding duodenal ulcer with no stigmata of bleeding.  Hospital course complicated by acute hypoxic and hypercarbic respiratory failure.  Arterial blood gas 06/06/2021: pH 7.255/80.1/89.8.  Personally reviewed chest x-ray done on 06/05/2021 which revealed hyperinflated lungs.  At the time of this visit the patient is alert and oriented x4.  Not in acute distress.  BiPAP has been ordered.  The patient is agreeable to use.  Will likely require NIV and outpatient follow-up with pulmonary at discharge.  Patient is receptive to these recommendations.  06/06/21: He was seen and examined at his bedside.  He denies have any abdominal pain or cardiopulmonary symptoms.   Assessment/Plan: Principal Problem:   Acute GI bleeding Active Problems:   Acute blood loss anemia  Acute GI bleed secondary to gastritis, duodenitis, nonbleeding duodenal ulcer with no stigmata of bleeding, seen on EGD done on 06/05/2021 by Dr. Paulita Fujita. Patient presented with hematemesis, presyncope and melena. Positive FOBT and drop in hemoglobin. Denies use of NSAIDs H. pylori test, results are pending, follow. Continue to monitor H&H.  Acute hypoxic hypercarbic respiratory failure secondary to likely undiagnosed advanced COPD. Mentation intact, no evidence of  acute respiratory distress.  Likely chronic CO2 retainer. Will need pulmonary referral prior to DC. Arterial blood gas from 06/06/2021 on 2 L nasal cannula: 7.255/80.1/89.8. Personally reviewed chest x-ray done on admission which shows hyperinflated lungs with concern for undiagnosed COPD. Start BiPAP nightly Consider NIV prior to discharge for likely chronic CO2 retention Add DuoNebs as needed for wheezing and shortness of breath.  PTSD on Celexa Stable at this time Continue home Celexa Self-reported uses propranolol as needed for panic attacks.  Tobacco use disorder Started smoking at the age of 40, mostly 2 packs/year  For the past 2 years has cut down use of cigarettes to 6 to 8 cigarettes/day.   Add nicotine patch.   Code Status: Full code  Family Communication: None at bedside  Disposition Plan: Likely will be discharged to home once GI signs off.   Consultants: GI  Procedures: EGD done on 06/05/2021  Antimicrobials: None  DVT prophylaxis: SCDs  Status is: Inpatient    Dispo: The patient is from: Home               Anticipated d/c is to: Home               Patient currently not stable for discharge    Difficult to place patient, not applicable.        Objective: Vitals:   06/05/21 1525 06/05/21 2104 06/06/21 0444 06/06/21 1000  BP: 109/65 (!) 96/55 (!) 105/58 (!) 105/58  Pulse: 84 87 81 80  Resp: 20 19 18 20   Temp: (!) 97.5 F (36.4 C) 98.3  F (36.8 C) 98.1 F (36.7 C) 98.9 F (37.2 C)  TempSrc: Axillary Oral Oral Oral  SpO2: 95% 100% 95% 100%  Weight:      Height:        Intake/Output Summary (Last 24 hours) at 06/06/2021 1156 Last data filed at 06/06/2021 1100 Gross per 24 hour  Intake 1252.17 ml  Output --  Net 1252.17 ml   Filed Weights   06/05/21 0251 06/05/21 1316  Weight: 72.6 kg 72.6 kg    Exam:  General: 75 y.o. year-old male well developed well nourished in no acute distress.  Alert and oriented x3. Cardiovascular:  Regular rate and rhythm with no rubs or gallops.  No thyromegaly or JVD noted.   Respiratory: Clear to auscultation with no wheezes or rales. Good inspiratory effort. Abdomen: Soft nontender nondistended with normal bowel sounds x4 quadrants. Musculoskeletal: No lower extremity edema. 2/4 pulses in all 4 extremities. Skin: No ulcerative lesions noted or rashes, Psychiatry: Mood is appropriate for condition and setting   Data Reviewed: CBC: Recent Labs  Lab 06/05/21 0321 06/05/21 1529 06/06/21 0810  WBC 10.5  --  8.4  NEUTROABS 7.5  --   --   HGB 12.9* 13.8 12.1*  HCT 40.5 43.3 39.2  MCV 96.9  --  98.0  PLT 240  --  749   Basic Metabolic Panel: Recent Labs  Lab 06/05/21 0321  NA 139  K 4.0  CL 103  CO2 31  GLUCOSE 90  BUN 22  CREATININE 0.77  CALCIUM 7.9*   GFR: Estimated Creatinine Clearance: 80.7 mL/min (by C-G formula based on SCr of 0.77 mg/dL). Liver Function Tests: Recent Labs  Lab 06/05/21 0321  AST 15  ALT 10  ALKPHOS 41  BILITOT 0.5  PROT 4.8*  ALBUMIN 2.7*   No results for input(s): LIPASE, AMYLASE in the last 168 hours. No results for input(s): AMMONIA in the last 168 hours. Coagulation Profile: No results for input(s): INR, PROTIME in the last 168 hours. Cardiac Enzymes: No results for input(s): CKTOTAL, CKMB, CKMBINDEX, TROPONINI in the last 168 hours. BNP (last 3 results) No results for input(s): PROBNP in the last 8760 hours. HbA1C: No results for input(s): HGBA1C in the last 72 hours. CBG: No results for input(s): GLUCAP in the last 168 hours. Lipid Profile: No results for input(s): CHOL, HDL, LDLCALC, TRIG, CHOLHDL, LDLDIRECT in the last 72 hours. Thyroid Function Tests: No results for input(s): TSH, T4TOTAL, FREET4, T3FREE, THYROIDAB in the last 72 hours. Anemia Panel: No results for input(s): VITAMINB12, FOLATE, FERRITIN, TIBC, IRON, RETICCTPCT in the last 72 hours. Urine analysis:    Component Value Date/Time   COLORURINE YELLOW  10/10/2019 0909   APPEARANCEUR HAZY (A) 10/10/2019 0909   LABSPEC 1.012 10/10/2019 0909   PHURINE 7.0 10/10/2019 0909   GLUCOSEU NEGATIVE 10/10/2019 0909   HGBUR MODERATE (A) 10/10/2019 0909   BILIRUBINUR NEGATIVE 10/10/2019 0909   KETONESUR NEGATIVE 10/10/2019 0909   PROTEINUR 30 (A) 10/10/2019 0909   NITRITE NEGATIVE 10/10/2019 0909   LEUKOCYTESUR SMALL (A) 10/10/2019 0909   Sepsis Labs: @LABRCNTIP (procalcitonin:4,lacticidven:4)  ) Recent Results (from the past 240 hour(s))  Resp Panel by RT-PCR (Flu A&B, Covid) Nasopharyngeal Swab     Status: None   Collection Time: 06/05/21  3:21 AM   Specimen: Nasopharyngeal Swab; Nasopharyngeal(NP) swabs in vial transport medium  Result Value Ref Range Status   SARS Coronavirus 2 by RT PCR NEGATIVE NEGATIVE Final    Comment: (NOTE) SARS-CoV-2 target nucleic acids are  NOT DETECTED.  The SARS-CoV-2 RNA is generally detectable in upper respiratory specimens during the acute phase of infection. The lowest concentration of SARS-CoV-2 viral copies this assay can detect is 138 copies/mL. A negative result does not preclude SARS-Cov-2 infection and should not be used as the sole basis for treatment or other patient management decisions. A negative result may occur with  improper specimen collection/handling, submission of specimen other than nasopharyngeal swab, presence of viral mutation(s) within the areas targeted by this assay, and inadequate number of viral copies(<138 copies/mL). A negative result must be combined with clinical observations, patient history, and epidemiological information. The expected result is Negative.  Fact Sheet for Patients:  EntrepreneurPulse.com.au  Fact Sheet for Healthcare Providers:  IncredibleEmployment.be  This test is no t yet approved or cleared by the [place] FDA and  has been authorized for detection and/or diagnosis of SARS-CoV-2 by FDA under an Emergency  Use Authorization (EUA). This EUA will remain  in effect (meaning this test can be used) for the duration of the COVID-19 declaration under Section 564(b)(1) of the Act, 21 U.S.C.section 360bbb-3(b)(1), unless the authorization is terminated  or revoked sooner.       Influenza A by PCR NEGATIVE NEGATIVE Final   Influenza B by PCR NEGATIVE NEGATIVE Final    Comment: (NOTE) The Xpert Xpress SARS-CoV-2/FLU/RSV plus assay is intended as an aid in the diagnosis of influenza from Nasopharyngeal swab specimens and should not be used as a sole basis for treatment. Nasal washings and aspirates are unacceptable for Xpert Xpress SARS-CoV-2/FLU/RSV testing.  Fact Sheet for Patients: EntrepreneurPulse.com.au  Fact Sheet for Healthcare Providers: IncredibleEmployment.be  This test is not yet approved or cleared by the [place] FDA and has been authorized for detection and/or diagnosis of SARS-CoV-2 by FDA under an Emergency Use Authorization (EUA). This EUA will remain in effect (meaning this test can be used) for the duration of the COVID-19 declaration under Section 564(b)(1) of the Act, 21 U.S.C. section 360bbb-3(b)(1), unless the authorization is terminated or revoked.  Performed at [facility], [place] [address], [place], [place] [address]       Studies: No results found.  Scheduled Meds:  citalopram  20 mg Oral q morning   COVID-19 mRNA Vac-TriS (Pfizer)  0.3 mL Intramuscular ONCE-1600   loratadine  10 mg Oral Daily   [START ON 06/08/2021] pantoprazole  40 mg Intravenous Q12H   pneumococcal 23 valent vaccine  0.5 mL Intramuscular Tomorrow-1000    Continuous Infusions:  pantoprazole 8 mg/hr (06/06/21 1100)     LOS: 0 days     Kayleen Memos, MD [facility] Pager [phone]  If 7PM-7AM, please contact night-coverage [email or web address] Password [facility] 06/06/2021, 11:56 AM

## 2021-06-06 NOTE — Plan of Care (Signed)
Protonix gtt discontinued given PO. Pt cleared by GI team for discharge. Will stay per primary team for closer evaluation of oxygen status (Bipap at night since pt is asymptomatically hypercarbic). Covid booster administered (Right deltoid) today without any complications. Diet advanced. No bloody stools.    Problem: Education: Goal: Knowledge of General Education information will improve Description: Including pain rating scale, medication(s)/side effects and non-pharmacologic comfort measures Outcome: Progressing   Problem: Health Behavior/Discharge Planning: Goal: Ability to manage health-related needs will improve Outcome: Progressing   Problem: Clinical Measurements: Goal: Ability to maintain clinical measurements within normal limits will improve Outcome: Progressing Goal: Will remain free from infection Outcome: Progressing Goal: Diagnostic test results will improve Outcome: Progressing Goal: Respiratory complications will improve Outcome: Progressing Goal: Cardiovascular complication will be avoided Outcome: Progressing   Problem: Activity: Goal: Risk for activity intolerance will decrease Outcome: Progressing   Problem: Nutrition: Goal: Adequate nutrition will be maintained Outcome: Progressing   Problem: Coping: Goal: Level of anxiety will decrease Outcome: Progressing   Problem: Elimination: Goal: Will not experience complications related to bowel motility Outcome: Progressing Goal: Will not experience complications related to urinary retention Outcome: Progressing   Problem: Pain Managment: Goal: General experience of comfort will improve Outcome: Progressing   Problem: Safety: Goal: Ability to remain free from injury will improve Outcome: Progressing   Problem: Skin Integrity: Goal: Risk for impaired skin integrity will decrease Outcome: Progressing   Problem: Education: Goal: Ability to identify signs and symptoms of gastrointestinal bleeding will  improve Outcome: Progressing   Problem: Bowel/Gastric: Goal: Will show no signs and symptoms of gastrointestinal bleeding Outcome: Progressing   Problem: Fluid Volume: Goal: Will show no signs and symptoms of excessive bleeding Outcome: Progressing   Problem: Clinical Measurements: Goal: Complications related to the disease process, condition or treatment will be avoided or minimized Outcome: Progressing

## 2021-06-06 NOTE — Progress Notes (Signed)
Subjective: No blood in stool. No abdominal pain.  Objective: Vital signs in last 24 hours: Temp:  [97.5 F (36.4 C)-98.9 F (37.2 C)] 98.2 F (36.8 C) (07/24 1357) Pulse Rate:  [80-90] 85 (07/24 1400) Resp:  [18-23] 18 (07/24 1357) BP: (96-109)/(52-65) 103/60 (07/24 1400) SpO2:  [88 %-100 %] 88 % (07/24 1400) Weight change: 0 kg Last BM Date: 06/05/21  PE GEN:  NAD:  Lab Results: CBC    Component Value Date/Time   WBC 8.4 06/06/2021 0810   RBC 4.00 (L) 06/06/2021 0810   HGB 12.1 (L) 06/06/2021 0810   HCT 39.2 06/06/2021 0810   PLT 193 06/06/2021 0810   MCV 98.0 06/06/2021 0810   MCH 30.3 06/06/2021 0810   MCHC 30.9 06/06/2021 0810   RDW 13.9 06/06/2021 0810   LYMPHSABS 1.9 06/05/2021 0321   MONOABS 0.6 06/05/2021 0321   EOSABS 0.3 06/05/2021 0321   BASOSABS 0.1 06/05/2021 0321  CMP     Component Value Date/Time   NA 139 06/05/2021 0321   K 4.0 06/05/2021 0321   CL 103 06/05/2021 0321   CO2 31 06/05/2021 0321   GLUCOSE 90 06/05/2021 0321   BUN 22 06/05/2021 0321   CREATININE 0.77 06/05/2021 0321   CALCIUM 7.9 (L) 06/05/2021 0321   PROT 4.8 (L) 06/05/2021 0321   ALBUMIN 2.7 (L) 06/05/2021 0321   AST 15 06/05/2021 0321   ALT 10 06/05/2021 0321   ALKPHOS 41 06/05/2021 0321   BILITOT 0.5 06/05/2021 0321   GFRNONAA >60 06/05/2021 0321   GFRAA >60 10/11/2019 0612   Assessment:   Hematemesis and melena, resolved. Acute blood loss anemia, mild. Duodenal ulcer, likely cause of #1 and #2 above.  Plan:   Pantoprazole 40 mg po bid x 6 weeks, then 40 mg po qd indefinitely thereafter. No NSAIDs indefinitely. Advance diet. Awaiting H. Pylori serologies. Patient ok to be discharged from GI perspective. [facility] will sign-off; we will arrange outpatient follow-up; please call with any questions; thank you for the consultation.   Landry Dyke 06/06/2021, 2:26 PM   Cell [phone] If no answer or after 5 PM call [phone]

## 2021-06-06 NOTE — Progress Notes (Signed)
Pt had one small BM with small amount of bright red blood in toilet overnight

## 2021-06-07 ENCOUNTER — Inpatient Hospital Stay (HOSPITAL_COMMUNITY): Payer: Medicare PPO

## 2021-06-07 DIAGNOSIS — K922 Gastrointestinal hemorrhage, unspecified: Secondary | ICD-10-CM | POA: Diagnosis not present

## 2021-06-07 HISTORY — PX: IR EMBO ART  VEN HEMORR LYMPH EXTRAV  INC GUIDE ROADMAPPING: IMG5450

## 2021-06-07 HISTORY — PX: IR ANGIOGRAM SELECTIVE EACH ADDITIONAL VESSEL: IMG667

## 2021-06-07 HISTORY — PX: IR ANGIOGRAM FOLLOW UP STUDY: IMG697

## 2021-06-07 HISTORY — PX: IR ANGIOGRAM VISCERAL SELECTIVE: IMG657

## 2021-06-07 HISTORY — PX: IR US GUIDE VASC ACCESS RIGHT: IMG2390

## 2021-06-07 LAB — BASIC METABOLIC PANEL
Anion gap: 2 — ABNORMAL LOW (ref 5–15)
BUN: 19 mg/dL (ref 8–23)
CO2: 38 mmol/L — ABNORMAL HIGH (ref 22–32)
Calcium: 8.2 mg/dL — ABNORMAL LOW (ref 8.9–10.3)
Chloride: 95 mmol/L — ABNORMAL LOW (ref 98–111)
Creatinine, Ser: 0.64 mg/dL (ref 0.61–1.24)
GFR, Estimated: >60 mL/min (ref >60)
Glucose, Bld: 101 mg/dL — ABNORMAL HIGH (ref 70–99)
Potassium: 4 mmol/L (ref 3.5–5.1)
Sodium: 135 mmol/L (ref 135–145)

## 2021-06-07 LAB — BLOOD GAS, VENOUS
Acid-Base Excess: 10.5 mmol/L — ABNORMAL HIGH (ref 0.0–2.0)
Bicarbonate: 36.5 mmol/L — ABNORMAL HIGH (ref 20.0–28.0)
Drawn by: 1338
FIO2: 26
O2 Saturation: 93 %
Patient temperature: 37
pCO2, Ven: 70.5 mmHg (ref 44.0–60.0)
pH, Ven: 7.334 (ref 7.250–7.430)
pO2, Ven: 64.5 mmHg — ABNORMAL HIGH (ref 32.0–45.0)

## 2021-06-07 LAB — CBC
HCT: 37.7 % — ABNORMAL LOW (ref 39.0–52.0)
Hemoglobin: 12.1 g/dL — ABNORMAL LOW (ref 13.0–17.0)
MCH: 30.6 pg (ref 26.0–34.0)
MCHC: 32.1 g/dL (ref 30.0–36.0)
MCV: 95.2 fL (ref 80.0–100.0)
Platelets: 194 10*3/uL (ref 150–400)
RBC: 3.96 MIL/uL — ABNORMAL LOW (ref 4.22–5.81)
RDW: 12.9 % (ref 11.5–15.5)
WBC: 8.9 10*3/uL (ref 4.0–10.5)
nRBC: 0 % (ref 0.0–0.2)

## 2021-06-07 LAB — PROTIME-INR
INR: 1.1 (ref 0.8–1.2)
Prothrombin Time: 14.4 seconds (ref 11.4–15.2)

## 2021-06-07 LAB — ABO/RH
ABO/RH(D): O POS
ABO/RH(D): O POS

## 2021-06-07 LAB — MAGNESIUM: Magnesium: 1.6 mg/dL — ABNORMAL LOW (ref 1.7–2.4)

## 2021-06-07 LAB — H. PYLORI ANTIBODY, IGG: H Pylori IgG: 3.95 Index Value — ABNORMAL HIGH (ref 0.00–0.79)

## 2021-06-07 LAB — PHOSPHORUS: Phosphorus: 2 mg/dL — ABNORMAL LOW (ref 2.5–4.6)

## 2021-06-07 MED ORDER — FENTANYL CITRATE (PF) 100 MCG/2ML IJ SOLN
INTRAMUSCULAR | Status: AC
  Filled 2021-06-07: qty 2

## 2021-06-07 MED ORDER — IOHEXOL 300 MG/ML  SOLN
100.0000 mL | Freq: Once | INTRAMUSCULAR | Status: AC | PRN
  Administered 2021-06-07: 48 mL via INTRAVENOUS

## 2021-06-07 MED ORDER — K PHOS MONO-SOD PHOS DI & MONO 155-852-130 MG PO TABS
250.0000 mg | ORAL_TABLET | Freq: Three times a day (TID) | ORAL | Status: AC
  Administered 2021-06-07 – 2021-06-09 (×6): 250 mg via ORAL
  Filled 2021-06-07 (×7): qty 1

## 2021-06-07 MED ORDER — MAGNESIUM OXIDE -MG SUPPLEMENT 400 (240 MG) MG PO TABS
400.0000 mg | ORAL_TABLET | Freq: Two times a day (BID) | ORAL | Status: AC
  Administered 2021-06-07 – 2021-06-10 (×8): 400 mg via ORAL
  Filled 2021-06-07 (×8): qty 1

## 2021-06-07 MED ORDER — LIDOCAINE HCL 1 % IJ SOLN
INTRAMUSCULAR | Status: AC
  Filled 2021-06-07: qty 20

## 2021-06-07 MED ORDER — MIDAZOLAM HCL 2 MG/2ML IJ SOLN
INTRAMUSCULAR | Status: AC | PRN
  Administered 2021-06-07: 1 mg via INTRAVENOUS

## 2021-06-07 MED ORDER — MIDAZOLAM HCL 2 MG/2ML IJ SOLN
INTRAMUSCULAR | Status: AC
  Filled 2021-06-07: qty 2

## 2021-06-07 MED ORDER — IOHEXOL 350 MG/ML SOLN
100.0000 mL | Freq: Once | INTRAVENOUS | Status: AC | PRN
  Administered 2021-06-07: 100 mL via INTRAVENOUS

## 2021-06-07 MED ORDER — FENTANYL CITRATE (PF) 100 MCG/2ML IJ SOLN
INTRAMUSCULAR | Status: AC | PRN
  Administered 2021-06-07: 50 ug via INTRAVENOUS

## 2021-06-07 NOTE — Progress Notes (Signed)
[facility] Progress Note  Martin Mendez 75 y.o. 01-20-1946  CC:  Duodenal ulcer  Subjective: Patient denies abdominal pain, chest pain, shortness of breath. Denies nausea/vomiting.  Per RN, patient had several melenic stools overnight, as well as one episode of hematochezia. Has had one episode of melena today.  ROS : Review of Systems  Respiratory:  Negative for cough and shortness of breath.   Cardiovascular:  Negative for chest pain.  Gastrointestinal:  Positive for blood in stool and melena. Negative for abdominal pain, constipation, diarrhea, heartburn, nausea and vomiting.     Objective: Vital signs in last 24 hours: Vitals:   06/06/21 2145 06/07/21 0300  BP:  123/65  Pulse:  91  Resp:  16  Temp:  98.5 F (36.9 C)  SpO2: 97% 100%    Physical Exam:  General:  Sleeping but easily arouses to voice alone, cooperative, no distress  Head:  Normocephalic, without obvious abnormality, atraumatic  Eyes:  Anicteric sclera, EOMs intact  Lungs:   Clear to auscultation bilaterally, respirations unlabored  Heart:  Regular rate and rhythm, S1, S2 normal  Abdomen:   Soft, flat, non-tender, non-distended, bowel sounds active all four quadrants    Lab Results: Recent Labs    06/05/21 0321 06/07/21 0323  NA 139 135  K 4.0 4.0  CL 103 95*  CO2 31 38*  GLUCOSE 90 101*  BUN 22 19  CREATININE 0.77 0.64  CALCIUM 7.9* 8.2*  MG  --  1.6*  PHOS  --  2.0*   Recent Labs    06/05/21 0321  AST 15  ALT 10  ALKPHOS 41  BILITOT 0.5  PROT 4.8*  ALBUMIN 2.7*   Recent Labs    06/05/21 0321 06/05/21 1529 06/06/21 0810 06/07/21 0323  WBC 10.5  --  8.4 8.9  NEUTROABS 7.5  --   --   --   HGB 12.9*   < > 12.1* 12.1*  HCT 40.5   < > 39.2 37.7*  MCV 96.9  --  98.0 95.2  PLT 240  --  193 194   < > = values in this interval not displayed.   No results for input(s): LABPROT, INR in the last 72 hours.    Assessment: Duodenal ulcer: EGD 7/23 revealed one large  non-bleeding cratered duodenal ulcer with no stigmata of bleeding was found in the duodenal bulb. The lesion was 15 mm in largest dimension. Gastritis and duodenitis also seen. -Hgb 12.1 as of 03:23 this morning -BUN / Cr normal 19 / 0.64  Plan: Given multiple episodes of melena, as well as one episode of hematochezia, proceed with CT-A to rule out ongoing bleeding related to cratered duodenal ulcer.  If evidence of active bleeding, or if patient persistently bleeds, recommend IR consult for consideration of embolization.  Clear liquid diet, advance back to soft pending CT-A findings.  Continue to monitor H&H with transfusion as needed to maintain Hgb >7.  [facility] will follow.   Salley Slaughter PA-C 06/07/2021, 1:43 PM  Contact #  [phone]

## 2021-06-07 NOTE — Progress Notes (Signed)
Pt only able to tolerate Bipap for ~ 1.5 hrs. Pt has hx of PTSD & anxiety. Possibly could tolerate just a nasal mask or nasal pillow mask. Dayshift RN updated

## 2021-06-07 NOTE — Progress Notes (Signed)
CT-A positive for bleeding, presumably due to duodenal ulcer. Discussed with Dr. Pascal Lux (IR). Plan is to proceed with embolization, as per discussion with Dr. Paulita Fujita, due to location of ulcer, it may be difficult to treat endoscopically.

## 2021-06-07 NOTE — Progress Notes (Addendum)
PROGRESS NOTE    Martin Mendez  OVZ:[id number] DOB: 03-09-1946 DOA: 06/05/2021 PCP: Lavone Orn, MD   Chief Complaint  Patient presents with   Emesis   Hypotension    Brief Narrative: 75 year old male with PTSD, tobacco use, history of syncope with unclear cause, presented with hematemesis presyncope and melena and had drop in hemoglobin from 15 to 12 g, positive fecal test, 1 unit PRBC transfused, GI was consulted and patient was admitted.  Patient underwent EGD 7/23 that showed gastritis, duodenitis, nonbleeding duodenal ulcer. Hospital course complicated by acute hypoxic and hypercapnic respiratory failure ABG with pH 7.35 PCO2 80 chest x-ray hyperinflated lungs but patient fairly alert awake oriented not in distress BiPAP has been ordered for the night.  Subjective: Aler,awake on o2 [place] at 2 lc. No chest pain no shortness of breaht is aaox3, no nausea or vomiting Has dry mouth Did bipap for 1.5 hrs and then o2 overnight Bright red blood with dark stool- yesterday at shift change and this am- paging GI HB IS STABLE at 12.1  Assessment & Plan:  Acute GI bleeding underwent EGD 7/23 that showed gastritis, duodenitis, nonbleeding duodenal ulcer. Nursing reports BRBPR last night and this morning GI notified, they are planning for CTA today.  H&H overall stable monitor, keep on PPI twice daily. F/u H pyori.  Acute blood loss anemia Due to above.  Status post 1 unit PRBC.  Monitor H&H. Recent Labs  Lab 06/05/21 0321 06/05/21 1529 06/06/21 0810 06/07/21 0323  HGB 12.9* 13.8 12.1* 12.1*  HCT 40.5 43.3 39.2 37.7*   Acute hypoxic/hypercapnic respiratory failure likely from undiagnosed advanced COPD possible OSA suspect chronic CO2 retention Mental status overall stable despite CO2 in 80s.  Chest x-ray with hyperinflated lungs concern for undiagnosed COPD.  Tolerated BiPAP therapy.  Overnight currently on 2 L nasal cannula may need to go home on NIV.  Repeat ABG morning VBG shows  PCO2 in 70s  PTSD on Celexa Tobacco use disorder continue nicotine patch Hypomagnesemia replete Hypophosphatemia replete  Diet Order             DIET SOFT Room service appropriate? Yes; Fluid consistency: Thin  Diet effective now                   Patient's Body mass index is 22.96 kg/m. DVT prophylaxis: SCDs Start: 06/05/21 0536 Code Status:   Code Status: Full Code  Family Communication: plan of care discussed with patient at bedside.  Status is: Inpatient Remains inpatient due to GI bleeding Dispo:  Patient From: Home  Planned Disposition: Home  Medically stable for discharge: No  Unresulted Labs (From admission, onward)     Start     Ordered   06/06/21 0500  H. pylori antibody, IgG  Tomorrow morning,   R        06/05/21 1426            Medications reviewed:  Scheduled Meds:  citalopram  20 mg Oral q morning   loratadine  10 mg Oral Daily   nicotine  14 mg Transdermal Daily   pantoprazole  40 mg Oral BID AC   pneumococcal 23 valent vaccine  0.5 mL Intramuscular Tomorrow-1000   Continuous Infusions: Consultants:see note  Procedures:see note Antimicrobials: Anti-infectives (From admission, onward)    None      Culture/Microbiology    Component Value Date/Time   SDES URINE, RANDOM 10/10/2019 [place] 10/10/2019 1614   CULT  10/10/2019 1614  NO GROWTH Performed at [facility], [place] [address], [place], [place] [address]    REPTSTATUS 10/11/2019 FINAL 10/10/2019 1614    Other culture-see note  Objective: Vitals: Today's Vitals   06/06/21 2040 06/06/21 2110 06/06/21 2145 06/07/21 0300  BP: 123/65   123/65  Pulse: 88 85  91  Resp: 17 16  16   Temp: 98.5 F (36.9 C)   98.5 F (36.9 C)  TempSrc: Oral   Oral  SpO2: 97% 99% 97% 100%  Weight:      Height:      PainSc:        Intake/Output Summary (Last 24 hours) at 06/07/2021 0907 Last data filed at 06/07/2021 0657 Gross per 24 hour  Intake 555.34 ml  Output 1545  ml  Net -989.66 ml   Filed Weights   06/05/21 0251 06/05/21 1316  Weight: 72.6 kg 72.6 kg   Weight change:   Intake/Output from previous day: 07/24 0701 - 07/25 0700 In: 795.3 [P.O.:720; I.V.:75.3] Out: 1545 [Urine:1545] Intake/Output this shift: No intake/output data recorded. Filed Weights   06/05/21 0251 06/05/21 1316  Weight: 72.6 kg 72.6 kg   Examination: General exam: AAO x,3 pleasant. O n2l [place] HEENT:Oral mucosa moist, Ear/Nose WNL grossly,dentition normal. Respiratory system: bilaterally clear, diminished at bease, no use of accessory muscle, non tender. Cardiovascular system: S1 & S2 +no JVD. Gastrointestinal system: Abdomen soft, NT,ND, BS+. Nervous System:Alert, awake, moving extremities Extremities: no edema, distal peripheral pulses palpable.  Skin: No rashes,no icterus. MSK: Normal muscle bulk,tone, power Data Reviewed: I have personally reviewed following labs and imaging studies CBC: Recent Labs  Lab 06/05/21 0321 06/05/21 1529 06/06/21 0810 06/07/21 0323  WBC 10.5  --  8.4 8.9  NEUTROABS 7.5  --   --   --   HGB 12.9* 13.8 12.1* 12.1*  HCT 40.5 43.3 39.2 37.7*  MCV 96.9  --  98.0 95.2  PLT 240  --  193 785   Basic Metabolic Panel: Recent Labs  Lab 06/05/21 0321 06/07/21 0323  NA 139 135  K 4.0 4.0  CL 103 95*  CO2 31 38*  GLUCOSE 90 101*  BUN 22 19  CREATININE 0.77 0.64  CALCIUM 7.9* 8.2*  MG  --  1.6*  PHOS  --  2.0*   GFR: Estimated Creatinine Clearance: 80.7 mL/min (by C-G formula based on SCr of 0.64 mg/dL). Liver Function Tests: Recent Labs  Lab 06/05/21 0321  AST 15  ALT 10  ALKPHOS 41  BILITOT 0.5  PROT 4.8*  ALBUMIN 2.7*   No results for input(s): LIPASE, AMYLASE in the last 168 hours. No results for input(s): AMMONIA in the last 168 hours. Coagulation Profile: No results for input(s): INR, PROTIME in the last 168 hours. Cardiac Enzymes: No results for input(s): CKTOTAL, CKMB, CKMBINDEX, TROPONINI in the last 168  hours. BNP (last 3 results) No results for input(s): PROBNP in the last 8760 hours. HbA1C: No results for input(s): HGBA1C in the last 72 hours. CBG: No results for input(s): GLUCAP in the last 168 hours. Lipid Profile: No results for input(s): CHOL, HDL, LDLCALC, TRIG, CHOLHDL, LDLDIRECT in the last 72 hours. Thyroid Function Tests: No results for input(s): TSH, T4TOTAL, FREET4, T3FREE, THYROIDAB in the last 72 hours. Anemia Panel: No results for input(s): VITAMINB12, FOLATE, FERRITIN, TIBC, IRON, RETICCTPCT in the last 72 hours. Sepsis Labs: No results for input(s): PROCALCITON, LATICACIDVEN in the last 168 hours.  Recent Results (from the past 240 hour(s))  Resp Panel  by RT-PCR (Flu A&B, Covid) Nasopharyngeal Swab     Status: None   Collection Time: 06/05/21  3:21 AM   Specimen: Nasopharyngeal Swab; Nasopharyngeal(NP) swabs in vial transport medium  Result Value Ref Range Status   SARS Coronavirus 2 by RT PCR NEGATIVE NEGATIVE Final    Comment: (NOTE) SARS-CoV-2 target nucleic acids are NOT DETECTED.  The SARS-CoV-2 RNA is generally detectable in upper respiratory specimens during the acute phase of infection. The lowest concentration of SARS-CoV-2 viral copies this assay can detect is 138 copies/mL. A negative result does not preclude SARS-Cov-2 infection and should not be used as the sole basis for treatment or other patient management decisions. A negative result may occur with  improper specimen collection/handling, submission of specimen other than nasopharyngeal swab, presence of viral mutation(s) within the areas targeted by this assay, and inadequate number of viral copies(<138 copies/mL). A negative result must be combined with clinical observations, patient history, and epidemiological information. The expected result is Negative.  Fact Sheet for Patients:  EntrepreneurPulse.com.au  Fact Sheet for Healthcare Providers:   IncredibleEmployment.be  This test is no t yet approved or cleared by the [place] FDA and  has been authorized for detection and/or diagnosis of SARS-CoV-2 by FDA under an Emergency Use Authorization (EUA). This EUA will remain  in effect (meaning this test can be used) for the duration of the COVID-19 declaration under Section 564(b)(1) of the Act, 21 U.S.C.section 360bbb-3(b)(1), unless the authorization is terminated  or revoked sooner.       Influenza A by PCR NEGATIVE NEGATIVE Final   Influenza B by PCR NEGATIVE NEGATIVE Final    Comment: (NOTE) The Xpert Xpress SARS-CoV-2/FLU/RSV plus assay is intended as an aid in the diagnosis of influenza from Nasopharyngeal swab specimens and should not be used as a sole basis for treatment. Nasal washings and aspirates are unacceptable for Xpert Xpress SARS-CoV-2/FLU/RSV testing.  Fact Sheet for Patients: EntrepreneurPulse.com.au  Fact Sheet for Healthcare Providers: IncredibleEmployment.be  This test is not yet approved or cleared by the [place] FDA and has been authorized for detection and/or diagnosis of SARS-CoV-2 by FDA under an Emergency Use Authorization (EUA). This EUA will remain in effect (meaning this test can be used) for the duration of the COVID-19 declaration under Section 564(b)(1) of the Act, 21 U.S.C. section 360bbb-3(b)(1), unless the authorization is terminated or revoked.  Performed at [facility], [place] [address] [place], [place] [address]      Radiology Studies: No results found.   LOS: 1 day   Antonieta Pert, MD [facility]  06/07/2021, 9:07 AM

## 2021-06-07 NOTE — Procedures (Signed)
Pre-procedure Diagnosis: Upper GI bleeding Post-procedure Diagnosis: Same  Post mesenteric arteriogram and empiric percutaneous coil embolization of the GDA for acute GI bleeding demonstrated on preceding CTA.    Complications: None Immediate  EBL: None  Keep right leg straight for 4 hrs (until 2230).    Signed: Sandi Mariscal Pager: [phone] 06/07/2021, 6:32 PM

## 2021-06-07 NOTE — Progress Notes (Signed)
Pt GI and advised that pt having bright red blood with BM and stool is black.

## 2021-06-07 NOTE — Progress Notes (Signed)
Pt refused CPAP he stated he don't wear one at home and was not able to wear all night last night.

## 2021-06-07 NOTE — Progress Notes (Signed)
Placed patient on CPAP per order, 10.0 cm H20. Patient tolerating well at this time. RN aware.

## 2021-06-07 NOTE — Plan of Care (Signed)

## 2021-06-07 NOTE — Sedation Documentation (Signed)
Report given to [facility], RN for discharge from IR.

## 2021-06-07 NOTE — Progress Notes (Signed)
Spoke with Dr Maren Beach and advised critical value venous blood gas PCO2 70.5

## 2021-06-07 NOTE — Consult Note (Signed)
Chief Complaint: Patient was seen in consultation today for  Chief Complaint  Patient presents with   Emesis   Hypotension    Referring Physician(s): Dr. Paulita Fujita  Supervising Physician: Sandi Mariscal  Patient Status: [facility] - In-pt  History of Present Illness: Martin Mendez is a 75 y.o. male with a medical history significant for PTSD and syncope of unknown etiology. He presented to the hospital 06/05/21 with hematemesis, presyncope, melena and a drop in hemoglobin from 15 to 12. Hemoccult test was positive and he received 2 units PRBC; protonix infusion initiated. GI was consulted and an EGD on 06/05/21 revealed gastritis, duodenitis and a non-bleeding duodenal ulcer. His hospitalization has been complicated by acute hypoxic and hypercapnic respiratory failure as well as continued bright red blood per rectum.   A CTA was ordered today and was positive for acute intraluminal contrast at the level of the gastric antrum and duodenal bulb. Interventional Radiology was asked to evaluate this patient for an image-guided mesenteric angiogram with possible intervention, possible embolization. This case was reviewed and procedure approved by Dr. Pascal Lux.   Past Medical History:  Diagnosis Date   PTSD (post-traumatic stress disorder)     History reviewed. No pertinent surgical history.  Allergies: Penicillins and Sulfa antibiotics  Medications: Prior to Admission medications   Medication Sig Start Date End Date Taking? Authorizing Provider  acetaminophen (TYLENOL) 500 MG tablet Take 500 mg by mouth daily.   Yes [provider]  citalopram (CELEXA) 20 MG tablet TAKE 1 TABLET BY MOUTH ONCE DAILY IN THE MORNING Patient taking differently: Take 20 mg by mouth daily. 09/18/20  Yes Donnal Moat T, PA-C  loratadine (CLARITIN) 10 MG tablet Take 10 mg by mouth daily.   Yes [provider]  propranolol (INDERAL) 10 MG tablet TAKE 1 TABLET BY MOUTH ONCE DAILY AS NEEDED Patient taking  differently: Take 10 mg by mouth daily as needed (anxiety). 12/23/19  Yes Addison Lank, PA-C     History reviewed. No pertinent family history.  Social History   Socioeconomic History   Marital status: Single    Spouse name: Not on file   Number of children: Not on file   Years of education: Not on file   Highest education level: Not on file  Occupational History   Not on file  Tobacco Use   Smoking status: Every Day    Packs/day: 0.50    Types: Cigarettes   Smokeless tobacco: Never  Substance and Sexual Activity   Alcohol use: No   Drug use: No   Sexual activity: Not on file  Other Topics Concern   Not on file  Social History Narrative   Not on file   Social Determinants of Health   Financial Resource Strain: Not on file  Food Insecurity: Not on file  Transportation Needs: Not on file  Physical Activity: Not on file  Stress: Not on file  Social Connections: Not on file    Review of Systems: A 12 point ROS discussed and pertinent positives are indicated in the HPI above.  All other systems are negative.  Review of Systems  Constitutional:  Positive for appetite change and fatigue.  Respiratory:  Negative for cough and shortness of breath.   Cardiovascular:  Negative for chest pain and leg swelling.  Gastrointestinal:  Positive for blood in stool. Negative for abdominal pain, nausea and vomiting.  Neurological:  Negative for dizziness and headaches.   Vital Signs: BP (!) 109/59 (BP Location: Left Arm)  Pulse 88   Temp 98.5 F (36.9 C) (Oral)   Resp 16   Ht 5\' 10"  (1.778 m)   Wt 160 lb (72.6 kg)   SpO2 97%   BMI 22.96 kg/m   Physical Exam Constitutional:      General: He is not in acute distress. HENT:     Mouth/Throat:     Mouth: Mucous membranes are moist.     Pharynx: Oropharynx is clear.  Cardiovascular:     Rate and Rhythm: Normal rate and regular rhythm.     Pulses: Normal pulses.     Heart sounds: Normal heart sounds.  Pulmonary:      Effort: Pulmonary effort is normal.     Breath sounds: Normal breath sounds.  Abdominal:     General: Bowel sounds are normal.     Palpations: Abdomen is soft.     Tenderness: There is no abdominal tenderness.  Skin:    General: Skin is warm and dry.  Neurological:     Mental Status: He is alert and oriented to person, place, and time.    Imaging: DG Chest Portable 1 View  Result Date: 06/05/2021 CLINICAL DATA:  GI bleeding EXAM: PORTABLE CHEST 1 VIEW COMPARISON:  10/10/2019 FINDINGS: The heart size and mediastinal contours are within normal limits. Emphysema. Both lungs are clear. The visualized skeletal structures are unremarkable. IMPRESSION: No acute disease. COPD. Electronically Signed   By: Monte Fantasia M.D.   On: 06/05/2021 05:02   CT Angio Abd/Pel w/ and/or w/o  Result Date: 06/07/2021 CLINICAL DATA:  GI bleeding. EXAM: CTA ABDOMEN AND PELVIS WITHOUT AND WITH CONTRAST TECHNIQUE: Multidetector CT imaging of the abdomen and pelvis was performed using the standard protocol during bolus administration of intravenous contrast. Multiplanar reconstructed images and MIPs were obtained and reviewed to evaluate the vascular anatomy. CONTRAST:  130mL OMNIPAQUE IOHEXOL 350 MG/ML SOLN COMPARISON:  CT the chest, abdomen pelvis-10/10/2019 FINDINGS: VASCULAR Aorta: Scattered moderate amount of predominantly calcified atherosclerotic plaque within a mildly tortuous but normal caliber abdominal aorta, not resulting in a hemodynamically significant stenosis. No evidence of abdominal aortic dissection or periaortic stranding. Celiac: Widely patent without hemodynamically significant narrowing. There is an approximately 0.7 x 0.6 cm contained penetrating atherosclerotic ulcer involving the proximal aspect of the common hepatic artery (coronal image 42, series 9), with associated non flow limiting intimal web. The left gastric artery is noted to give rise to a replaced left hepatic artery. 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 SMA: There is a minimal amount of calcified atherosclerotic plaque involving the origin of this SMA, not resulting in hemodynamically significant stenosis. Conventional branching pattern. The distal tributaries the SMA appear widely patent without discrete intraluminal filling defect to suggest distal embolism. Renals: There is a tiny accessory right renal artery which supplies the inferior pole of the right kidney. Solitary left renal artery. There is a minimal amount of mixed calcified and noncalcified atherosclerotic plaque involving the origin of left renal artery, not resulting in a hemodynamically significant stenosis. No vessel irregularity to suggest FMD. IMA: Remains patent. Inflow: There is a minimal amount of eccentric predominantly calcified atherosclerotic plaque involving the bilateral common iliac arteries, not resulting in hemodynamically significant stenosis. The bilateral internal iliac arteries are disease though patent and of normal caliber. The bilateral external iliac arteries are of normal caliber and widely patent without a hemodynamically significant narrowing.  Proximal Outflow: There is a moderate amount of eccentric calcified and noncalcified atherosclerotic plaque involving the bilateral common femoral arteries, not resulting in a hemodynamically significant stenosis. The imaged portions of the bilateral deep and superficial femoral arteries are normal caliber and widely patent without hemodynamically significant narrowing. Veins: The IVC and pelvic venous systems appear widely patent. Review of the MIP images confirms the above findings. _________________________________________________________ NON-VASCULAR Lower chest: Limited visualization of the lower thorax demonstrates trace bilateral pleural effusions with associated  bibasilar consolidative opacities. Normal heart size.  No pericardial effusion. Hepatobiliary: Normal hepatic contour. Redemonstrated punctate cysts within the dome of the liver, similar to the 09/2019 examination. Additional subcentimeter hypoattenuating hepatic lesions are too small to adequately characterize though favored to represent additional hepatic cysts. No discrete worrisome hepatic lesions. Normal appearance of the gallbladder given degree distention. No radiopaque gallstones. No intra or extrahepatic biliary duct dilatation. No ascites. Pancreas: Normal appearance of the pancreas. Spleen: Normal appearance of the spleen. Adrenals/Urinary Tract: Nonobstructing renal stones are seen bilaterally right significantly greater than left including a dominant nonobstructing layering stone within the posteroinferior aspect of the right kidney measuring 1.8 x 0.9 cm (image 35, series 6, morphologically similar to the 09/2019 examination. Bilateral subcentimeter hypoattenuating renal lesions are too small to adequately characterize though favored to represent renal cysts. No urinary obstruction or perinephric stranding. Normal appearance the bilateral adrenal glands. There is minimal mass effect of the prostate gland upon the undersurface of the urinary bladder. Otherwise, normal appearance of the urinary bladder given degree of distention. Stomach/Bowel: There is discrete intraluminal contrast extravasation at the level of the gastric antrum, duodenal bulb and descending portion of the duodenum (axial images 43 through 57, series 7; coronal images 36 through 48, series 9), likely compatible with provided history of duodenal ulcer and the etiology of reported history of GI bleeding. Circumferential bowel wall thickening involving several loops of mid small bowel within the left mid hemiabdomen (representative images 23 through 30, series 10), not resulting in enteric obstruction, likely reactive in etiology given  upstream GI bleeding. Scattered colonic diverticulosis without evidence of superimposed acute diverticulitis. No significant hiatal hernia. No pneumoperitoneum, pneumatosis or portal venous gas. Lymphatic: No bulky retroperitoneal, mesenteric, pelvic or inguinal lymphadenopathy. Reproductive: Dystrophic calcifications within a mildly enlarged prostate gland which has minimal mass effect on the undersurface of the urinary bladder. Other: Mild diffuse body wall anasarca, most conspicuous about the lateral aspects of the proximal thighs bilaterally. Musculoskeletal: No acute or aggressive osseous abnormalities. Mild-to-moderate multilevel lumbar spine DDD, worse at L4-L5 and L5-S1 with disc space height loss, endplate irregularity and sclerosis. IMPRESSION: 1. Examination is positive for acute intraluminal contrast at the level of the gastric antrum and duodenal bulb, presumably secondary to reported history of duodenal ulcer. The exact etiology of the gastric antral and duodenal bleeding is not definitely identified though presumably secondary to a small pancreaticoduodenal branch arising from the GDA. 2. Scattered atherosclerotic plaque within normal caliber abdominal aorta, not resulting in a hemodynamically significant stenosis. Aortic Atherosclerosis (ICD10-I70.0). 3. 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 4. Non conventional mesenteric anatomy as above. Critical Value/emergent results were called by telephone at the time of interpretation on 06/07/2021 at 4:17 pm to provider Salley Slaughter , who verbally acknowledged these results. Electronically Signed   By: Sandi Mariscal M.D.   On: 06/07/2021 16:26    Labs:  CBC: Recent Labs  06/05/21 0321 06/05/21 1529 06/06/21 0810 06/07/21 0323  WBC 10.5  --  8.4 8.9  HGB 12.9* 13.8 12.1* 12.1*  HCT 40.5 43.3 39.2 37.7*  PLT 240  --   193 194    COAGS: No results for input(s): INR, APTT in the last 8760 hours.  BMP: Recent Labs    06/05/21 0321 06/07/21 0323  NA 139 135  K 4.0 4.0  CL 103 95*  CO2 31 38*  GLUCOSE 90 101*  BUN 22 19  CALCIUM 7.9* 8.2*  CREATININE 0.77 0.64  GFRNONAA >60 >60    LIVER FUNCTION TESTS: Recent Labs    06/05/21 0321  BILITOT 0.5  AST 15  ALT 10  ALKPHOS 41  PROT 4.8*  ALBUMIN 2.7*    TUMOR MARKERS: No results for input(s): AFPTM, CEA, CA199, CHROMGRNA in the last 8760 hours.  Assessment and Plan:  GI bleed: Martin Mendez, 75 year old male, is scheduled for today for an urgent image-guided mesenteric angiogram with possible intervention, possible embolization.   The Risks and benefits of embolization were discussed with the patient including, but not limited to bleeding, infection, vascular injury, post operative pain, or contrast induced renal failure.  This procedure involves the use of X-rays and because of the nature of the planned procedure, it is possible that we will have prolonged use of X-ray fluoroscopy.  Potential radiation risks to you include (but are not limited to) the following: - A slightly elevated risk for cancer several years later in life. This risk is typically less than 0.5% percent. This risk is low in comparison to the normal incidence of human cancer, which is 33% for women and 50% for men according to the [place]. - Radiation induced injury can include skin redness, resembling a rash, tissue breakdown / ulcers and hair loss (which can be temporary or permanent).   The likelihood of either of these occurring depends on the difficulty of the procedure and whether you are sensitive to radiation due to previous procedures, disease, or genetic conditions.   IF your procedure requires a prolonged use of radiation, you will be notified and given written instructions for further action.  It is your responsibility to monitor the  irradiated area for the 2 weeks following the procedure and to notify your physician if you are concerned that you have suffered a radiation induced injury.    All of the patient's questions were answered, patient is agreeable to proceed.  Consent signed and in chart. The patient has been NPO since the evening of 06/06/21.   Thank you for this interesting consult.  I greatly enjoyed meeting Martin Mendez and look forward to participating in their care.  A copy of this report was sent to the requesting provider on this date.  Electronically Signed: Soyla Dryer, AGACNP-BC [phone] 06/07/2021, 4:28 PM   I spent a total of 20 Minutes    in face to face in clinical consultation, greater than 50% of which was counseling/coordinating care for GI bleed; mesenteric angiogram with possible intervention.

## 2021-06-08 DIAGNOSIS — K922 Gastrointestinal hemorrhage, unspecified: Secondary | ICD-10-CM

## 2021-06-08 LAB — BASIC METABOLIC PANEL
Anion gap: 5 (ref 5–15)
BUN: 25 mg/dL — ABNORMAL HIGH (ref 8–23)
CO2: 37 mmol/L — ABNORMAL HIGH (ref 22–32)
Calcium: 8.3 mg/dL — ABNORMAL LOW (ref 8.9–10.3)
Chloride: 95 mmol/L — ABNORMAL LOW (ref 98–111)
Creatinine, Ser: 0.62 mg/dL (ref 0.61–1.24)
GFR, Estimated: >60 mL/min (ref >60)
Glucose, Bld: 100 mg/dL — ABNORMAL HIGH (ref 70–99)
Potassium: 3.7 mmol/L (ref 3.5–5.1)
Sodium: 137 mmol/L (ref 135–145)

## 2021-06-08 LAB — CBC
HCT: 35.1 % — ABNORMAL LOW (ref 39.0–52.0)
Hemoglobin: 11.3 g/dL — ABNORMAL LOW (ref 13.0–17.0)
MCH: 30.1 pg (ref 26.0–34.0)
MCHC: 32.2 g/dL (ref 30.0–36.0)
MCV: 93.6 fL (ref 80.0–100.0)
Platelets: 199 10*3/uL (ref 150–400)
RBC: 3.75 MIL/uL — ABNORMAL LOW (ref 4.22–5.81)
RDW: 12.6 % (ref 11.5–15.5)
WBC: 9.6 10*3/uL (ref 4.0–10.5)
nRBC: 0 % (ref 0.0–0.2)

## 2021-06-08 LAB — BLOOD GAS, ARTERIAL
Acid-Base Excess: 10.4 mmol/L — ABNORMAL HIGH (ref 0.0–2.0)
Acid-Base Excess: 14.8 mmol/L — ABNORMAL HIGH (ref 0.0–2.0)
Bicarbonate: 37.3 mmol/L — ABNORMAL HIGH (ref 20.0–28.0)
Bicarbonate: 41.4 mmol/L — ABNORMAL HIGH (ref 20.0–28.0)
Drawn by: 270271
FIO2: 24
FIO2: 24
O2 Saturation: 96 %
O2 Saturation: 98.3 %
Patient temperature: 37
Patient temperature: 36.8
pCO2 arterial: 82.9 mmHg (ref 32.0–48.0)
pCO2 arterial: 81.4 mmHg (ref 32.0–48.0)
pH, Arterial: 7.275 — ABNORMAL LOW (ref 7.350–7.450)
pH, Arterial: 7.325 — ABNORMAL LOW (ref 7.350–7.450)
pO2, Arterial: 85.4 mmHg (ref 83.0–108.0)
pO2, Arterial: 105 mmHg (ref 83.0–108.0)

## 2021-06-08 MED ORDER — PANTOPRAZOLE SODIUM 40 MG IV SOLR
40.0000 mg | Freq: Two times a day (BID) | INTRAVENOUS | Status: DC
  Administered 2021-06-08 (×2): 40 mg via INTRAVENOUS
  Filled 2021-06-08 (×2): qty 40

## 2021-06-08 MED ORDER — BIOTENE DRY MOUTH MT LIQD: 15.0000 mL | OROMUCOSAL | Status: DC | PRN

## 2021-06-08 MED ORDER — ALBUTEROL SULFATE (2.5 MG/3ML) 0.083% IN NEBU: 2.5000 mg | INHALATION_SOLUTION | Freq: Four times a day (QID) | RESPIRATORY_TRACT | Status: DC | PRN

## 2021-06-08 MED ORDER — IPRATROPIUM-ALBUTEROL 0.5-2.5 (3) MG/3ML IN SOLN
3.0000 mL | RESPIRATORY_TRACT | Status: DC
  Administered 2021-06-08 – 2021-06-09 (×5): 3 mL via RESPIRATORY_TRACT
  Filled 2021-06-08 (×4): qty 3

## 2021-06-08 MED ORDER — AYR SALINE NASAL NA GEL
1.0000 "application " | NASAL | Status: DC
  Administered 2021-06-08 – 2021-06-12 (×18): 1 via NASAL
  Filled 2021-06-08: qty 14.1

## 2021-06-08 NOTE — Progress Notes (Signed)
RT at bedside per call from RN to check on patient CPAP. Patient states its "leaking" and blowing air out his mouth. Patient is wearing nasal mask (dreamware) per CCM.  RT placed a Res Med nasal mask for patient to try. At this time patient states "it seems to feel better". Patient aware to call for assistance if any issues. RN aware

## 2021-06-08 NOTE — Progress Notes (Signed)
PROGRESS NOTE    Martin Mendez  GGY:[id number] DOB: 12-09-1945 DOA: 06/05/2021 PCP: Lavone Orn, MD   Chief Complaint  Patient presents with   Emesis   Hypotension    Brief Narrative: 75 year old male with PTSD, tobacco use, history of syncope with unclear cause, presented with hematemesis presyncope and melena and had drop in hemoglobin from 15 to 12 g, positive fecal test, 1 unit PRBC transfused, GI was consulted and patient was admitted.  Patient underwent EGD 7/23 that showed gastritis, duodenitis, nonbleeding duodenal ulcer. Hospital course complicated by acute hypoxic and hypercapnic respiratory failure ABG with pH 7.35 PCO2 80 chest x-ray hyperinflated lungs but patient fairly alert awake oriented not in distress BiPAP has been ordered for the night. Patient had 2 episodes of bright red blood per rectum GI was notified and CT angio was positive subsequently underwent mesenteric arteriogram and percutaneous coil embolization of GDA 7/25  Subjective:  Overnight did not tolerate/did not use CPAP , ABG SHOWS chronic CO2 retention [phone] Hemoglobin down trended  Assessment & Plan:  Acute GI bleeding: underwent EGD 7/23 that showed gastritis, duodenitis, nonbleeding duodenal ulcer. had 2 episodes of bright red blood per rectum GI was notified and CT angio was positive subsequently underwent mesenteric arteriogram and percutaneous coil embolization of GDA 7/25.  Slightly downtrending.  Continue PPI, H pylori  treatment as per GI (for positive serology once on soft diet- defer to GI).  On clear liquid diet advance per GI.  Acute blood loss anemia: 2/2 #1.status post 1 unit PRBC.  Monitor H&H. Recent Labs  Lab 06/05/21 0321 06/05/21 1529 06/06/21 0810 06/07/21 0323 06/08/21 0101  HGB 12.9* 13.8 12.1* 12.1* 11.3*  HCT 40.5 43.3 39.2 37.7* 35.1*  Acute hypoxic/hypercapnic respiratory failure likely from undiagnosed advanced COPD possible OSA suspect chronic CO2  retention Despite ABG showing PCO2 in  80, ph 7.27-mentating well, did not tolerate CPAP overnight. Chest x-ray with hyperinflated lungs concern for undiagnosed COPD.  Patient does have longstanding smoking history previously.  Will request pulmonary evaluation.  Not sure if he will able to have/tolerate BiPAP at home await further pulmonary recommendation  PTSD mood is stable, on Celexa Tobacco use disorder continue nicotine patch.  Cessation advised counseling Hypomagnesemia/Hypophosphatemia continue PO replacement  Diet Order             Diet full liquid Room service appropriate? Yes; Fluid consistency: Thin  Diet effective now                   Patient's Body mass index is 22.96 kg/m. DVT prophylaxis: SCDs Start: 06/05/21 0536 Code Status:   Code Status: Full Code  Family Communication: plan of care discussed with patient at bedside.  Status is: Inpatient Remains hospitalized as inpatient status Dispo:  Patient From: Home  Planned Disposition: Home  Medically stable for discharge: No  Unresulted Labs (From admission, onward)    None       Medications reviewed:  Scheduled Meds:  citalopram  20 mg Oral q morning   loratadine  10 mg Oral Daily   magnesium oxide  400 mg Oral BID   nicotine  14 mg Transdermal Daily   pantoprazole (PROTONIX) IV  40 mg Intravenous Q12H   phosphorus  250 mg Oral TID   pneumococcal 23 valent vaccine  0.5 mL Intramuscular Tomorrow-1000   Continuous Infusions: Consultants:see note  Procedures:see note Antimicrobials: Anti-infectives (From admission, onward)    None      Culture/Microbiology    Component  Value Date/Time   SDES URINE, RANDOM 10/10/2019 1614   SPECREQUEST NONE 10/10/2019 1614   CULT  10/10/2019 1614    NO GROWTH Performed at [facility], [place] [address], [place], [place] [address]    REPTSTATUS 10/11/2019 FINAL 10/10/2019 1614    Other culture-see note  Objective: Vitals: Today's Vitals   06/07/21  2121 06/07/21 2152 06/07/21 2222 06/08/21 0453  BP: 119/68 112/64 120/74 113/67  Pulse: 89 87 87 97  Resp:    18  Temp:    98.5 F (36.9 C)  TempSrc:    Oral  SpO2: 99% 98% 99% 96%  Weight:      Height:      PainSc:        Intake/Output Summary (Last 24 hours) at 06/08/2021 1154 Last data filed at 06/08/2021 0033 Gross per 24 hour  Intake 240 ml  Output 650 ml  Net -410 ml   Filed Weights   06/05/21 0251 06/05/21 1316  Weight: 72.6 kg 72.6 kg   Weight change:   Intake/Output from previous day: 07/25 0701 - 07/26 0700 In: 240 [P.O.:240] Out: 650 [Urine:650] Intake/Output this shift: No intake/output data recorded. Filed Weights   06/05/21 0251 06/05/21 1316  Weight: 72.6 kg 72.6 kg   Examination: General exam: AAOx 3 older than stated age, weak appearing. HEENT:Oral mucosa moist, Ear/Nose WNL grossly, dentition normal. Respiratory system: bilaterally diminished, , no use of accessory muscle Cardiovascular system: S1 & S2 +, No JVD,. Gastrointestinal system: Abdomen soft, NT,ND, BS+ Nervous System:Alert, awake, moving extremities and grossly nonfocal Extremities: no edema, distal peripheral pulses palpable.  Skin: No rashes,no icterus. MSK: Normal muscle bulk,tone, power  Data Reviewed: I have personally reviewed following labs and imaging studies CBC: Recent Labs  Lab 06/05/21 0321 06/05/21 1529 06/06/21 0810 06/07/21 0323 06/08/21 0101  WBC 10.5  --  8.4 8.9 9.6  NEUTROABS 7.5  --   --   --   --   HGB 12.9* 13.8 12.1* 12.1* 11.3*  HCT 40.5 43.3 39.2 37.7* 35.1*  MCV 96.9  --  98.0 95.2 93.6  PLT 240  --  193 194 017   Basic Metabolic Panel: Recent Labs  Lab 06/05/21 0321 06/07/21 0323 06/08/21 0101  NA 139 135 137  K 4.0 4.0 3.7  CL 103 95* 95*  CO2 31 38* 37*  GLUCOSE 90 101* 100*  BUN 22 19 25*  CREATININE 0.77 0.64 0.62  CALCIUM 7.9* 8.2* 8.3*  MG  --  1.6*  --   PHOS  --  2.0*  --    GFR: Estimated Creatinine Clearance: 80.7 mL/min (by  C-G formula based on SCr of 0.62 mg/dL). Liver Function Tests: Recent Labs  Lab 06/05/21 0321  AST 15  ALT 10  ALKPHOS 41  BILITOT 0.5  PROT 4.8*  ALBUMIN 2.7*   No results for input(s): LIPASE, AMYLASE in the last 168 hours. No results for input(s): AMMONIA in the last 168 hours. Coagulation Profile: Recent Labs  Lab 06/07/21 1633  INR 1.1   Cardiac Enzymes: No results for input(s): CKTOTAL, CKMB, CKMBINDEX, TROPONINI in the last 168 hours. BNP (last 3 results) No results for input(s): PROBNP in the last 8760 hours. HbA1C: No results for input(s): HGBA1C in the last 72 hours. CBG: No results for input(s): GLUCAP in the last 168 hours. Lipid Profile: No results for input(s): CHOL, HDL, LDLCALC, TRIG, CHOLHDL, LDLDIRECT in the last 72 hours. Thyroid Function Tests: No results for input(s): TSH,  T4TOTAL, FREET4, T3FREE, THYROIDAB in the last 72 hours. Anemia Panel: No results for input(s): VITAMINB12, FOLATE, FERRITIN, TIBC, IRON, RETICCTPCT in the last 72 hours. Sepsis Labs: No results for input(s): PROCALCITON, LATICACIDVEN in the last 168 hours.  Recent Results (from the past 240 hour(s))  Resp Panel by RT-PCR (Flu A&B, Covid) Nasopharyngeal Swab     Status: None   Collection Time: 06/05/21  3:21 AM   Specimen: Nasopharyngeal Swab; Nasopharyngeal(NP) swabs in vial transport medium  Result Value Ref Range Status   SARS Coronavirus 2 by RT PCR NEGATIVE NEGATIVE Final    Comment: (NOTE) SARS-CoV-2 target nucleic acids are NOT DETECTED.  The SARS-CoV-2 RNA is generally detectable in upper respiratory specimens during the acute phase of infection. The lowest concentration of SARS-CoV-2 viral copies this assay can detect is 138 copies/mL. A negative result does not preclude SARS-Cov-2 infection and should not be used as the sole basis for treatment or other patient management decisions. A negative result may occur with  improper specimen collection/handling, submission  of specimen other than nasopharyngeal swab, presence of viral mutation(s) within the areas targeted by this assay, and inadequate number of viral copies(<138 copies/mL). A negative result must be combined with clinical observations, patient history, and epidemiological information. The expected result is Negative.  Fact Sheet for Patients:  EntrepreneurPulse.com.au  Fact Sheet for Healthcare Providers:  IncredibleEmployment.be  This test is no t yet approved or cleared by the [place] FDA and  has been authorized for detection and/or diagnosis of SARS-CoV-2 by FDA under an Emergency Use Authorization (EUA). This EUA will remain  in effect (meaning this test can be used) for the duration of the COVID-19 declaration under Section 564(b)(1) of the Act, 21 U.S.C.section 360bbb-3(b)(1), unless the authorization is terminated  or revoked sooner.       Influenza A by PCR NEGATIVE NEGATIVE Final   Influenza B by PCR NEGATIVE NEGATIVE Final    Comment: (NOTE) The Xpert Xpress SARS-CoV-2/FLU/RSV plus assay is intended as an aid in the diagnosis of influenza from Nasopharyngeal swab specimens and should not be used as a sole basis for treatment. Nasal washings and aspirates are unacceptable for Xpert Xpress SARS-CoV-2/FLU/RSV testing.  Fact Sheet for Patients: EntrepreneurPulse.com.au  Fact Sheet for Healthcare Providers: IncredibleEmployment.be  This test is not yet approved or cleared by the [place] FDA and has been authorized for detection and/or diagnosis of SARS-CoV-2 by FDA under an Emergency Use Authorization (EUA). This EUA will remain in effect (meaning this test can be used) for the duration of the COVID-19 declaration under Section 564(b)(1) of the Act, 21 U.S.C. section 360bbb-3(b)(1), unless the authorization is terminated or revoked.  Performed at [facility], [place] [address],  [place], [place] [address]      Radiology Studies: IR Angiogram Visceral Selective  Result Date: 06/08/2021 INDICATION: Upper GI bleeding secondary to duodenal ulcer. Please perform mesenteric arteriogram and percutaneous embolization as indicated. EXAM: 1. ULTRASOUND GUIDANCE FOR ARTERIAL ACCESS 2. SELECTIVE CELIAC ARTERIOGRAM 3. SELECTIVE COMMON HEPATIC ARTERIOGRAM 4. SELECTIVE GASTRODUODENAL ARTERIOGRAM AND PERCUTANEOUS COIL EMBOLIZATION COMPARISON:  CTA abdomen and pelvis-earlier same day MEDICATIONS: None ANESTHESIA/SEDATION: Moderate (conscious) sedation was employed during this procedure. A total of Versed 1 mg and Fentanyl 50 mcg was administered intravenously. Moderate Sedation Time: 59 minutes. The patient's level of consciousness and vital signs were monitored continuously by radiology nursing throughout the procedure under my direct supervision. CONTRAST:  100 cc Omnipaque 300 FLUOROSCOPY TIME:  19 minutes, 18 seconds (  315 mGy) COMPLICATIONS: None immediate. PROCEDURE: Informed consent was obtained from the patient following explanation of the procedure, risks, benefits and alternatives. All questions were addressed. A time out was performed prior to the initiation of the procedure. Maximal barrier sterile technique utilized including caps, mask, sterile gowns, sterile gloves, large sterile drape, hand hygiene, and Betadine prep. The right femoral head was marked fluoroscopically. Under sterile conditions and local anesthesia, the right common femoral artery access was performed with a micropuncture needle. Under direct ultrasound guidance, the right common femoral was accessed with a micropuncture kit. An ultrasound image was saved for documentation purposes. This allowed for placement of a 5-French vascular sheath. A limited arteriogram was performed through the side arm of the sheath confirming appropriate access within the right common femoral artery. Over a Bentson wire, a Mickelson catheter was  advanced the caudal aspect of the thoracic aorta where was reformed, back bled and flushed. The Mickelson catheter was then utilized to select the celiac artery and a selective celiac arteriogram was performed. With the use of a fathom 14 microwire, a regular Renegade microcatheter was advanced to the level of the gastroduodenal artery and a selective gastroduodenal arteriogram was performed. Next, the gastroduodenal artery was percutaneously coil embolized with multiple overlapping 5 mm, 6 mm, 8 mm and 10 mm diameter interlock coils to near the vessel's origin. Multiple selective arteriograms were performed during coil embolization. The microcatheter was retracted to the level of the common hepatic artery and a post embolization selective common hepatic arteriogram was performed. The microcatheter was removed and a completion celiac arteriogram was performed through the [facility] catheter. Images were reviewed and the procedure was terminated. All wires, catheters and sheaths were removed from the patient. Hemostasis was achieved at the right groin access site with deployment of an ExoSeal closure device and manual compression. A dressing was applied. The patient tolerated the procedure well without immediate post procedural complication. FINDINGS: Selective celiac arteriogram redemonstrates a replaced left hepatic artery arising from the left gastric artery. 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 Selective gastroduodenal arteriogram demonstrates an expected branching pattern giving rise to several pancreaticoduodenal arteries supplying the duodenal bulb descending portion of the duodenum. The GDA terminates in a hypertrophied right gastroepiploic artery. While a discrete area contrast extravasation was not identified, the GDA was prophylactically percutaneously coil embolized to near its origin given the endoscopic  confirmation of the duodenal ulcer and definitive contrast extravasation demonstrated on preceding CTA. Completion selective common hepatic arteriogram and celiac arteriogram demonstrates a technically excellent results with occlusion of the GDA. IMPRESSION: 1. Technically successful prophylactic percutaneous coil embolization of the GDA secondary to bleeding duodenal ulcer demonstrated on preceding endoscopy and CTA. 2. 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 PLAN: - The patient is to remain flat for 4 hours with right leg straight. - As the patient was bleeding at the time of preceding CTA, he will likely experience several bloody/melanotic bowel movements and may require additional resuscitation, however ultimately I am hopeful he will stabilize in the coming days. - Repeat endoscopy may be performed at the discretion of the GI service as indicated. Electronically Signed   By: Sandi Mariscal M.D.   On: 06/08/2021 08:04   IR Angiogram Selective Each Additional Vessel  Result Date: 06/08/2021 INDICATION: Upper GI bleeding secondary to duodenal ulcer. Please perform  mesenteric arteriogram and percutaneous embolization as indicated. EXAM: 1. ULTRASOUND GUIDANCE FOR ARTERIAL ACCESS 2. SELECTIVE CELIAC ARTERIOGRAM 3. SELECTIVE COMMON HEPATIC ARTERIOGRAM 4. SELECTIVE GASTRODUODENAL ARTERIOGRAM AND PERCUTANEOUS COIL EMBOLIZATION COMPARISON:  CTA abdomen and pelvis-earlier same day MEDICATIONS: None ANESTHESIA/SEDATION: Moderate (conscious) sedation was employed during this procedure. A total of Versed 1 mg and Fentanyl 50 mcg was administered intravenously. Moderate Sedation Time: 59 minutes. The patient's level of consciousness and vital signs were monitored continuously by radiology nursing throughout the procedure under my direct supervision. CONTRAST:  100 cc Omnipaque 300 FLUOROSCOPY TIME:  19  minutes, 18 seconds (341 mGy) COMPLICATIONS: None immediate. PROCEDURE: Informed consent was obtained from the patient following explanation of the procedure, risks, benefits and alternatives. All questions were addressed. A time out was performed prior to the initiation of the procedure. Maximal barrier sterile technique utilized including caps, mask, sterile gowns, sterile gloves, large sterile drape, hand hygiene, and Betadine prep. The right femoral head was marked fluoroscopically. Under sterile conditions and local anesthesia, the right common femoral artery access was performed with a micropuncture needle. Under direct ultrasound guidance, the right common femoral was accessed with a micropuncture kit. An ultrasound image was saved for documentation purposes. This allowed for placement of a 5-French vascular sheath. A limited arteriogram was performed through the side arm of the sheath confirming appropriate access within the right common femoral artery. Over a Bentson wire, a Mickelson catheter was advanced the caudal aspect of the thoracic aorta where was reformed, back bled and flushed. The Mickelson catheter was then utilized to select the celiac artery and a selective celiac arteriogram was performed. With the use of a fathom 14 microwire, a regular Renegade microcatheter was advanced to the level of the gastroduodenal artery and a selective gastroduodenal arteriogram was performed. Next, the gastroduodenal artery was percutaneously coil embolized with multiple overlapping 5 mm, 6 mm, 8 mm and 10 mm diameter interlock coils to near the vessel's origin. Multiple selective arteriograms were performed during coil embolization. The microcatheter was retracted to the level of the common hepatic artery and a post embolization selective common hepatic arteriogram was performed. The microcatheter was removed and a completion celiac arteriogram was performed through the [facility] catheter. Images were reviewed  and the procedure was terminated. All wires, catheters and sheaths were removed from the patient. Hemostasis was achieved at the right groin access site with deployment of an ExoSeal closure device and manual compression. A dressing was applied. The patient tolerated the procedure well without immediate post procedural complication. FINDINGS: Selective celiac arteriogram redemonstrates a replaced left hepatic artery arising from the left gastric artery. 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 Selective gastroduodenal arteriogram demonstrates an expected branching pattern giving rise to several pancreaticoduodenal arteries supplying the duodenal bulb descending portion of the duodenum. The GDA terminates in a hypertrophied right gastroepiploic artery. While a discrete area contrast extravasation was not identified, the GDA was prophylactically percutaneously coil embolized to near its origin given the endoscopic confirmation of the duodenal ulcer and definitive contrast extravasation demonstrated on preceding CTA. Completion selective common hepatic arteriogram and celiac arteriogram demonstrates a technically excellent results with occlusion of the GDA. IMPRESSION: 1. Technically successful prophylactic percutaneous coil embolization of the GDA secondary to bleeding duodenal ulcer demonstrated on preceding endoscopy and CTA. 2. 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 PLAN: -  The patient is to remain flat for 4 hours with right leg straight. - As the patient was bleeding at the time of preceding CTA, he will likely experience several bloody/melanotic bowel movements and may require additional resuscitation, however ultimately I am hopeful he will stabilize in the coming days. - Repeat endoscopy may be performed  at the discretion of the GI service as indicated. Electronically Signed   By: Sandi Mariscal M.D.   On: 06/08/2021 08:04   IR Angiogram Follow Up Study  Result Date: 06/08/2021 INDICATION: Upper GI bleeding secondary to duodenal ulcer. Please perform mesenteric arteriogram and percutaneous embolization as indicated. EXAM: 1. ULTRASOUND GUIDANCE FOR ARTERIAL ACCESS 2. SELECTIVE CELIAC ARTERIOGRAM 3. SELECTIVE COMMON HEPATIC ARTERIOGRAM 4. SELECTIVE GASTRODUODENAL ARTERIOGRAM AND PERCUTANEOUS COIL EMBOLIZATION COMPARISON:  CTA abdomen and pelvis-earlier same day MEDICATIONS: None ANESTHESIA/SEDATION: Moderate (conscious) sedation was employed during this procedure. A total of Versed 1 mg and Fentanyl 50 mcg was administered intravenously. Moderate Sedation Time: 59 minutes. The patient's level of consciousness and vital signs were monitored continuously by radiology nursing throughout the procedure under my direct supervision. CONTRAST:  100 cc Omnipaque 300 FLUOROSCOPY TIME:  19 minutes, 18 seconds (662 mGy) COMPLICATIONS: None immediate. PROCEDURE: Informed consent was obtained from the patient following explanation of the procedure, risks, benefits and alternatives. All questions were addressed. A time out was performed prior to the initiation of the procedure. Maximal barrier sterile technique utilized including caps, mask, sterile gowns, sterile gloves, large sterile drape, hand hygiene, and Betadine prep. The right femoral head was marked fluoroscopically. Under sterile conditions and local anesthesia, the right common femoral artery access was performed with a micropuncture needle. Under direct ultrasound guidance, the right common femoral was accessed with a micropuncture kit. An ultrasound image was saved for documentation purposes. This allowed for placement of a 5-French vascular sheath. A limited arteriogram was performed through the side arm of the sheath confirming appropriate access within the right  common femoral artery. Over a Bentson wire, a Mickelson catheter was advanced the caudal aspect of the thoracic aorta where was reformed, back bled and flushed. The Mickelson catheter was then utilized to select the celiac artery and a selective celiac arteriogram was performed. With the use of a fathom 14 microwire, a regular Renegade microcatheter was advanced to the level of the gastroduodenal artery and a selective gastroduodenal arteriogram was performed. Next, the gastroduodenal artery was percutaneously coil embolized with multiple overlapping 5 mm, 6 mm, 8 mm and 10 mm diameter interlock coils to near the vessel's origin. Multiple selective arteriograms were performed during coil embolization. The microcatheter was retracted to the level of the common hepatic artery and a post embolization selective common hepatic arteriogram was performed. The microcatheter was removed and a completion celiac arteriogram was performed through the [facility] catheter. Images were reviewed and the procedure was terminated. All wires, catheters and sheaths were removed from the patient. Hemostasis was achieved at the right groin access site with deployment of an ExoSeal closure device and manual compression. A dressing was applied. The patient tolerated the procedure well without immediate post procedural complication. FINDINGS: Selective celiac arteriogram redemonstrates a replaced left hepatic artery arising from the left gastric artery. 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 Selective gastroduodenal arteriogram demonstrates an expected branching pattern giving rise to several pancreaticoduodenal arteries supplying the duodenal bulb descending portion of the duodenum. The GDA terminates in a hypertrophied right gastroepiploic  artery. While a discrete area contrast extravasation was not identified, the GDA was prophylactically  percutaneously coil embolized to near its origin given the endoscopic confirmation of the duodenal ulcer and definitive contrast extravasation demonstrated on preceding CTA. Completion selective common hepatic arteriogram and celiac arteriogram demonstrates a technically excellent results with occlusion of the GDA. IMPRESSION: 1. Technically successful prophylactic percutaneous coil embolization of the GDA secondary to bleeding duodenal ulcer demonstrated on preceding endoscopy and CTA. 2. 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 PLAN: - The patient is to remain flat for 4 hours with right leg straight. - As the patient was bleeding at the time of preceding CTA, he will likely experience several bloody/melanotic bowel movements and may require additional resuscitation, however ultimately I am hopeful he will stabilize in the coming days. - Repeat endoscopy may be performed at the discretion of the GI service as indicated. Electronically Signed   By: Sandi Mariscal M.D.   On: 06/08/2021 08:04   IR US Guide Vasc Access Right  Result Date: 06/08/2021 INDICATION: Upper GI bleeding secondary to duodenal ulcer. Please perform mesenteric arteriogram and percutaneous embolization as indicated. EXAM: 1. ULTRASOUND GUIDANCE FOR ARTERIAL ACCESS 2. SELECTIVE CELIAC ARTERIOGRAM 3. SELECTIVE COMMON HEPATIC ARTERIOGRAM 4. SELECTIVE GASTRODUODENAL ARTERIOGRAM AND PERCUTANEOUS COIL EMBOLIZATION COMPARISON:  CTA abdomen and pelvis-earlier same day MEDICATIONS: None ANESTHESIA/SEDATION: Moderate (conscious) sedation was employed during this procedure. A total of Versed 1 mg and Fentanyl 50 mcg was administered intravenously. Moderate Sedation Time: 59 minutes. The patient's level of consciousness and vital signs were monitored continuously by radiology nursing throughout the procedure under my direct supervision.  CONTRAST:  100 cc Omnipaque 300 FLUOROSCOPY TIME:  19 minutes, 18 seconds (378 mGy) COMPLICATIONS: None immediate. PROCEDURE: Informed consent was obtained from the patient following explanation of the procedure, risks, benefits and alternatives. All questions were addressed. A time out was performed prior to the initiation of the procedure. Maximal barrier sterile technique utilized including caps, mask, sterile gowns, sterile gloves, large sterile drape, hand hygiene, and Betadine prep. The right femoral head was marked fluoroscopically. Under sterile conditions and local anesthesia, the right common femoral artery access was performed with a micropuncture needle. Under direct ultrasound guidance, the right common femoral was accessed with a micropuncture kit. An ultrasound image was saved for documentation purposes. This allowed for placement of a 5-French vascular sheath. A limited arteriogram was performed through the side arm of the sheath confirming appropriate access within the right common femoral artery. Over a Bentson wire, a Mickelson catheter was advanced the caudal aspect of the thoracic aorta where was reformed, back bled and flushed. The Mickelson catheter was then utilized to select the celiac artery and a selective celiac arteriogram was performed. With the use of a fathom 14 microwire, a regular Renegade microcatheter was advanced to the level of the gastroduodenal artery and a selective gastroduodenal arteriogram was performed. Next, the gastroduodenal artery was percutaneously coil embolized with multiple overlapping 5 mm, 6 mm, 8 mm and 10 mm diameter interlock coils to near the vessel's origin. Multiple selective arteriograms were performed during coil embolization. The microcatheter was retracted to the level of the common hepatic artery and a post embolization selective common hepatic arteriogram was performed. The microcatheter was removed and a completion celiac arteriogram was performed  through the [facility] catheter. Images were reviewed and the procedure was terminated. All wires, catheters and sheaths were removed from  the patient. Hemostasis was achieved at the right groin access site with deployment of an ExoSeal closure device and manual compression. A dressing was applied. The patient tolerated the procedure well without immediate post procedural complication. FINDINGS: Selective celiac arteriogram redemonstrates a replaced left hepatic artery arising from the left gastric artery. 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 Selective gastroduodenal arteriogram demonstrates an expected branching pattern giving rise to several pancreaticoduodenal arteries supplying the duodenal bulb descending portion of the duodenum. The GDA terminates in a hypertrophied right gastroepiploic artery. While a discrete area contrast extravasation was not identified, the GDA was prophylactically percutaneously coil embolized to near its origin given the endoscopic confirmation of the duodenal ulcer and definitive contrast extravasation demonstrated on preceding CTA. Completion selective common hepatic arteriogram and celiac arteriogram demonstrates a technically excellent results with occlusion of the GDA. IMPRESSION: 1. Technically successful prophylactic percutaneous coil embolization of the GDA secondary to bleeding duodenal ulcer demonstrated on preceding endoscopy and CTA. 2. 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 PLAN: - The patient is to remain flat for 4 hours with right leg straight. - As the patient was bleeding at the time of preceding CTA, he will likely experience several bloody/melanotic bowel movements and may require additional resuscitation, however ultimately I am hopeful he will stabilize in  the coming days. - Repeat endoscopy may be performed at the discretion of the GI service as indicated. Electronically Signed   By: Sandi Mariscal M.D.   On: 06/08/2021 08:04   IR EMBO ART  VEN HEMORR LYMPH EXTRAV  INC GUIDE ROADMAPPING  Result Date: 06/08/2021 INDICATION: Upper GI bleeding secondary to duodenal ulcer. Please perform mesenteric arteriogram and percutaneous embolization as indicated. EXAM: 1. ULTRASOUND GUIDANCE FOR ARTERIAL ACCESS 2. SELECTIVE CELIAC ARTERIOGRAM 3. SELECTIVE COMMON HEPATIC ARTERIOGRAM 4. SELECTIVE GASTRODUODENAL ARTERIOGRAM AND PERCUTANEOUS COIL EMBOLIZATION COMPARISON:  CTA abdomen and pelvis-earlier same day MEDICATIONS: None ANESTHESIA/SEDATION: Moderate (conscious) sedation was employed during this procedure. A total of Versed 1 mg and Fentanyl 50 mcg was administered intravenously. Moderate Sedation Time: 59 minutes. The patient's level of consciousness and vital signs were monitored continuously by radiology nursing throughout the procedure under my direct supervision. CONTRAST:  100 cc Omnipaque 300 FLUOROSCOPY TIME:  19 minutes, 18 seconds (741 mGy) COMPLICATIONS: None immediate. PROCEDURE: Informed consent was obtained from the patient following explanation of the procedure, risks, benefits and alternatives. All questions were addressed. A time out was performed prior to the initiation of the procedure. Maximal barrier sterile technique utilized including caps, mask, sterile gowns, sterile gloves, large sterile drape, hand hygiene, and Betadine prep. The right femoral head was marked fluoroscopically. Under sterile conditions and local anesthesia, the right common femoral artery access was performed with a micropuncture needle. Under direct ultrasound guidance, the right common femoral was accessed with a micropuncture kit. An ultrasound image was saved for documentation purposes. This allowed for placement of a 5-French vascular sheath. A limited arteriogram was performed  through the side arm of the sheath confirming appropriate access within the right common femoral artery. Over a Bentson wire, a Mickelson catheter was advanced the caudal aspect of the thoracic aorta where was reformed, back bled and flushed. The Mickelson catheter was then utilized to select the celiac artery and a selective celiac arteriogram was performed. With the use of a fathom 14 microwire, a  regular Renegade microcatheter was advanced to the level of the gastroduodenal artery and a selective gastroduodenal arteriogram was performed. Next, the gastroduodenal artery was percutaneously coil embolized with multiple overlapping 5 mm, 6 mm, 8 mm and 10 mm diameter interlock coils to near the vessel's origin. Multiple selective arteriograms were performed during coil embolization. The microcatheter was retracted to the level of the common hepatic artery and a post embolization selective common hepatic arteriogram was performed. The microcatheter was removed and a completion celiac arteriogram was performed through the [facility] catheter. Images were reviewed and the procedure was terminated. All wires, catheters and sheaths were removed from the patient. Hemostasis was achieved at the right groin access site with deployment of an ExoSeal closure device and manual compression. A dressing was applied. The patient tolerated the procedure well without immediate post procedural complication. FINDINGS: Selective celiac arteriogram redemonstrates a replaced left hepatic artery arising from the left gastric artery. 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 Selective gastroduodenal arteriogram demonstrates an expected branching pattern giving rise to several pancreaticoduodenal arteries supplying the duodenal bulb descending portion of the duodenum. The GDA terminates in a hypertrophied right gastroepiploic artery. While a discrete area  contrast extravasation was not identified, the GDA was prophylactically percutaneously coil embolized to near its origin given the endoscopic confirmation of the duodenal ulcer and definitive contrast extravasation demonstrated on preceding CTA. Completion selective common hepatic arteriogram and celiac arteriogram demonstrates a technically excellent results with occlusion of the GDA. IMPRESSION: 1. Technically successful prophylactic percutaneous coil embolization of the GDA secondary to bleeding duodenal ulcer demonstrated on preceding endoscopy and CTA. 2. 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 PLAN: - The patient is to remain flat for 4 hours with right leg straight. - As the patient was bleeding at the time of preceding CTA, he will likely experience several bloody/melanotic bowel movements and may require additional resuscitation, however ultimately I am hopeful he will stabilize in the coming days. - Repeat endoscopy may be performed at the discretion of the GI service as indicated. Electronically Signed   By: Sandi Mariscal M.D.   On: 06/08/2021 08:04   CT Angio Abd/Pel w/ and/or w/o  Result Date: 06/07/2021 CLINICAL DATA:  GI bleeding. EXAM: CTA ABDOMEN AND PELVIS WITHOUT AND WITH CONTRAST TECHNIQUE: Multidetector CT imaging of the abdomen and pelvis was performed using the standard protocol during bolus administration of intravenous contrast. Multiplanar reconstructed images and MIPs were obtained and reviewed to evaluate the vascular anatomy. CONTRAST:  134m OMNIPAQUE IOHEXOL 350 MG/ML SOLN COMPARISON:  CT the chest, abdomen pelvis-10/10/2019 FINDINGS: VASCULAR Aorta: Scattered moderate amount of predominantly calcified atherosclerotic plaque within a mildly tortuous but normal caliber abdominal aorta, not resulting in a hemodynamically significant stenosis. No evidence of abdominal  aortic dissection or periaortic stranding. Celiac: Widely patent without hemodynamically significant narrowing. There is an approximately 0.7 x 0.6 cm contained penetrating atherosclerotic ulcer involving the proximal aspect of the common hepatic artery (coronal image 42, series 9), with associated non flow limiting intimal web. The left gastric artery is noted to give rise to a replaced left hepatic artery. The exact etiology of the gastric antral and duodenal bleeding is not definitely identified though presumably secondary to a small pancreaticoduodenal branch arising from the GDA. SMA: There is a minimal amount of calcified atherosclerotic plaque involving the origin of  this SMA, not resulting in hemodynamically significant stenosis. Conventional branching pattern. The distal tributaries the SMA appear widely patent without discrete intraluminal filling defect to suggest distal embolism. Renals: There is a tiny accessory right renal artery which supplies the inferior pole of the right kidney. Solitary left renal artery. There is a minimal amount of mixed calcified and noncalcified atherosclerotic plaque involving the origin of left renal artery, not resulting in a hemodynamically significant stenosis. No vessel irregularity to suggest FMD. IMA: Remains patent. Inflow: There is a minimal amount of eccentric predominantly calcified atherosclerotic plaque involving the bilateral common iliac arteries, not resulting in hemodynamically significant stenosis. The bilateral internal iliac arteries are disease though patent and of normal caliber. The bilateral external iliac arteries are of normal caliber and widely patent without a hemodynamically significant narrowing. Proximal Outflow: There is a moderate amount of eccentric calcified and noncalcified atherosclerotic plaque involving the bilateral common femoral arteries, not resulting in a hemodynamically significant stenosis. The imaged portions of the bilateral  deep and superficial femoral arteries are normal caliber and widely patent without hemodynamically significant narrowing. Veins: The IVC and pelvic venous systems appear widely patent. Review of the MIP images confirms the above findings. _________________________________________________________ NON-VASCULAR Lower chest: Limited visualization of the lower thorax demonstrates trace bilateral pleural effusions with associated bibasilar consolidative opacities. Normal heart size.  No pericardial effusion. Hepatobiliary: Normal hepatic contour. Redemonstrated punctate cysts within the dome of the liver, similar to the 09/2019 examination. Additional subcentimeter hypoattenuating hepatic lesions are too small to adequately characterize though favored to represent additional hepatic cysts. No discrete worrisome hepatic lesions. Normal appearance of the gallbladder given degree distention. No radiopaque gallstones. No intra or extrahepatic biliary duct dilatation. No ascites. Pancreas: Normal appearance of the pancreas. Spleen: Normal appearance of the spleen. Adrenals/Urinary Tract: Nonobstructing renal stones are seen bilaterally right significantly greater than left including a dominant nonobstructing layering stone within the posteroinferior aspect of the right kidney measuring 1.8 x 0.9 cm (image 35, series 6, morphologically similar to the 09/2019 examination. Bilateral subcentimeter hypoattenuating renal lesions are too small to adequately characterize though favored to represent renal cysts. No urinary obstruction or perinephric stranding. Normal appearance the bilateral adrenal glands. There is minimal mass effect of the prostate gland upon the undersurface of the urinary bladder. Otherwise, normal appearance of the urinary bladder given degree of distention. Stomach/Bowel: There is discrete intraluminal contrast extravasation at the level of the gastric antrum, duodenal bulb and descending portion of the duodenum  (axial images 43 through 57, series 7; coronal images 36 through 48, series 9), likely compatible with provided history of duodenal ulcer and the etiology of reported history of GI bleeding. Circumferential bowel wall thickening involving several loops of mid small bowel within the left mid hemiabdomen (representative images 23 through 30, series 10), not resulting in enteric obstruction, likely reactive in etiology given upstream GI bleeding. Scattered colonic diverticulosis without evidence of superimposed acute diverticulitis. No significant hiatal hernia. No pneumoperitoneum, pneumatosis or portal venous gas. Lymphatic: No bulky retroperitoneal, mesenteric, pelvic or inguinal lymphadenopathy. Reproductive: Dystrophic calcifications within a mildly enlarged prostate gland which has minimal mass effect on the undersurface of the urinary bladder. Other: Mild diffuse body wall anasarca, most conspicuous about the lateral aspects of the proximal thighs bilaterally. Musculoskeletal: No acute or aggressive osseous abnormalities. Mild-to-moderate multilevel lumbar spine DDD, worse at L4-L5 and L5-S1 with disc space height loss, endplate irregularity and sclerosis. IMPRESSION: 1. Examination is positive for acute intraluminal contrast at the level  of the gastric antrum and duodenal bulb, presumably secondary to reported history of duodenal ulcer. The exact etiology of the gastric antral and duodenal bleeding is not definitely identified though presumably secondary to a small pancreaticoduodenal branch arising from the GDA. 2. Scattered atherosclerotic plaque within normal caliber abdominal aorta, not resulting in a hemodynamically significant stenosis. Aortic Atherosclerosis (ICD10-I70.0). 3. 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 4. Non conventional mesenteric anatomy as above. Critical  Value/emergent results were called by telephone at the time of interpretation on 06/07/2021 at 4:17 pm to provider Salley Slaughter , who verbally acknowledged these results. Electronically Signed   By: Sandi Mariscal M.D.   On: 06/07/2021 16:26     LOS: 2 days   Antonieta Pert, MD [facility]  06/08/2021, 11:54 AM

## 2021-06-08 NOTE — Progress Notes (Signed)
Date and time results received: 06/08/21 22:32  Test: ABG   Critical Value:  pCO2  81.4  Name of Provider Notified: Marlowe Sax, MD  Orders Received? Or Actions Taken?: pt currently on CPAP, will continue to monitor

## 2021-06-08 NOTE — Consult Note (Signed)
NAME:  Martin Mendez, MRN:  [id number], DOB:  04-13-1946, LOS: 2 ADMISSION DATE:  06/05/2021, CONSULTATION DATE:  06/08/2021 REFERRING MD:  Maren Beach, CHIEF COMPLAINT:  Hypercarbia / Respiratory Acidosis   History of Present Illness:  75 year old male with PTSD, tobacco use, history of syncope with unclear cause, presented with hematemesis presyncope and melena and had drop in hemoglobin from 15 to 12 g, positive fecal test, 1 unit PRBC transfused, GI was consulted and patient was admitted.  Patient underwent EGD 7/23 that showed gastritis, duodenitis, non bleeding duodenal ulcer. Hospital course complicated by acute hypoxic and hypercapnic respiratory failure ABG with pH 7.275 PCO2 82.9 chest x-ray hyperinflated lungs but patient fairly alert awake oriented not in distress BiPAP had been ordered for the night, but patient was unable to tolerate it due to his PTSD and anxiety. CO2 per Chemistry is 37-38. PCCM have been asked to evaluate for suspected undiagnosed COPD and hypercarbic respiratory failure.   Pertinent  Medical History   Past Medical History:  Diagnosis Date   PTSD (post-traumatic stress disorder)      Significant Hospital Events: Including procedures, antibiotic start and stop dates in addition to other pertinent events   Admission 7/24 EGD 7/23  Interim History / Subjective:  Awake and alert, oriented to self place and date. He is lethargic, sats are 95%. He states he has been unable to wear either CPAP of BiPAP due to his PTSD and anxiety.  Net + 127 T max 98.5, WBC 9.6, HGB 11.3, Platelets 199  Objective   Blood pressure 113/67, pulse 97, temperature 98.5 F (36.9 C), temperature source Oral, resp. rate 18, height 5\' 10"  (1.778 m), weight 72.6 kg, SpO2 96 %.        Intake/Output Summary (Last 24 hours) at 06/08/2021 1138 Last data filed at 06/08/2021 0033 Gross per 24 hour  Intake 240 ml  Output 650 ml  Net -410 ml   Filed Weights   06/05/21 0251 06/05/21 1316   Weight: 72.6 kg 72.6 kg    Examination: General: Awake and alert, in bed in NAD HENT: NCAT, No LAD, No JVD, MMM Lungs: Bilateral chest excursion , Clear throughout, long exp. Phase, diminished per bases Cardiovascular: S1. S2, RRR, No RMG Abdomen: Soft, flat, NT, ND, BS + Extremities: Warm and dry, brisk refill, no edema, no obvious deformities Neuro: Alert and Oriented x 3, MAE x 4, Following commands GU: Not assessed  Resolved Hospital Problem list     Assessment & Plan:  Acute Hypercarbic respiratory failure 2/2 suspected undiagnosed COPD/ OSA with suspected chronic CO2 retention. Unable to tolerate CPAP/ BiPAP due to PTSD and anxiety PH 7.27, CO2 of 82 CXR with hyperinflated lungs Plan CPAP with nasal mask QHS and prn ( Auto Set 5-15 cm H2O) If CPAP is not effective, will need to try BiPAP Titrate oxygen for sats > 88% Continue Duo Nebs Will add prn albuterol for wheezing Will need formal evaluation for COPD with PFT's as OP. Will need OP sleep study  to evaluate for  OSA.  As OP may be able to use Nasal pillows Minimize sedating medications Continue Celexa to treat PTSD ABG  at 10 pm, and prn AMS or lethargy VBG in am  Best Practice (right click and "Reselect all SmartList Selections" daily)   Diet/type: Per Primary Team DVT prophylaxis: other GI prophylaxis: N/A Lines: N/A Foley:  N/A Code Status:  full code Last date of multidisciplinary goals of care discussion: Spoke with patient about need  for positive pressure ventilation and if it is unsuccessful and he remains symptomatic, he may  need intubation and mechanical ventilation if he becomes symptomatic. ]  Labs   CBC: Recent Labs  Lab 06/05/21 0321 06/05/21 1529 06/06/21 0810 06/07/21 0323 06/08/21 0101  WBC 10.5  --  8.4 8.9 9.6  NEUTROABS 7.5  --   --   --   --   HGB 12.9* 13.8 12.1* 12.1* 11.3*  HCT 40.5 43.3 39.2 37.7* 35.1*  MCV 96.9  --  98.0 95.2 93.6  PLT 240  --  193 194 199    Basic  Metabolic Panel: Recent Labs  Lab 06/05/21 0321 06/07/21 0323 06/08/21 0101  NA 139 135 137  K 4.0 4.0 3.7  CL 103 95* 95*  CO2 31 38* 37*  GLUCOSE 90 101* 100*  BUN 22 19 25*  CREATININE 0.77 0.64 0.62  CALCIUM 7.9* 8.2* 8.3*  MG  --  1.6*  --   PHOS  --  2.0*  --    GFR: Estimated Creatinine Clearance: 80.7 mL/min (by C-G formula based on SCr of 0.62 mg/dL). Recent Labs  Lab 06/05/21 0321 06/06/21 0810 06/07/21 0323 06/08/21 0101  WBC 10.5 8.4 8.9 9.6    Liver Function Tests: Recent Labs  Lab 06/05/21 0321  AST 15  ALT 10  ALKPHOS 41  BILITOT 0.5  PROT 4.8*  ALBUMIN 2.7*   No results for input(s): LIPASE, AMYLASE in the last 168 hours. No results for input(s): AMMONIA in the last 168 hours.  ABG    Component Value Date/Time   PHART 7.275 (L) 06/08/2021 0509   PCO2ART 82.9 (HH) 06/08/2021 0509   PO2ART 85.4 06/08/2021 0509   HCO3 37.3 (H) 06/08/2021 0509   O2SAT 96.0 06/08/2021 0509     Coagulation Profile: Recent Labs  Lab 06/07/21 1633  INR 1.1    Cardiac Enzymes: No results for input(s): CKTOTAL, CKMB, CKMBINDEX, TROPONINI in the last 168 hours.  HbA1C: No results found for: HGBA1C  CBG: No results for input(s): GLUCAP in the last 168 hours.  Review of Systems:   + for fatigue, some shortness of breath  Past Medical History:  He,  has a past medical history of PTSD (post-traumatic stress disorder).   Surgical History:   Past Surgical History:  Procedure Laterality Date   ESOPHAGOGASTRODUODENOSCOPY (EGD) WITH PROPOFOL N/A 06/05/2021   Procedure: ESOPHAGOGASTRODUODENOSCOPY (EGD) WITH PROPOFOL;  Surgeon: [name], MD;  Location: [facility] ENDOSCOPY;  Service: Endoscopy;  Laterality: N/A;   IR ANGIOGRAM FOLLOW UP STUDY  06/07/2021   IR ANGIOGRAM SELECTIVE EACH ADDITIONAL VESSEL  06/07/2021   IR ANGIOGRAM VISCERAL SELECTIVE  06/07/2021   IR EMBO ART  VEN HEMORR LYMPH EXTRAV  INC GUIDE ROADMAPPING  06/07/2021   IR US GUIDE VASC ACCESS RIGHT   06/07/2021     Social History:   reports that he has been smoking cigarettes. He has been smoking an average of .5 packs per day. He has never used smokeless tobacco. He reports that he does not drink alcohol and does not use drugs.   Family History:  His family history is not on file.   Allergies Allergies  Allergen Reactions   Penicillins Swelling    Did it involve swelling of the face/tongue/throat, SOB, or low BP? No Did it involve sudden or severe rash/hives, skin peeling, or any reaction on the inside of your mouth or nose? No Did you need to seek medical attention at a hospital or doctor's  office? No When did it last happen?    50 years ago (age 46)   If all above answers are "NO", may proceed with cephalosporin use.  Swelling of arms and legs   Sulfa Antibiotics Rash    tongue     Home Medications  Prior to Admission medications   Medication Sig Start Date End Date Taking? Authorizing Provider  acetaminophen (TYLENOL) 500 MG tablet Take 500 mg by mouth daily.   Yes [provider]  citalopram (CELEXA) 20 MG tablet TAKE 1 TABLET BY MOUTH ONCE DAILY IN THE MORNING Patient taking differently: Take 20 mg by mouth daily. 09/18/20  Yes Donnal Moat T, PA-C  loratadine (CLARITIN) 10 MG tablet Take 10 mg by mouth daily.   Yes [provider]  propranolol (INDERAL) 10 MG tablet TAKE 1 TABLET BY MOUTH ONCE DAILY AS NEEDED Patient taking differently: Take 10 mg by mouth daily as needed (anxiety). 12/23/19  Yes Addison Lank, PA-C   I have discussed with patient that he needs to attempt wearing the CPAP and if this fails the BiPAP as an attempt to avoid intubation/ ventilation . He states he is willing to try the CPAP with the small nasal mask. We will follow with an ABG to ensure therapy is effective.   Critical care time: 45 minutes    Magdalen Spatz, MSN, AGACNP-BC [place] for personal pager PCCM on call pager [phone]  06/08/2021 3:30 PM

## 2021-06-08 NOTE — Progress Notes (Addendum)
[facility] Gastroenterology Progress Note  [name] 75 y.o. [birth year]   Subjective: Feels ok. Denies abdominal pain/N/V. Black stool overnight. Tolerating clear liquids.  Objective: Vital signs: Vitals:   06/07/21 2222 06/08/21 0453  BP: 120/74 113/67  Pulse: 87 97  Resp:  18  Temp:  98.5 F (36.9 C)  SpO2: 99% 96%    Physical Exam: Gen: lethargic, elderly, thin, no acute distress, pleasant HEENT: anicteric sclera CV: RRR Chest: CTA B Abd: epigastric tenderness with minimal guarding, soft, nondistended, +BS Ext: no edema  Lab Results: Recent Labs    06/07/21 0323 06/08/21 0101  NA 135 137  K 4.0 3.7  CL 95* 95*  CO2 38* 37*  GLUCOSE 101* 100*  BUN 19 25*  CREATININE 0.64 0.62  CALCIUM 8.2* 8.3*  MG 1.6*  --   PHOS 2.0*  --    No results for input(s): AST, ALT, ALKPHOS, BILITOT, PROT, ALBUMIN in the last 72 hours. Recent Labs    06/07/21 0323 06/08/21 0101  WBC 8.9 9.6  HGB 12.1* 11.3*  HCT 37.7* 35.1*  MCV 95.2 93.6  PLT 194 199      Assessment/Plan: Duodenal ulcer bleed - s/p embolization to GDA. Hgb relatively stable. Black stools likely to persist for 1-2 days from recent bleed. H. Pylori serology positive and will start treatment when tolerating solid foods and will need to complete course as outpt. Change to Protonix 40 mg IV Q 12 hours today and then change to PO BID at discharge. F/U with Dr. Paulita Fujita in 6-8 weeks. Slowly advance diet. Will follow.   Martin Mendez 06/08/2021, 9:00 AM  Questions please call [phone] Patient ID: Martin Mendez, male   DOB: 1945/11/18, 75 y.o.   MRN: [id number]

## 2021-06-08 NOTE — Progress Notes (Signed)
Critical ABG results received from lab.  Dr. Marlowe Sax notified of pH 7.275 and pCO2 82.9.  Dr. Marlowe Sax requesting that RT be called to place Martin Mendez on CPAP.  However, he is unable to tolerate CPAP/BiPap because of his PTSD and anxiety.  RT states they will round on him after shift change.  Patient is in NAD.  He is on the phone ordering breakfast during bedside shift report.  Report given to Amor, Therapist, sports.  Jodell Cipro

## 2021-06-08 NOTE — Progress Notes (Signed)
Patient is on a dream station bipap requiring 3 lpm bled in to maintain O2 sats of 92. Patient is tolerating well at this time.

## 2021-06-09 ENCOUNTER — Telehealth: Payer: Self-pay | Admitting: Acute Care

## 2021-06-09 DIAGNOSIS — K922 Gastrointestinal hemorrhage, unspecified: Secondary | ICD-10-CM | POA: Diagnosis not present

## 2021-06-09 LAB — TYPE AND SCREEN
ABO/RH(D): O POS
Antibody Screen: NEGATIVE
Unit division: 0
Unit division: 0

## 2021-06-09 LAB — BPAM RBC
Blood Product Expiration Date: 202208202359
Blood Product Expiration Date: 202208242359
ISSUE DATE / TIME: 202207231036
Unit Type and Rh: 5100
Unit Type and Rh: 5100

## 2021-06-09 LAB — HEMOGLOBIN AND HEMATOCRIT, BLOOD
HCT: 30.2 % — ABNORMAL LOW (ref 39.0–52.0)
Hemoglobin: 9.9 g/dL — ABNORMAL LOW (ref 13.0–17.0)

## 2021-06-09 MED ORDER — IPRATROPIUM-ALBUTEROL 0.5-2.5 (3) MG/3ML IN SOLN: 3.0000 mL | Freq: Two times a day (BID) | RESPIRATORY_TRACT | Status: DC

## 2021-06-09 MED ORDER — PHENOL 1.4 % MT LIQD
1.0000 | OROMUCOSAL | Status: DC | PRN
  Administered 2021-06-09 – 2021-06-10 (×4): 1 via OROMUCOSAL
  Filled 2021-06-09: qty 177

## 2021-06-09 MED ORDER — PANTOPRAZOLE SODIUM 40 MG PO TBEC
40.0000 mg | DELAYED_RELEASE_TABLET | Freq: Two times a day (BID) | ORAL | Status: DC
  Administered 2021-06-09 – 2021-06-12 (×7): 40 mg via ORAL
  Filled 2021-06-09 (×7): qty 1

## 2021-06-09 MED ORDER — UMECLIDINIUM BROMIDE 62.5 MCG/INH IN AEPB
1.0000 | INHALATION_SPRAY | Freq: Every day | RESPIRATORY_TRACT | Status: DC
  Administered 2021-06-10 – 2021-06-12 (×2): 1 via RESPIRATORY_TRACT
  Filled 2021-06-09: qty 7

## 2021-06-09 MED ORDER — UMECLIDINIUM BROMIDE 62.5 MCG/INH IN AEPB
1.0000 | INHALATION_SPRAY | Freq: Every day | RESPIRATORY_TRACT | Status: DC
  Filled 2021-06-09: qty 7

## 2021-06-09 NOTE — Telephone Encounter (Signed)
Appointment scheduled.

## 2021-06-09 NOTE — Care Management (Addendum)
06-09-21 1506 Narrative:  Martin Mendez continues to exhibit signs of acute on chronic hypoxic/hypercapnic respiratory failure from COPD.  The use of the NIV will treat patient's high PC02 levels (81.4 on 06/08/21 with elevated bicarbonate of 41.4) and can reduce risk of exacerbations and future hospitalizations when used at night and during the day.  All alternate devices [phone] and [id number]) have been proven ineffective to provide essential volume control necessary to maintain acceptable CO2 levels. An NIV with AVAPS AE is necessary to prevent patient harm.  Interruption or failure to provide NIV would quickly lead to exacerbation of the patient's condition, hospital admission, and likely harm to the patient. Continued use is preferred.  Patient is able to maintain airway and clear secretions.

## 2021-06-09 NOTE — Progress Notes (Signed)
Pt was able to tolerate CPAP on auto settings using FFM for 5h & 30 mins after PRN dose of propranolol.

## 2021-06-09 NOTE — Progress Notes (Signed)
[facility] Gastroenterology Progress Note  [name] 75 y.o. 1946-07-27   Subjective: Feels ok. Receiving a breathing treatment. Tolerating soft diet. No BMs overnight.  Objective: Vital signs: Vitals:   06/09/21 0459 06/09/21 0921  BP:    Pulse:    Resp:    Temp:    SpO2: 96% 96%    Physical Exam: Gen: lethargic, elderly, no acute distress, thin HEENT: anicteric sclera CV: RRR Chest: CTA B Abd: soft, nontender, nondistended, +BS Ext: no edema  Lab Results: Recent Labs    06/07/21 0323 06/08/21 0101  NA 135 137  K 4.0 3.7  CL 95* 95*  CO2 38* 37*  GLUCOSE 101* 100*  BUN 19 25*  CREATININE 0.64 0.62  CALCIUM 8.2* 8.3*  MG 1.6*  --   PHOS 2.0*  --    No results for input(s): AST, ALT, ALKPHOS, BILITOT, PROT, ALBUMIN in the last 72 hours. Recent Labs    06/07/21 0323 06/08/21 0101  WBC 8.9 9.6  HGB 12.1* 11.3*  HCT 37.7* 35.1*  MCV 95.2 93.6  PLT 194 199      Assessment/Plan: Duodenal ulcer bleed - H. Pylori positive. H/H pending. Start H. Pylori treatment at discharge to complete 14 day course. Changed PPI to PO BID. F/U with Dr. Paulita Fujita in 6-8 weeks. Will sign off. Call if questions.   Martin Mendez 06/09/2021, 9:33 AM  Questions please call [phone] Patient ID: [name], male   DOB: 05/12/1946, 75 y.o.   MRN: [id number]

## 2021-06-09 NOTE — Progress Notes (Signed)
PROGRESS NOTE    Martin Mendez  NWG:[id number] DOB: 05-04-1946 DOA: 06/05/2021 PCP: Lavone Orn, MD   Chief Complaint  Patient presents with   Emesis   Hypotension    Brief Narrative: 76 year old male with PTSD, tobacco use, history of syncope with unclear cause, presented with hematemesis presyncope and melena and had drop in hemoglobin from 15 to 12 g, positive fecal test, 1 unit PRBC transfused, GI was consulted and patient was admitted.  Patient underwent EGD 7/23 that showed gastritis, duodenitis, nonbleeding duodenal ulcer. Hospital course complicated by acute hypoxic and hypercapnic respiratory failure ABG with pH 7.35 PCO2 80 chest x-ray hyperinflated lungs but patient fairly alert awake oriented not in distress BiPAP has been ordered for the night. Patient had 2 episodes of bright red blood per rectum GI was notified and CT angio was positive subsequently underwent mesenteric arteriogram and percutaneous coil embolization of GDA 7/25  Subjective: Seen this morning no recurrent bleeding.  Able to tolerate CPAP for at least 6 hours or more  Assessment & Plan:  Acute GI bleeding: underwent EGD 7/23 that showed gastritis, duodenitis, nonbleeding duodenal ulcer. had 2 episodes of bright red blood per rectum GI was notified and CT angio was positive subsequently underwent mesenteric arteriogram and percutaneous coil embolization of GDA 7/25.  Slightly downtrending hemoglobin keep on PPI twice daily GI recommending H. pylori treatment x14 days upon discharge.  Tolerating soft diet.  Acute blood loss anemia: 2/2 #1.status post 1 unit PRBC.  Hemoglobin overall stable although slightly downtrending repeat H&H in the morning r Recent Labs  Lab 06/05/21 1529 06/06/21 0810 06/07/21 0323 06/08/21 0101 06/09/21 0853  HGB 13.8 12.1* 12.1* 11.3* 9.9*  HCT 43.3 39.2 37.7* 35.1* 30.2*   Acute hypoxic/hypercapnic respiratory failure likely from undiagnosed advanced COPD possible OSA suspect  chronic CO2 retention Suspecting undiagnosed COPD/OSA with suspected chronic CO2 retention.  Has longstanding smoking history imaging shows hyperinflated lung.  He was able to tolerate CPAP last night.  Await for further pulm recommendatIO for NIV upon discharge-we will consult TOC for NIV.  PTSD/anxiety mood is stable, cont Celexa Tobacco use disorder continue nicotine patch.  Cessation advised. Hypomagnesemia/Hypophosphatemia  on oral replacement  Diet Order             DIET SOFT Room service appropriate? Yes; Fluid consistency: Thin  Diet effective now                   Patient's Body mass index is 22.96 kg/m. DVT prophylaxis: SCDs Start: 06/05/21 0536 Code Status:   Code Status: Full Code  Family Communication: plan of care discussed with patient at bedside.  Status is: Inpatient Remains hospitalized as inpatient status Dispo:  Patient From: Home  Planned Disposition: Home tomororw based on NIV need.  Medically stable for discharge: No  Unresulted Labs (From admission, onward)    None       Medications reviewed:  Scheduled Meds:  citalopram  20 mg Oral q morning   ipratropium-albuterol  3 mL Nebulization BID   loratadine  10 mg Oral Daily   magnesium oxide  400 mg Oral BID   nicotine  14 mg Transdermal Daily   pantoprazole  40 mg Oral BID   pneumococcal 23 valent vaccine  0.5 mL Intramuscular [id number]   saline  1 application Each Nare U1L   Continuous Infusions: Consultants:see note  Procedures:see note Antimicrobials: Anti-infectives (From admission, onward)    None      Culture/Microbiology  Component Value Date/Time   SDES URINE, RANDOM 10/10/2019 1614   SPECREQUEST NONE 10/10/2019 1614   CULT  10/10/2019 1614    NO GROWTH Performed at [facility], [place] [address] [place], [place] [address]    REPTSTATUS 10/11/2019 FINAL 10/10/2019 1614    Other culture-see note  Objective: Vitals: Today's Vitals   06/09/21 0459 06/09/21  0813 06/09/21 0921 06/09/21 1229  BP:    (!) 98/55  Pulse:    81  Resp:    18  Temp:    98.2 F (36.8 C)  TempSrc:    Oral  SpO2: 96%  96% 99%  Weight:      Height:      PainSc:  0-No pain      Intake/Output Summary (Last 24 hours) at 06/09/2021 1319 Last data filed at 06/09/2021 0700 Gross per 24 hour  Intake 360 ml  Output 650 ml  Net -290 ml    Filed Weights   06/05/21 0251 06/05/21 1316  Weight: 72.6 kg 72.6 kg   Weight change:   Intake/Output from previous day: 07/26 0701 - 07/27 0700 In: 600 [P.O.:600] Out: 850 [Urine:850] Intake/Output this shift: No intake/output data recorded. Filed Weights   06/05/21 0251 06/05/21 1316  Weight: 72.6 kg 72.6 kg   Examination:  General exam:AAOx 3,elderly not in distress.  HEENT:Oral mucosa moist,Ear/Nose,WNL grossly, dentition normal. Respiratory system: bilaterally diminished no use of accessory muscle. Cardiovascular system: S1 & S2 +,No JVD. Gastrointestinal system: Abdomen soft,NT,ND,BS+. Nervous System:Alert, awake, moving extremities and grossly nonfocal Extremities: No edema, distal peripheral pulses palpable.  Skin:No rashes,no icterus. UEA:VWUJWJ muscle bulk,tone, power.   Data Reviewed: I have personally reviewed following labs and imaging studies CBC: Recent Labs  Lab 06/05/21 0321 06/05/21 1529 06/06/21 0810 06/07/21 0323 06/08/21 0101 06/09/21 0853  WBC 10.5  --  8.4 8.9 9.6  --   NEUTROABS 7.5  --   --   --   --   --   HGB 12.9* 13.8 12.1* 12.1* 11.3* 9.9*  HCT 40.5 43.3 39.2 37.7* 35.1* 30.2*  MCV 96.9  --  98.0 95.2 93.6  --   PLT 240  --  193 194 199  --     Basic Metabolic Panel: Recent Labs  Lab 06/05/21 0321 06/07/21 0323 06/08/21 0101  NA 139 135 137  K 4.0 4.0 3.7  CL 103 95* 95*  CO2 31 38* 37*  GLUCOSE 90 101* 100*  BUN 22 19 25*  CREATININE 0.77 0.64 0.62  CALCIUM 7.9* 8.2* 8.3*  MG  --  1.6*  --   PHOS  --  2.0*  --     GFR: Estimated Creatinine Clearance: 80.7  mL/min (by C-G formula based on SCr of 0.62 mg/dL). Liver Function Tests: Recent Labs  Lab 06/05/21 0321  AST 15  ALT 10  ALKPHOS 41  BILITOT 0.5  PROT 4.8*  ALBUMIN 2.7*    No results for input(s): LIPASE, AMYLASE in the last 168 hours. No results for input(s): AMMONIA in the last 168 hours. Coagulation Profile: Recent Labs  Lab 06/07/21 1633  INR 1.1    Cardiac Enzymes: No results for input(s): CKTOTAL, CKMB, CKMBINDEX, TROPONINI in the last 168 hours. BNP (last 3 results) No results for input(s): PROBNP in the last 8760 hours. HbA1C: No results for input(s): HGBA1C in the last 72 hours. CBG: No results for input(s): GLUCAP in the last 168 hours. Lipid Profile: No results for input(s): CHOL, HDL, LDLCALC, TRIG,  CHOLHDL, LDLDIRECT in the last 72 hours. Thyroid Function Tests: No results for input(s): TSH, T4TOTAL, FREET4, T3FREE, THYROIDAB in the last 72 hours. Anemia Panel: No results for input(s): VITAMINB12, FOLATE, FERRITIN, TIBC, IRON, RETICCTPCT in the last 72 hours. Sepsis Labs: No results for input(s): PROCALCITON, LATICACIDVEN in the last 168 hours.  Recent Results (from the past 240 hour(s))  Resp Panel by RT-PCR (Flu A&B, Covid) Nasopharyngeal Swab     Status: None   Collection Time: 06/05/21  3:21 AM   Specimen: Nasopharyngeal Swab; Nasopharyngeal(NP) swabs in vial transport medium  Result Value Ref Range Status   SARS Coronavirus 2 by RT PCR NEGATIVE NEGATIVE Final    Comment: (NOTE) SARS-CoV-2 target nucleic acids are NOT DETECTED.  The SARS-CoV-2 RNA is generally detectable in upper respiratory specimens during the acute phase of infection. The lowest concentration of SARS-CoV-2 viral copies this assay can detect is 138 copies/mL. A negative result does not preclude SARS-Cov-2 infection and should not be used as the sole basis for treatment or other patient management decisions. A negative result may occur with  improper specimen  collection/handling, submission of specimen other than nasopharyngeal swab, presence of viral mutation(s) within the areas targeted by this assay, and inadequate number of viral copies(<138 copies/mL). A negative result must be combined with clinical observations, patient history, and epidemiological information. The expected result is Negative.  Fact Sheet for Patients:  EntrepreneurPulse.com.au  Fact Sheet for Healthcare Providers:  IncredibleEmployment.be  This test is no t yet approved or cleared by the [place] FDA and  has been authorized for detection and/or diagnosis of SARS-CoV-2 by FDA under an Emergency Use Authorization (EUA). This EUA will remain  in effect (meaning this test can be used) for the duration of the COVID-19 declaration under Section 564(b)(1) of the Act, 21 U.S.C.section 360bbb-3(b)(1), unless the authorization is terminated  or revoked sooner.       Influenza A by PCR NEGATIVE NEGATIVE Final   Influenza B by PCR NEGATIVE NEGATIVE Final    Comment: (NOTE) The Xpert Xpress SARS-CoV-2/FLU/RSV plus assay is intended as an aid in the diagnosis of influenza from Nasopharyngeal swab specimens and should not be used as a sole basis for treatment. Nasal washings and aspirates are unacceptable for Xpert Xpress SARS-CoV-2/FLU/RSV testing.  Fact Sheet for Patients: EntrepreneurPulse.com.au  Fact Sheet for Healthcare Providers: IncredibleEmployment.be  This test is not yet approved or cleared by the [place] FDA and has been authorized for detection and/or diagnosis of SARS-CoV-2 by FDA under an Emergency Use Authorization (EUA). This EUA will remain in effect (meaning this test can be used) for the duration of the COVID-19 declaration under Section 564(b)(1) of the Act, 21 U.S.C. section 360bbb-3(b)(1), unless the authorization is terminated or revoked.  Performed at [facility], [place] [address], [place], [place] [address]       Radiology Studies: IR Angiogram Visceral Selective  Result Date: 06/08/2021 INDICATION: Upper GI bleeding secondary to duodenal ulcer. Please perform mesenteric arteriogram and percutaneous embolization as indicated. EXAM: 1. ULTRASOUND GUIDANCE FOR ARTERIAL ACCESS 2. SELECTIVE CELIAC ARTERIOGRAM 3. SELECTIVE COMMON HEPATIC ARTERIOGRAM 4. SELECTIVE GASTRODUODENAL ARTERIOGRAM AND PERCUTANEOUS COIL EMBOLIZATION COMPARISON:  CTA abdomen and pelvis-earlier same day MEDICATIONS: None ANESTHESIA/SEDATION: Moderate (conscious) sedation was employed during this procedure. A total of Versed 1 mg and Fentanyl 50 mcg was administered intravenously. Moderate Sedation Time: 59 minutes. The patient's level of consciousness and vital signs were monitored continuously by radiology nursing throughout the procedure under  my direct supervision. CONTRAST:  100 cc Omnipaque 300 FLUOROSCOPY TIME:  19 minutes, 18 seconds (756 mGy) COMPLICATIONS: None immediate. PROCEDURE: Informed consent was obtained from the patient following explanation of the procedure, risks, benefits and alternatives. All questions were addressed. A time out was performed prior to the initiation of the procedure. Maximal barrier sterile technique utilized including caps, mask, sterile gowns, sterile gloves, large sterile drape, hand hygiene, and Betadine prep. The right femoral head was marked fluoroscopically. Under sterile conditions and local anesthesia, the right common femoral artery access was performed with a micropuncture needle. Under direct ultrasound guidance, the right common femoral was accessed with a micropuncture kit. An ultrasound image was saved for documentation purposes. This allowed for placement of a 5-French vascular sheath. A limited arteriogram was performed through the side arm of the sheath confirming appropriate access within the right common femoral artery. Over a  Bentson wire, a Mickelson catheter was advanced the caudal aspect of the thoracic aorta where was reformed, back bled and flushed. The Mickelson catheter was then utilized to select the celiac artery and a selective celiac arteriogram was performed. With the use of a fathom 14 microwire, a regular Renegade microcatheter was advanced to the level of the gastroduodenal artery and a selective gastroduodenal arteriogram was performed. Next, the gastroduodenal artery was percutaneously coil embolized with multiple overlapping 5 mm, 6 mm, 8 mm and 10 mm diameter interlock coils to near the vessel's origin. Multiple selective arteriograms were performed during coil embolization. The microcatheter was retracted to the level of the common hepatic artery and a post embolization selective common hepatic arteriogram was performed. The microcatheter was removed and a completion celiac arteriogram was performed through the [facility] catheter. Images were reviewed and the procedure was terminated. All wires, catheters and sheaths were removed from the patient. Hemostasis was achieved at the right groin access site with deployment of an ExoSeal closure device and manual compression. A dressing was applied. The patient tolerated the procedure well without immediate post procedural complication. FINDINGS: Selective celiac arteriogram redemonstrates a replaced left hepatic artery arising from the left gastric artery. 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 Selective gastroduodenal arteriogram demonstrates an expected branching pattern giving rise to several pancreaticoduodenal arteries supplying the duodenal bulb descending portion of the duodenum. The GDA terminates in a hypertrophied right gastroepiploic artery. While a discrete area contrast extravasation was not identified, the GDA was prophylactically percutaneously coil embolized to near  its origin given the endoscopic confirmation of the duodenal ulcer and definitive contrast extravasation demonstrated on preceding CTA. Completion selective common hepatic arteriogram and celiac arteriogram demonstrates a technically excellent results with occlusion of the GDA. IMPRESSION: 1. Technically successful prophylactic percutaneous coil embolization of the GDA secondary to bleeding duodenal ulcer demonstrated on preceding endoscopy and CTA. 2. 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 PLAN: - The patient is to remain flat for 4 hours with right leg straight. - As the patient was bleeding at the time of preceding CTA, he will likely experience several bloody/melanotic bowel movements and may require additional resuscitation, however ultimately I am hopeful he will stabilize in the coming days. - Repeat endoscopy may be performed at the discretion of the GI service as indicated. Electronically Signed   By: Sandi Mariscal M.D.   On: 06/08/2021 08:04   IR Angiogram Selective Each  Additional Vessel  Result Date: 06/08/2021 INDICATION: Upper GI bleeding secondary to duodenal ulcer. Please perform mesenteric arteriogram and percutaneous embolization as indicated. EXAM: 1. ULTRASOUND GUIDANCE FOR ARTERIAL ACCESS 2. SELECTIVE CELIAC ARTERIOGRAM 3. SELECTIVE COMMON HEPATIC ARTERIOGRAM 4. SELECTIVE GASTRODUODENAL ARTERIOGRAM AND PERCUTANEOUS COIL EMBOLIZATION COMPARISON:  CTA abdomen and pelvis-earlier same day MEDICATIONS: None ANESTHESIA/SEDATION: Moderate (conscious) sedation was employed during this procedure. A total of Versed 1 mg and Fentanyl 50 mcg was administered intravenously. Moderate Sedation Time: 59 minutes. The patient's level of consciousness and vital signs were monitored continuously by radiology nursing throughout the procedure under my direct supervision. CONTRAST:  100 cc  Omnipaque 300 FLUOROSCOPY TIME:  19 minutes, 18 seconds (031 mGy) COMPLICATIONS: None immediate. PROCEDURE: Informed consent was obtained from the patient following explanation of the procedure, risks, benefits and alternatives. All questions were addressed. A time out was performed prior to the initiation of the procedure. Maximal barrier sterile technique utilized including caps, mask, sterile gowns, sterile gloves, large sterile drape, hand hygiene, and Betadine prep. The right femoral head was marked fluoroscopically. Under sterile conditions and local anesthesia, the right common femoral artery access was performed with a micropuncture needle. Under direct ultrasound guidance, the right common femoral was accessed with a micropuncture kit. An ultrasound image was saved for documentation purposes. This allowed for placement of a 5-French vascular sheath. A limited arteriogram was performed through the side arm of the sheath confirming appropriate access within the right common femoral artery. Over a Bentson wire, a Mickelson catheter was advanced the caudal aspect of the thoracic aorta where was reformed, back bled and flushed. The Mickelson catheter was then utilized to select the celiac artery and a selective celiac arteriogram was performed. With the use of a fathom 14 microwire, a regular Renegade microcatheter was advanced to the level of the gastroduodenal artery and a selective gastroduodenal arteriogram was performed. Next, the gastroduodenal artery was percutaneously coil embolized with multiple overlapping 5 mm, 6 mm, 8 mm and 10 mm diameter interlock coils to near the vessel's origin. Multiple selective arteriograms were performed during coil embolization. The microcatheter was retracted to the level of the common hepatic artery and a post embolization selective common hepatic arteriogram was performed. The microcatheter was removed and a completion celiac arteriogram was performed through the  [facility] catheter. Images were reviewed and the procedure was terminated. All wires, catheters and sheaths were removed from the patient. Hemostasis was achieved at the right groin access site with deployment of an ExoSeal closure device and manual compression. A dressing was applied. The patient tolerated the procedure well without immediate post procedural complication. FINDINGS: Selective celiac arteriogram redemonstrates a replaced left hepatic artery arising from the left gastric artery. 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 Selective gastroduodenal arteriogram demonstrates an expected branching pattern giving rise to several pancreaticoduodenal arteries supplying the duodenal bulb descending portion of the duodenum. The GDA terminates in a hypertrophied right gastroepiploic artery. While a discrete area contrast extravasation was not identified, the GDA was prophylactically percutaneously coil embolized to near its origin given the endoscopic confirmation of the duodenal ulcer and definitive contrast extravasation demonstrated on preceding CTA. Completion selective common hepatic arteriogram and celiac arteriogram demonstrates a technically excellent results with occlusion of the GDA. IMPRESSION: 1. Technically successful prophylactic percutaneous coil embolization of the GDA secondary to bleeding duodenal ulcer demonstrated on preceding endoscopy and CTA. 2. Incidentally noted contained penetrating atherosclerotic ulcer  arising from the proximal aspect of the common hepatic artery as demonstrated on preceding CTA. PLAN: - The patient is to remain flat for 4 hours with right leg straight. - As the patient was bleeding at the time of preceding CTA, he will likely experience several bloody/melanotic bowel movements and may require additional resuscitation, however ultimately I am hopeful he will stabilize in the coming  days. - Repeat endoscopy may be performed at the discretion of the GI service as indicated. Electronically Signed   By: Sandi Mariscal M.D.   On: 06/08/2021 08:04   IR Angiogram Follow Up Study  Result Date: 06/08/2021 INDICATION: Upper GI bleeding secondary to duodenal ulcer. Please perform mesenteric arteriogram and percutaneous embolization as indicated. EXAM: 1. ULTRASOUND GUIDANCE FOR ARTERIAL ACCESS 2. SELECTIVE CELIAC ARTERIOGRAM 3. SELECTIVE COMMON HEPATIC ARTERIOGRAM 4. SELECTIVE GASTRODUODENAL ARTERIOGRAM AND PERCUTANEOUS COIL EMBOLIZATION COMPARISON:  CTA abdomen and pelvis-earlier same day MEDICATIONS: None ANESTHESIA/SEDATION: Moderate (conscious) sedation was employed during this procedure. A total of Versed 1 mg and Fentanyl 50 mcg was administered intravenously. Moderate Sedation Time: 59 minutes. The patient's level of consciousness and vital signs were monitored continuously by radiology nursing throughout the procedure under my direct supervision. CONTRAST:  100 cc Omnipaque 300 FLUOROSCOPY TIME:  19 minutes, 18 seconds (903 mGy) COMPLICATIONS: None immediate. PROCEDURE: Informed consent was obtained from the patient following explanation of the procedure, risks, benefits and alternatives. All questions were addressed. A time out was performed prior to the initiation of the procedure. Maximal barrier sterile technique utilized including caps, mask, sterile gowns, sterile gloves, large sterile drape, hand hygiene, and Betadine prep. The right femoral head was marked fluoroscopically. Under sterile conditions and local anesthesia, the right common femoral artery access was performed with a micropuncture needle. Under direct ultrasound guidance, the right common femoral was accessed with a micropuncture kit. An ultrasound image was saved for documentation purposes. This allowed for placement of a 5-French vascular sheath. A limited arteriogram was performed through the side arm of the sheath  confirming appropriate access within the right common femoral artery. Over a Bentson wire, a Mickelson catheter was advanced the caudal aspect of the thoracic aorta where was reformed, back bled and flushed. The Mickelson catheter was then utilized to select the celiac artery and a selective celiac arteriogram was performed. With the use of a fathom 14 microwire, a regular Renegade microcatheter was advanced to the level of the gastroduodenal artery and a selective gastroduodenal arteriogram was performed. Next, the gastroduodenal artery was percutaneously coil embolized with multiple overlapping 5 mm, 6 mm, 8 mm and 10 mm diameter interlock coils to near the vessel's origin. Multiple selective arteriograms were performed during coil embolization. The microcatheter was retracted to the level of the common hepatic artery and a post embolization selective common hepatic arteriogram was performed. The microcatheter was removed and a completion celiac arteriogram was performed through the [facility] catheter. Images were reviewed and the procedure was terminated. All wires, catheters and sheaths were removed from the patient. Hemostasis was achieved at the right groin access site with deployment of an ExoSeal closure device and manual compression. A dressing was applied. The patient tolerated the procedure well without immediate post procedural complication. FINDINGS: Selective celiac arteriogram redemonstrates a replaced left hepatic artery arising from the left gastric artery. 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 Selective gastroduodenal arteriogram demonstrates an expected branching pattern giving rise to several pancreaticoduodenal arteries  supplying the duodenal bulb descending portion of the duodenum. The GDA terminates in a hypertrophied right gastroepiploic artery. While a discrete area contrast extravasation was not  identified, the GDA was prophylactically percutaneously coil embolized to near its origin given the endoscopic confirmation of the duodenal ulcer and definitive contrast extravasation demonstrated on preceding CTA. Completion selective common hepatic arteriogram and celiac arteriogram demonstrates a technically excellent results with occlusion of the GDA. IMPRESSION: 1. Technically successful prophylactic percutaneous coil embolization of the GDA secondary to bleeding duodenal ulcer demonstrated on preceding endoscopy and CTA. 2. 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 PLAN: - The patient is to remain flat for 4 hours with right leg straight. - As the patient was bleeding at the time of preceding CTA, he will likely experience several bloody/melanotic bowel movements and may require additional resuscitation, however ultimately I am hopeful he will stabilize in the coming days. - Repeat endoscopy may be performed at the discretion of the GI service as indicated. Electronically Signed   By: Sandi Mariscal M.D.   On: 06/08/2021 08:04   IR US Guide Vasc Access Right  Result Date: 06/08/2021 INDICATION: Upper GI bleeding secondary to duodenal ulcer. Please perform mesenteric arteriogram and percutaneous embolization as indicated. EXAM: 1. ULTRASOUND GUIDANCE FOR ARTERIAL ACCESS 2. SELECTIVE CELIAC ARTERIOGRAM 3. SELECTIVE COMMON HEPATIC ARTERIOGRAM 4. SELECTIVE GASTRODUODENAL ARTERIOGRAM AND PERCUTANEOUS COIL EMBOLIZATION COMPARISON:  CTA abdomen and pelvis-earlier same day MEDICATIONS: None ANESTHESIA/SEDATION: Moderate (conscious) sedation was employed during this procedure. A total of Versed 1 mg and Fentanyl 50 mcg was administered intravenously. Moderate Sedation Time: 59 minutes. The patient's level of consciousness and vital signs were monitored continuously by radiology nursing throughout the  procedure under my direct supervision. CONTRAST:  100 cc Omnipaque 300 FLUOROSCOPY TIME:  19 minutes, 18 seconds (096 mGy) COMPLICATIONS: None immediate. PROCEDURE: Informed consent was obtained from the patient following explanation of the procedure, risks, benefits and alternatives. All questions were addressed. A time out was performed prior to the initiation of the procedure. Maximal barrier sterile technique utilized including caps, mask, sterile gowns, sterile gloves, large sterile drape, hand hygiene, and Betadine prep. The right femoral head was marked fluoroscopically. Under sterile conditions and local anesthesia, the right common femoral artery access was performed with a micropuncture needle. Under direct ultrasound guidance, the right common femoral was accessed with a micropuncture kit. An ultrasound image was saved for documentation purposes. This allowed for placement of a 5-French vascular sheath. A limited arteriogram was performed through the side arm of the sheath confirming appropriate access within the right common femoral artery. Over a Bentson wire, a Mickelson catheter was advanced the caudal aspect of the thoracic aorta where was reformed, back bled and flushed. The Mickelson catheter was then utilized to select the celiac artery and a selective celiac arteriogram was performed. With the use of a fathom 14 microwire, a regular Renegade microcatheter was advanced to the level of the gastroduodenal artery and a selective gastroduodenal arteriogram was performed. Next, the gastroduodenal artery was percutaneously coil embolized with multiple overlapping 5 mm, 6 mm, 8 mm and 10 mm diameter interlock coils to near the vessel's origin. Multiple selective arteriograms were performed during coil embolization. The microcatheter was retracted to the level of the common hepatic artery and a post embolization selective common hepatic arteriogram was performed. The microcatheter was removed and a  completion celiac arteriogram was performed through the [facility]  catheter. Images were reviewed and the procedure was terminated. All wires, catheters and sheaths were removed from the patient. Hemostasis was achieved at the right groin access site with deployment of an ExoSeal closure device and manual compression. A dressing was applied. The patient tolerated the procedure well without immediate post procedural complication. FINDINGS: Selective celiac arteriogram redemonstrates a replaced left hepatic artery arising from the left gastric artery. 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 Selective gastroduodenal arteriogram demonstrates an expected branching pattern giving rise to several pancreaticoduodenal arteries supplying the duodenal bulb descending portion of the duodenum. The GDA terminates in a hypertrophied right gastroepiploic artery. While a discrete area contrast extravasation was not identified, the GDA was prophylactically percutaneously coil embolized to near its origin given the endoscopic confirmation of the duodenal ulcer and definitive contrast extravasation demonstrated on preceding CTA. Completion selective common hepatic arteriogram and celiac arteriogram demonstrates a technically excellent results with occlusion of the GDA. IMPRESSION: 1. Technically successful prophylactic percutaneous coil embolization of the GDA secondary to bleeding duodenal ulcer demonstrated on preceding endoscopy and CTA. 2. 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 PLAN: - The patient is to remain flat for 4 hours with right leg straight. - As the patient was bleeding at the time of preceding CTA, he will likely experience several bloody/melanotic bowel movements and may require additional resuscitation, however  ultimately I am hopeful he will stabilize in the coming days. - Repeat endoscopy may be performed at the discretion of the GI service as indicated. Electronically Signed   By: Sandi Mariscal M.D.   On: 06/08/2021 08:04   IR EMBO ART  VEN HEMORR LYMPH EXTRAV  INC GUIDE ROADMAPPING  Result Date: 06/08/2021 INDICATION: Upper GI bleeding secondary to duodenal ulcer. Please perform mesenteric arteriogram and percutaneous embolization as indicated. EXAM: 1. ULTRASOUND GUIDANCE FOR ARTERIAL ACCESS 2. SELECTIVE CELIAC ARTERIOGRAM 3. SELECTIVE COMMON HEPATIC ARTERIOGRAM 4. SELECTIVE GASTRODUODENAL ARTERIOGRAM AND PERCUTANEOUS COIL EMBOLIZATION COMPARISON:  CTA abdomen and pelvis-earlier same day MEDICATIONS: None ANESTHESIA/SEDATION: Moderate (conscious) sedation was employed during this procedure. A total of Versed 1 mg and Fentanyl 50 mcg was administered intravenously. Moderate Sedation Time: 59 minutes. The patient's level of consciousness and vital signs were monitored continuously by radiology nursing throughout the procedure under my direct supervision. CONTRAST:  100 cc Omnipaque 300 FLUOROSCOPY TIME:  19 minutes, 18 seconds (494 mGy) COMPLICATIONS: None immediate. PROCEDURE: Informed consent was obtained from the patient following explanation of the procedure, risks, benefits and alternatives. All questions were addressed. A time out was performed prior to the initiation of the procedure. Maximal barrier sterile technique utilized including caps, mask, sterile gowns, sterile gloves, large sterile drape, hand hygiene, and Betadine prep. The right femoral head was marked fluoroscopically. Under sterile conditions and local anesthesia, the right common femoral artery access was performed with a micropuncture needle. Under direct ultrasound guidance, the right common femoral was accessed with a micropuncture kit. An ultrasound image was saved for documentation purposes. This allowed for placement of a 5-French vascular  sheath. A limited arteriogram was performed through the side arm of the sheath confirming appropriate access within the right common femoral artery. Over a Bentson wire, a Mickelson catheter was advanced the caudal aspect of the thoracic aorta where was reformed, back bled and flushed. The Mickelson catheter was then utilized to select the celiac  artery and a selective celiac arteriogram was performed. With the use of a fathom 14 microwire, a regular Renegade microcatheter was advanced to the level of the gastroduodenal artery and a selective gastroduodenal arteriogram was performed. Next, the gastroduodenal artery was percutaneously coil embolized with multiple overlapping 5 mm, 6 mm, 8 mm and 10 mm diameter interlock coils to near the vessel's origin. Multiple selective arteriograms were performed during coil embolization. The microcatheter was retracted to the level of the common hepatic artery and a post embolization selective common hepatic arteriogram was performed. The microcatheter was removed and a completion celiac arteriogram was performed through the [facility] catheter. Images were reviewed and the procedure was terminated. All wires, catheters and sheaths were removed from the patient. Hemostasis was achieved at the right groin access site with deployment of an ExoSeal closure device and manual compression. A dressing was applied. The patient tolerated the procedure well without immediate post procedural complication. FINDINGS: Selective celiac arteriogram redemonstrates a replaced left hepatic artery arising from the left gastric artery. 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 Selective gastroduodenal arteriogram demonstrates an expected branching pattern giving rise to several pancreaticoduodenal arteries supplying the duodenal bulb descending portion of the duodenum. The GDA terminates in a hypertrophied right  gastroepiploic artery. While a discrete area contrast extravasation was not identified, the GDA was prophylactically percutaneously coil embolized to near its origin given the endoscopic confirmation of the duodenal ulcer and definitive contrast extravasation demonstrated on preceding CTA. Completion selective common hepatic arteriogram and celiac arteriogram demonstrates a technically excellent results with occlusion of the GDA. IMPRESSION: 1. Technically successful prophylactic percutaneous coil embolization of the GDA secondary to bleeding duodenal ulcer demonstrated on preceding endoscopy and CTA. 2. 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 PLAN: - The patient is to remain flat for 4 hours with right leg straight. - As the patient was bleeding at the time of preceding CTA, he will likely experience several bloody/melanotic bowel movements and may require additional resuscitation, however ultimately I am hopeful he will stabilize in the coming days. - Repeat endoscopy may be performed at the discretion of the GI service as indicated. Electronically Signed   By: Sandi Mariscal M.D.   On: 06/08/2021 08:04   CT Angio Abd/Pel w/ and/or w/o  Result Date: 06/07/2021 CLINICAL DATA:  GI bleeding. EXAM: CTA ABDOMEN AND PELVIS WITHOUT AND WITH CONTRAST TECHNIQUE: Multidetector CT imaging of the abdomen and pelvis was performed using the standard protocol during bolus administration of intravenous contrast. Multiplanar reconstructed images and MIPs were obtained and reviewed to evaluate the vascular anatomy. CONTRAST:  183mL OMNIPAQUE IOHEXOL 350 MG/ML SOLN COMPARISON:  CT the chest, abdomen pelvis-10/10/2019 FINDINGS: VASCULAR Aorta: Scattered moderate amount of predominantly calcified atherosclerotic plaque within a mildly tortuous but normal caliber abdominal aorta, not resulting in a hemodynamically  significant stenosis. No evidence of abdominal aortic dissection or periaortic stranding. Celiac: Widely patent without hemodynamically significant narrowing. There is an approximately 0.7 x 0.6 cm contained penetrating atherosclerotic ulcer involving the proximal aspect of the common hepatic artery (coronal image 42, series 9), with associated non flow limiting intimal web. The left gastric artery is noted to give rise to a replaced left hepatic artery. The exact etiology of the gastric antral and duodenal bleeding is not definitely identified though presumably secondary to a small pancreaticoduodenal branch arising  from the [place]. SMA: There is a minimal amount of calcified atherosclerotic plaque involving the origin of this SMA, not resulting in hemodynamically significant stenosis. Conventional branching pattern. The distal tributaries the SMA appear widely patent without discrete intraluminal filling defect to suggest distal embolism. Renals: There is a tiny accessory right renal artery which supplies the inferior pole of the right kidney. Solitary left renal artery. There is a minimal amount of mixed calcified and noncalcified atherosclerotic plaque involving the origin of left renal artery, not resulting in a hemodynamically significant stenosis. No vessel irregularity to suggest FMD. IMA: Remains patent. Inflow: There is a minimal amount of eccentric predominantly calcified atherosclerotic plaque involving the bilateral common iliac arteries, not resulting in hemodynamically significant stenosis. The bilateral internal iliac arteries are disease though patent and of normal caliber. The bilateral external iliac arteries are of normal caliber and widely patent without a hemodynamically significant narrowing. Proximal Outflow: There is a moderate amount of eccentric calcified and noncalcified atherosclerotic plaque involving the bilateral common femoral arteries, not resulting in a hemodynamically significant  stenosis. The imaged portions of the bilateral deep and superficial femoral arteries are normal caliber and widely patent without hemodynamically significant narrowing. Veins: The IVC and pelvic venous systems appear widely patent. Review of the MIP images confirms the above findings. _________________________________________________________ NON-VASCULAR Lower chest: Limited visualization of the lower thorax demonstrates trace bilateral pleural effusions with associated bibasilar consolidative opacities. Normal heart size.  No pericardial effusion. Hepatobiliary: Normal hepatic contour. Redemonstrated punctate cysts within the dome of the liver, similar to the 09/2019 examination. Additional subcentimeter hypoattenuating hepatic lesions are too small to adequately characterize though favored to represent additional hepatic cysts. No discrete worrisome hepatic lesions. Normal appearance of the gallbladder given degree distention. No radiopaque gallstones. No intra or extrahepatic biliary duct dilatation. No ascites. Pancreas: Normal appearance of the pancreas. Spleen: Normal appearance of the spleen. Adrenals/Urinary Tract: Nonobstructing renal stones are seen bilaterally right significantly greater than left including a dominant nonobstructing layering stone within the posteroinferior aspect of the right kidney measuring 1.8 x 0.9 cm (image 35, series 6, morphologically similar to the 09/2019 examination. Bilateral subcentimeter hypoattenuating renal lesions are too small to adequately characterize though favored to represent renal cysts. No urinary obstruction or perinephric stranding. Normal appearance the bilateral adrenal glands. There is minimal mass effect of the prostate gland upon the undersurface of the urinary bladder. Otherwise, normal appearance of the urinary bladder given degree of distention. Stomach/Bowel: There is discrete intraluminal contrast extravasation at the level of the gastric antrum,  duodenal bulb and descending portion of the duodenum (axial images 43 through 57, series 7; coronal images 36 through 48, series 9), likely compatible with provided history of duodenal ulcer and the etiology of reported history of GI bleeding. Circumferential bowel wall thickening involving several loops of mid small bowel within the left mid hemiabdomen (representative images 23 through 30, series 10), not resulting in enteric obstruction, likely reactive in etiology given upstream GI bleeding. Scattered colonic diverticulosis without evidence of superimposed acute diverticulitis. No significant hiatal hernia. No pneumoperitoneum, pneumatosis or portal venous gas. Lymphatic: No bulky retroperitoneal, mesenteric, pelvic or inguinal lymphadenopathy. Reproductive: Dystrophic calcifications within a mildly enlarged prostate gland which has minimal mass effect on the undersurface of the urinary bladder. Other: Mild diffuse body wall anasarca, most conspicuous about the lateral aspects of the proximal thighs bilaterally. Musculoskeletal: No acute or aggressive osseous abnormalities. Mild-to-moderate multilevel lumbar spine DDD, worse at L4-L5 and L5-S1 with disc space height  loss, endplate irregularity and sclerosis. IMPRESSION: 1. Examination is positive for acute intraluminal contrast at the level of the gastric antrum and duodenal bulb, presumably secondary to reported history of duodenal ulcer. The exact etiology of the gastric antral and duodenal bleeding is not definitely identified though presumably secondary to a small pancreaticoduodenal branch arising from the GDA. 2. Scattered atherosclerotic plaque within normal caliber abdominal aorta, not resulting in a hemodynamically significant stenosis. Aortic Atherosclerosis (ICD10-I70.0). 3. 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 4. Non  conventional mesenteric anatomy as above. Critical Value/emergent results were called by telephone at the time of interpretation on 06/07/2021 at 4:17 pm to provider Salley Slaughter , who verbally acknowledged these results. Electronically Signed   By: Sandi Mariscal M.D.   On: 06/07/2021 16:26     LOS: 3 days   Antonieta Pert, MD [facility]  06/09/2021, 1:19 PM

## 2021-06-09 NOTE — Progress Notes (Signed)
   NAME:  Martin Mendez, MRN:  [id number], DOB:  September 02, 1946, LOS: 3 ADMISSION DATE:  06/05/2021, CONSULTATION DATE:  06/08/2021 REFERRING MD:  Maren Beach, CHIEF COMPLAINT:  Hypercarbia / Respiratory Acidosis   History of Present Illness:  75 year old male with PTSD, tobacco use, history of syncope with unclear cause, presented with hematemesis presyncope and melena and had drop in hemoglobin from 15 to 12 g, positive fecal test, 1 unit PRBC transfused, GI was consulted and patient was admitted.  Patient underwent EGD 7/23 that showed gastritis, duodenitis, non bleeding duodenal ulcer.  Hospital course complicated by acute hypoxic and hypercapnic respiratory failure ABG with pH 7.275 PCO2 82.9 chest x-ray hyperinflated lungs but patient fairly alert awake oriented not in distress BiPAP had been ordered for the night, but patient was unable to tolerate it due to his PTSD and anxiety. CO2 per Chemistry is 37-38. PCCM have been asked to evaluate for suspected undiagnosed COPD and hypercarbic respiratory failure.   Pertinent  Medical History  PTSD  Significant Hospital Events:  7/24 Admission  7/23 EGD  7/27 Able to tolerated BIPAP 6 hours overnight  Interim History / Subjective:  No acute events overnight  States he feels well with no acute complaints, is asking when he will be discharged home  ABG prior to CPAP use overnight remains consistent with hypercapnia with CO2 81.4  Objective   Blood pressure 98/61, pulse 82, temperature 98.3 F (36.8 C), temperature source Oral, resp. rate 18, height 5\' 10"  (1.778 m), weight 72.6 kg, SpO2 96 %.        Intake/Output Summary (Last 24 hours) at 06/09/2021 0958 Last data filed at 06/09/2021 0700 Gross per 24 hour  Intake 600 ml  Output 650 ml  Net -50 ml    Filed Weights   06/05/21 0251 06/05/21 1316  Weight: 72.6 kg 72.6 kg    Examination: General: Very pleasant elderly male lying in bed in NAD HEENT: /AT, MM pink/moist, PERRL,  Neuro: Alert  and oriented x2, non-focal, no increased work of breathing  CV: s1s2 regular rate and rhythm, no murmur, rubs, or gallops,  PULM:  Clear to ascultation bilaterally, no increased work of breathing, no added breath sounds GI: soft, bowel sounds active in all 4 quadrants, non-tender, non-distended Extremities: warm/dry, no edema  Skin: no rashes or lesions  Resolved Hospital Problem list     Assessment & Plan:  Acute Hypercarbic respiratory failure 2/2 suspected undiagnosed COPD/ OSA with suspected chronic CO2 retention. Unable to tolerate CPAP/ BiPAP due to PTSD and anxiety PH 7.27, CO2 of 82 CXR with hyperinflated lungs P: Continue CPAP qhrs and PRN  ABG prior to CPAP use overnight consistent with hypercapnia Patient likely mets criteria for NIV at discharge   Wean supplemental oxygen to maintain SpO2 of 88-92% Continue Short acting bronchodilators  Will need for follow up on the outpatient setting for formal PFTs and outpatient sleep study Minimize sedating medications   Follow cultures  Best Practice   Diet/type: Per Primary Team DVT prophylaxis: other GI prophylaxis: N/A Lines: N/A Foley:  N/A Code Status:  full code Last date of multidisciplinary goals of care discussion: Per primary   Critical care time: NA   Jakeb Lamping D. Kenton Kingfisher, NP-C [facility] Personal contact information can be found on Amion  06/09/2021, 11:24 AM

## 2021-06-09 NOTE — Progress Notes (Signed)
Patient requesting to try FFM for CPAP.

## 2021-06-09 NOTE — Progress Notes (Addendum)
Patient placed on cpap at this time with 2L of O2 bled in. Patient is tolerating well.

## 2021-06-10 DIAGNOSIS — G459 Transient cerebral ischemic attack, unspecified: Secondary | ICD-10-CM | POA: Diagnosis not present

## 2021-06-10 DIAGNOSIS — K922 Gastrointestinal hemorrhage, unspecified: Secondary | ICD-10-CM | POA: Diagnosis not present

## 2021-06-10 LAB — BLOOD GAS, VENOUS
Acid-Base Excess: 12.8 mmol/L — ABNORMAL HIGH (ref 0.0–2.0)
Bicarbonate: 37.8 mmol/L — ABNORMAL HIGH (ref 20.0–28.0)
Drawn by: 1405
FIO2: 28
O2 Saturation: 88 %
Patient temperature: 36.6
pCO2, Ven: 56.5 mmHg (ref 44.0–60.0)
pH, Ven: 7.439 — ABNORMAL HIGH (ref 7.250–7.430)
pO2, Ven: 51.3 mmHg — ABNORMAL HIGH (ref 32.0–45.0)

## 2021-06-10 LAB — CBC
HCT: 30 % — ABNORMAL LOW (ref 39.0–52.0)
Hemoglobin: 10 g/dL — ABNORMAL LOW (ref 13.0–17.0)
MCH: 30.6 pg (ref 26.0–34.0)
MCHC: 33.3 g/dL (ref 30.0–36.0)
MCV: 91.7 fL (ref 80.0–100.0)
Platelets: 198 10*3/uL (ref 150–400)
RBC: 3.27 MIL/uL — ABNORMAL LOW (ref 4.22–5.81)
RDW: 12.4 % (ref 11.5–15.5)
WBC: 7.6 10*3/uL (ref 4.0–10.5)
nRBC: 0 % (ref 0.0–0.2)

## 2021-06-10 LAB — LIPID PANEL
Cholesterol: 122 mg/dL (ref 0–200)
HDL: 35 mg/dL — ABNORMAL LOW (ref >40)
LDL Cholesterol: 77 mg/dL (ref 0–99)
Total CHOL/HDL Ratio: 3.5 RATIO
Triglycerides: 50 mg/dL (ref <150)
VLDL: 10 mg/dL (ref 0–40)

## 2021-06-10 MED ORDER — PANTOPRAZOLE SODIUM 40 MG PO TBEC: 40.0000 mg | DELAYED_RELEASE_TABLET | Freq: Two times a day (BID) | ORAL | 0 refills | Status: DC

## 2021-06-10 MED ORDER — UMECLIDINIUM BROMIDE 62.5 MCG/INH IN AEPB: 1.0000 | INHALATION_SPRAY | Freq: Every day | RESPIRATORY_TRACT | 0 refills | Status: DC

## 2021-06-10 MED ORDER — METRONIDAZOLE 500 MG PO TABS: 500.0000 mg | ORAL_TABLET | Freq: Two times a day (BID) | ORAL | 0 refills | Status: AC

## 2021-06-10 MED ORDER — CLARITHROMYCIN 500 MG PO TABS: 500.0000 mg | ORAL_TABLET | Freq: Two times a day (BID) | ORAL | 0 refills | Status: AC

## 2021-06-10 MED ORDER — ALBUTEROL SULFATE HFA 108 (90 BASE) MCG/ACT IN AERS: 2.0000 | INHALATION_SPRAY | Freq: Four times a day (QID) | RESPIRATORY_TRACT | 2 refills | Status: DC | PRN

## 2021-06-10 NOTE — Progress Notes (Signed)
NAME:  Martin Mendez, MRN:  [id number], DOB:  [birth year], LOS: 4 ADMISSION DATE:  06/05/2021, CONSULTATION DATE:  06/08/2021 REFERRING MD:  Maren Beach, CHIEF COMPLAINT:  Hypercarbia / Respiratory Acidosis   History of Present Illness:  75 year old male with PTSD, tobacco use, history of syncope with unclear cause, presented with hematemesis presyncope and melena and had drop in hemoglobin from 15 to 12 g, positive fecal test, 1 unit PRBC transfused, GI was consulted and patient was admitted.  Patient underwent EGD 7/23 that showed gastritis, duodenitis, non bleeding duodenal ulcer.  Hospital course complicated by acute hypoxic and hypercapnic respiratory failure ABG with pH 7.275 PCO2 82.9 chest x-ray hyperinflated lungs but patient fairly alert awake oriented not in distress BiPAP had been ordered for the night, but patient was unable to tolerate it due to his PTSD and anxiety. CO2 per Chemistry is 37-38. PCCM have been asked to evaluate for suspected undiagnosed COPD and hypercarbic respiratory failure.   Pertinent  Medical History  PTSD  Significant Hospital Events:  7/24 Admission  7/23 EGD  7/27 Able to tolerated BIPAP 6 hours overnight  Interim History / Subjective:  Wore NPPV yesterday during afternoon during rest period.  Overnight complaints of a sore throat and dried mucous membraines. Did not wear the CPAP due to this. Utilized chloraceptic spray three times overnight. Feels that the mask that he is using is more comfortable than the other.  Denies SOB, Cough, chest pain.   Objective   Blood pressure 102/66, pulse 78, temperature 97.8 F (36.6 C), temperature source Oral, resp. rate 18, height 5\' 10"  (1.778 m), weight 72.6 kg, SpO2 100 %.   On 2L [place]     Intake/Output Summary (Last 24 hours) at 06/10/2021 0810 Last data filed at 06/10/2021 0700 Gross per 24 hour  Intake --  Output 860 ml  Net -860 ml    Filed Weights   06/05/21 0251 06/05/21 1316  Weight: 72.6 kg 72.6 kg     Examination: General:  sitting in bed, pleasant, NAD HEENT: MM pink/moist, anicteric, atraumatic Neuro: GCS 15, RASS 0, PERRL CV: S1S2, NSR, no m/r/g appreciated PULM:  clear in the upper lobes, in the lower lobes bilaterally, trachea midline, chest expansion symmetric GI: soft, bsx4 active, non distended   Extremities: warm/dry, no pretibial edema, capillary refill greater/less than 3 seconds  Skin: no rashes or lesions  Resolved Hospital Problem list     Assessment & Plan:  Acute Hypercarbic respiratory failure 2/2 suspected undiagnosed COPD/ OSA with suspected chronic CO2 retention. Unable to tolerate CPAP/ BiPAP due to PTSD and anxiety PH 7.27, CO2 of 82 CXR with hyperinflated lungs Patient wore NPPV for rest period during but did not wear overnight due to c/o sore throat and dry mucous membranes. States that mask is comfortable. P: -Continue NPPV qHS and PRN. Stressed importance of need for utilizing NPPV at these times because his respiratory failure is life threatening. -NIV at discharge. Appreciate TOC assistance. In progress. -Will need follow up Pulmonology care. Informed patient of this. Appointment scheduled by CMA. Will need formal PFTs and outpatient sleep study. -Continue PRN albuterol nebulizer and scheduled incruse  -Minimize sedating medications -Goal SPO2 88-92%. 100% on 2L on assessment. Turned off oxygen. Notified Ray Therapist, sports. O2 was not humidified. Humidify cannula if in use. -Continue PRN chloraseptic spray for sore throat while inpatient. -Continue PRN biotine and saline gel for dry mucous membranes.   Best Practice   Diet/type: Per primary DVT prophylaxis: SCD GI prophylaxis:  PPI Lines: N/A Foley:  N/A Code Status:  full code Last date of multidisciplinary goals of care discussion: Per primary  Critical care time: N/A   Redmond School., MSN, APRN, AGACNP-BC [facility]  06/10/2021 , 8:14 AM  Please see [email or web address] for  pager details  If no response, please call [phone] After hours, please call Elink at [phone]

## 2021-06-10 NOTE — Consult Note (Addendum)
Stroke Neurology Consultation Note  Consult Requested by: Dr. Maren Beach  Reason for Consult: TIA  Consult Date:  06/10/21  History of Present Illness:  Martin Mendez is a 75 y.o. 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 He was preparing for discharge earlier today and had called to speak with his significant other. She noticed that he was not making sense when he was talking and seemed to have some word finding difficulties during the conversation and called the nurses station, requesting evaluation of this.   I spoke with the charge nurse, who initially saw him after receiving the call from the significant other. Upon her entering the room, she notes that his speech was clear and he had denied any other symptoms.  Symptoms had resolved by the time of my visit with him.  He admits to having his "tongue tied" for a brief period when speaking to his wife this afternoon and attributes it to having just woken up. He notes that this resolved within 5-10 minutes of it occurring. Denies any associated symptoms such as dizziness, lightheadedness, chest pain, difficulty breathing, or focal weakness during the event.  He admits to similar episodes in the past upon being woken up.   Family history significant for strokes in his father and brother. He reports ongoing tobacco use. Currently smokes around 6 cigarettes per day. Previously smoked 2-3 packs per day.  Denies alcohol or illicit drug use.    Past Medical History:  Diagnosis Date   PTSD (post-traumatic stress disorder)      Past Surgical History:  Procedure Laterality Date   ESOPHAGOGASTRODUODENOSCOPY (EGD) WITH PROPOFOL N/A 06/05/2021   Procedure: ESOPHAGOGASTRODUODENOSCOPY (EGD) WITH PROPOFOL;  Surgeon: [name], MD;  Location: [facility] ENDOSCOPY;  Service: Endoscopy;  Laterality: N/A;   IR ANGIOGRAM FOLLOW UP STUDY  06/07/2021   IR ANGIOGRAM SELECTIVE EACH ADDITIONAL  VESSEL  06/07/2021   IR ANGIOGRAM VISCERAL SELECTIVE  06/07/2021   IR EMBO ART  VEN HEMORR LYMPH EXTRAV  INC GUIDE ROADMAPPING  06/07/2021   IR US GUIDE VASC ACCESS RIGHT  06/07/2021    Family history: significant for stroke in his father and brother  Social History:  reports that he has been smoking cigarettes. He has been smoking an average of .5 packs per day. He has never used smokeless tobacco. He reports that he does not drink alcohol and does not use drugs.  Review of Systems: negative unless stated in the HPI Allergies:  Allergies  Allergen Reactions   Penicillins Swelling    Did it involve swelling of the face/tongue/throat, SOB, or low BP? No Did it involve sudden or severe rash/hives, skin peeling, or any reaction on the inside of your mouth or nose? No Did you need to seek medical attention at a hospital or doctor's office? No When did it last happen?    55 years ago (age 55)   If all above answers are "NO", may proceed with cephalosporin use.  Swelling of arms and legs   Sulfa Antibiotics Rash    tongue     Medications:  No current facility-administered medications on file prior to encounter.   Current Outpatient Medications on File Prior to Encounter  Medication Sig Dispense Refill   acetaminophen (TYLENOL) 500 MG tablet Take 500 mg by mouth daily.     citalopram (CELEXA) 20 MG tablet TAKE 1 TABLET BY MOUTH ONCE DAILY IN THE MORNING (Patient taking differently: Take 20 mg  by mouth daily.) 90 tablet 2   loratadine (CLARITIN) 10 MG tablet Take 10 mg by mouth daily.     propranolol (INDERAL) 10 MG tablet TAKE 1 TABLET BY MOUTH ONCE DAILY AS NEEDED (Patient taking differently: Take 10 mg by mouth daily as needed (anxiety).) 30 tablet 0     Test Results: CBC:  Recent Labs  Lab 06/05/21 0321 06/05/21 1529 06/08/21 0101 06/09/21 0853 06/10/21 0207  WBC 10.5   < > 9.6  --  7.6  NEUTROABS 7.5  --   --   --   --   HGB 12.9*   < > 11.3* 9.9* 10.0*  HCT 40.5   < >  35.1* 30.2* 30.0*  MCV 96.9   < > 93.6  --  91.7  PLT 240   < > 199  --  198   < > = values in this interval not displayed.   Basic Metabolic Panel:  Recent Labs  Lab 06/07/21 0323 06/08/21 0101  NA 135 137  K 4.0 3.7  CL 95* 95*  CO2 38* 37*  GLUCOSE 101* 100*  BUN 19 25*  CREATININE 0.64 0.62  CALCIUM 8.2* 8.3*  MG 1.6*  --   PHOS 2.0*  --    Liver Function Tests: Recent Labs  Lab 06/05/21 0321  AST 15  ALT 10  ALKPHOS 41  BILITOT 0.5  PROT 4.8*  ALBUMIN 2.7*   No results for input(s): LIPASE, AMYLASE in the last 168 hours. No results for input(s): AMMONIA in the last 168 hours. Coagulation Studies:  Recent Labs    06/07/21 1633  LABPROT 14.4  INR 1.1    Imaging:  CT head: pending CTA head/neck: pending MRI brain: pending  Physical Examination: Temp:  [97.8 F (36.6 C)-98.9 F (37.2 C)] 97.8 F (36.6 C) (07/28 1242) Pulse Rate:  [76-91] 76 (07/28 1242) Resp:  [16-18] 17 (07/28 1242) BP: (93-102)/(57-67) 93/67 (07/28 1242) SpO2:  [96 %-100 %] 100 % (07/28 1242)  General: well appearing, in no acute distress Cardiac: RRR, extremities warm  Neurologic exam Mental status:  alert and oriented. Normal speech pattern and articulation.  Cranial nerves:  II- PERRL III,IV, VI: EOMs intact. Peripheral visual fields intact V- sensation equal bilaterally VII: face symmetric, no facial droop VIII: hearing intact IX, X: uvula midline XI: shoulder shrug strength equal bilaterally XII: tongue midline Motor: 5/5 strength in the bilateral upper and lower extremities. Normal muscle tone. No tremor Sensory: equal and intact in all extremities Cerebellum: normal finger to nose, heel to shin Gait: deferred   Assessment:  [name] is a 75 y.o. male who was admitted on 06/05/21 for an upper GI bleed and was ready to discharge earlier today. Upon speaking with his significant other on the phone, he was noted to have difficulty with word finding and was  speaking non-sensibly. Symptoms resolved within 5-10 mins and neurology was consulted for further evaluation.  Impression: TIA vs hypercarbic encephalopathy  TIA risk factors include age, smoking history and family history. ABCD2 score 2 (age (1), speech (1))--low risk.  He is also has chronic hypercapnic respiratory failure due to COPD. Last pCO2 was 81.4, bicarb 41.4. He may also have developed worsened hypercapnia, leading to the non-sensible speech and word finding difficulty noted by the significant other. Drowsiness leading to poor speech articulation on awakening is also on the DDx.   Recommendations: - Lipid panel, A1C, VBG - CTA head and neck - MRI brain - Echocardiogram -  Tele monitoring - Allow for permissive hypertension for the first 24 hours. Blood pressure goal less than 220/120. - ST, PT, OT - May resume prior diet if he passes bedside swallow eval - No antiplatelet therapy in the setting of recent GI bleed - Start crestor 20mg  daily  Hospital day # 4   Thank you for this consultation and allowing [place] to participate in the care of this patient.  Mitzi Hansen, MD Internal Medicine Resident PGY-3 [facility] Pager: [phone] 06/10/2021 5:25 PM      I have seen and examined the patient. I have reviewed and agree with the assessment and recommendations. 75 year old male with transient speech deficit. Exam nonfocal. DDx includes TIA, AMS secondary to hypercapnia and drowsiness leading to poor speech articulation on awakening. Recommendations as above.  Electronically signed: Dr. Kerney Elbe;

## 2021-06-10 NOTE — Care Management (Addendum)
[id number] 06-10-21 [employer] is following the patient for possible home trilogy. Awaiting insurance authorization approval prior to transition home. Staff RN is aware to await insurance authorization confirmation before allowing the patient to transition home. Case Manager will continue to follow for additional transition of care needs.    [id number] 06-10-21 [employer] Rep spoke with staff RN regarding Tr[facility] approval. No further needs from Case Manager at this time.

## 2021-06-10 NOTE — Discharge Summary (Addendum)
Physician Discharge Summary  Martin Mendez QBV:[id number] DOB: [birth year] DOA: 06/05/2021  PCP: Lavone Orn, MD  Admit date: 06/05/2021 Discharge date: 06/11/2021  Admitted From: home Disposition:  hom  Recommendations for Outpatient Follow-up:  Follow up with PCP in 1-2 weeks, and with gastroenterology and pulmonary as instructed in a week time Please obtain BMP/CBC in one week  Home Health:no  Equipment/Devices: NIV  Discharge Condition: Stable Code Status:   Code Status: Full Code Diet recommendation:  Diet Order             Diet - low sodium heart healthy           DIET SOFT Room service appropriate? Yes; Fluid consistency: Thin  Diet effective now                    Brief/Interim Summary: 75 year old male with PTSD, tobacco use, history of syncope with unclear cause, presented with hematemesis presyncope and melena and had drop in hemoglobin from 15 to 12 g, positive fecal test, 1 unit PRBC transfused, GI was consulted and patient was admitted.  Patient underwent EGD 7/23 that showed gastritis, duodenitis, nonbleeding duodenal ulcer. Hospital course complicated by acute hypoxic and hypercapnic respiratory failure ABG with pH 7.35 PCO2 80 chest x-ray hyperinflated lungs but patient fairly alert awake oriented not in distress BiPAP has been ordered for the night. Patient had 2 episodes of bright red blood per rectum GI was notified and CT angio was positive subsequently underwent mesenteric arteriogram and percutaneous coil embolization of GDA 7/25 No recurrent GI bleed since, hemoglobin has been stable and improved to 10 g.  Seen by pulmonary advised NIV at home and he is medically stable discharge once an IV is arranged.  Discharge Diagnoses:   Acute GI bleeding: underwent EGD 7/23 that showed gastritis, duodenitis, nonbleeding duodenal ulcer.had 2 episodes of bright red etiology blood per rectum GI was notified and CT angio was positive subsequently underwent mesenteric  arteriogram and percutaneous coil embolization of GDA 7/25.  Slightly downtrending hemoglobin keep on PPI twice daily GI recommending H. pylori treatment x14 days upon discharge.  Tolerating soft diet.  H&H stable no recurrence of bleeding   Acute blood loss anemia: 2/2 #1.status post 1 unit PRBC.  Hemoglobin improved to 10 g.  CBC in 1 week.   Acute respiratory failure with hypoxia and hypercapnia COPD Longstanding smoking history ABG shows chronic CO2 retention secondary to COPD tolerating NIV started on Incruse daily, smoking cessation and arranging home NIV.  PTSD/anxiety mood is stable, cont Celexa Tobacco use disorder continue nicotine patch.  Cessation advised. Hypomagnesemia/Hypophosphatemia  repleted.  Acute right small cortical stroke 06/10/21:patient had transient episode of expressive aphasia seen by Neuro- MRI + w/ small cortical stroke on rt frontal lobe.hba1c 5.7, LDL 77. Neuro discussed w/ with patient about aspirin and statin- he refused it. GI was okay with trying aspirin 81 mg.  Patient will follow up with neurology as outpatient.  CT angio head and neck pending as patient refused to do it last night and if its normal will be discharged home.    Per Pulmonary: "Patient's chronic respiratory failure due to COPD is life threatening.  Previous ABG's have documented high PCO2 in setting of suspected severe obstructive ventilatory defect.  Patient would benefit from non-invasive ventilation.  Without this therapy, the patient is at high risk of ending up with worsening symptoms, worsened respiratory failure, need for ER visits and/or recurrent hospitalizations.  Bilevel device unable to adequately support  patient's nocturnal ventilation needs.  Patient would benefit from NIV therapy with set tidal volumes and pressure.   Patient continues to exhibit signs of hypercapnia associated with chronic respiratory failures secondary to severe COPD. Patient requires the use of NIV both QHS and  daytime to help with exacerbation periods. The use of the NIV will treat patient's high PCO2 levels and can reduce risk of exacerbations and future hospitalizations when used at nigh and during the day. Patient will need this advanced settings in conjunction with her current medication regimen; BiPAP is not an option due to its functional limitations and the severity of the patient's condition.   Trilogy Parameters   [X] VAPS VT: 6-8cc/kg IBW, RR 8-15 BPM, IPAP Min 8-12, IPAP Max 12-32, EPAP 4-12, AVAPS Rate 3-5   [X] AVAPS AE VT: 6-8 cc/kg IBW, RR 8-15 BPM, PS Min 4-10, PS Max 8-20, EPAP Min 4-12, EPAP Max 12-20, Max Pressure 32, AVAPS Rate 3-5     [X] Duel Setting for Exertion or Respiratory Distress VT: 10cc/kg IBW, RR 14-16, AVAPS Rate 5   [X] Set Volume, Pressure and Rate Alarms per patient condition [X] Fit and modify mask as needed"   Consults: PCCM  Subjective: Alert awake oriented no focal weakness no specific seizure following 7  Discharge Exam: Vitals:   06/10/21 2014 06/11/21 0457  BP: 101/77 114/74  Pulse: 79 80  Resp: 18 18  Temp: 98 F (36.7 C) 98.6 F (37 C)  SpO2: 98% 99%   General exam: AAOx 3 older than stated age, weak appearing. HEENT:Oral mucosa moist, Ear/Nose WNL grossly, dentition normal. Respiratory system: bilaterally diminished,  no use of accessory muscle Cardiovascular system: S1 & S2 +, No JVD,. Gastrointestinal system: Abdomen soft, NT,ND, BS+ Nervous System:Alert, awake, moving extremities and grossly nonfocal Extremities: no edema, distal peripheral pulses palpable.  Skin: No rashes,no icterus. MSK: Normal muscle bulk,tone, power   Discharge Instructions  Discharge Instructions     Diet - low sodium heart healthy   Complete by: As directed    Discharge instructions   Complete by: As directed    Follow-up with the pulmonary doctor regarding further care of failed BiPAP/COPD  Follow-up with gastroenterology regarding H. pylori  eradication and regarding your ulcer  Please call call MD or return to ER for similar or worsening recurring problem that brought you to hospital or if any fever,nausea/vomiting,abdominal pain, uncontrolled pain, chest pain,  shortness of breath or any other alarming symptoms.  Please follow-up your doctor as instructed in a week time and call the office for appointment.  Please avoid alcohol, smoking, or any other illicit substance and maintain healthy habits including taking your regular medications as prescribed.  You were cared for by a hospitalist during your hospital stay. If you have any questions about your discharge medications or the care you received while you were in the hospital after you are discharged, you can call the unit and ask to speak with the hospitalist on call if the hospitalist that took care of you is not available.  Once you are discharged, your primary care physician will handle any further medical issues. Please note that NO REFILLS for any discharge medications will be authorized once you are discharged, as it is imperative that you return to your primary care physician (or establish a relationship with a primary care physician if you do not have one) for your aftercare needs so that they can reassess your need for medications and monitor your lab values  Increase activity slowly   Complete by: As directed    No wound care   Complete by: As directed       Allergies as of 06/11/2021       Reactions   Penicillins Swelling   Did it involve swelling of the face/tongue/throat, SOB, or low BP? No Did it involve sudden or severe rash/hives, skin peeling, or any reaction on the inside of your mouth or nose? No Did you need to seek medical attention at a hospital or doctor's office? No When did it last happen?    61 years ago (age 47)   If all above answers are "NO", may proceed with cephalosporin use. Swelling of arms and legs   Sulfa Antibiotics Rash   tongue         Medication List     TAKE these medications    acetaminophen 500 MG tablet Commonly known as: TYLENOL Take 500 mg by mouth daily.   albuterol 108 (90 Base) MCG/ACT inhaler Commonly known as: VENTOLIN HFA Inhale 2 puffs into the lungs every 6 (six) hours as needed for wheezing or shortness of breath.   clarithromycin 500 MG tablet Commonly known as: BIAXIN Take 1 tablet (500 mg total) by mouth 2 (two) times daily for 14 days.   loratadine 10 MG tablet Commonly known as: CLARITIN Take 10 mg by mouth daily.   metroNIDAZOLE 500 MG tablet Commonly known as: Flagyl Take 1 tablet (500 mg total) by mouth 2 (two) times daily for 14 days.   pantoprazole 40 MG tablet Commonly known as: PROTONIX Take 1 tablet (40 mg total) by mouth 2 (two) times daily.   umeclidinium bromide 62.5 MCG/INH Aepb Commonly known as: INCRUSE ELLIPTA Inhale 1 puff into the lungs daily.       ASK your doctor about these medications    citalopram 20 MG tablet Commonly known as: CELEXA TAKE 1 TABLET BY MOUTH ONCE DAILY IN THE MORNING   propranolol 10 MG tablet Commonly known as: INDERAL TAKE 1 TABLET BY MOUTH ONCE DAILY AS NEEDED        Follow-up Information     [facility] Follow up.   Why: following for BIPAP or CPAP needs. Contact information: [place] [address] [phone]         Lavone Orn, MD Follow up in 1 week(s).   Specialty: Internal Medicine Contact information: [address] [place] [address] [phone]                Allergies  Allergen Reactions   Penicillins Swelling    Did it involve swelling of the face/tongue/throat, SOB, or low BP? No Did it involve sudden or severe rash/hives, skin peeling, or any reaction on the inside of your mouth or nose? No Did you need to seek medical attention at a hospital or doctor's office? No When did it last happen?    79 years ago (age 75)   If all above answers  are "NO", may proceed with cephalosporin use.  Swelling of arms and legs   Sulfa Antibiotics Rash    tongue    The results of significant diagnostics from this hospitalization (including imaging, microbiology, ancillary and laboratory) are listed below for reference.    Microbiology: Recent Results (from the past 240 hour(s))  Resp Panel by RT-PCR (Flu A&B, Covid) Nasopharyngeal Swab     Status: None   Collection Time: 06/05/21  3:21 AM   Specimen: Nasopharyngeal  Swab; Nasopharyngeal(NP) swabs in vial transport medium  Result Value Ref Range Status   SARS Coronavirus 2 by RT PCR NEGATIVE NEGATIVE Final    Comment: (NOTE) SARS-CoV-2 target nucleic acids are NOT DETECTED.  The SARS-CoV-2 RNA is generally detectable in upper respiratory specimens during the acute phase of infection. The lowest concentration of SARS-CoV-2 viral copies this assay can detect is 138 copies/mL. A negative result does not preclude SARS-Cov-2 infection and should not be used as the sole basis for treatment or other patient management decisions. A negative result may occur with  improper specimen collection/handling, submission of specimen other than nasopharyngeal swab, presence of viral mutation(s) within the areas targeted by this assay, and inadequate number of viral copies(<138 copies/mL). A negative result must be combined with clinical observations, patient history, and epidemiological information. The expected result is Negative.  Fact Sheet for Patients:  EntrepreneurPulse.com.au  Fact Sheet for Healthcare Providers:  IncredibleEmployment.be  This test is no t yet approved or cleared by the [place] FDA and  has been authorized for detection and/or diagnosis of SARS-CoV-2 by FDA under an Emergency Use Authorization (EUA). This EUA will remain  in effect (meaning this test can be used) for the duration of the COVID-19 declaration under Section 564(b)(1)  of the Act, 21 U.S.C.section 360bbb-3(b)(1), unless the authorization is terminated  or revoked sooner.       Influenza A by PCR NEGATIVE NEGATIVE Final   Influenza B by PCR NEGATIVE NEGATIVE Final    Comment: (NOTE) The Xpert Xpress SARS-CoV-2/FLU/RSV plus assay is intended as an aid in the diagnosis of influenza from Nasopharyngeal swab specimens and should not be used as a sole basis for treatment. Nasal washings and aspirates are unacceptable for Xpert Xpress SARS-CoV-2/FLU/RSV testing.  Fact Sheet for Patients: EntrepreneurPulse.com.au  Fact Sheet for Healthcare Providers: IncredibleEmployment.be  This test is not yet approved or cleared by the [place] FDA and has been authorized for detection and/or diagnosis of SARS-CoV-2 by FDA under an Emergency Use Authorization (EUA). This EUA will remain in effect (meaning this test can be used) for the duration of the COVID-19 declaration under Section 564(b)(1) of the Act, 21 U.S.C. section 360bbb-3(b)(1), unless the authorization is terminated or revoked.  Performed at [facility], [place] [address] [place], [place] [address]     Procedures/Studies: MR BRAIN WO CONTRAST  Result Date: 06/11/2021 CLINICAL DATA:  Neuro deficit, acute, stroke suspected. EXAM: MRI HEAD WITHOUT CONTRAST TECHNIQUE: Multiplanar, multiecho pulse sequences of the brain and surrounding structures were obtained without intravenous contrast. COMPARISON:  Head CT October 10, 2019. FINDINGS: Brain: Small cortical focus of restricted diffusion in the right frontal lobe (series 2, image 28). No scattered and confluent foci of T2 hyperintensity are seen within the white matter of the cerebral hemispheres and within the pons, nonspecific, most likely related to chronic small vessel ischemia. Hemorrhage, hydrocephalus, extra-axial collection or mass lesion. Vascular: Normal flow voids. Skull and upper cervical spine:  Normal marrow signal. Sinuses/Orbits: Negative. Other: None. IMPRESSION: 1. Small cortical focus of restricted diffusion within the right frontal lobe consistent with acute/subacute infarct. 2. Moderate chronic small vessel ischemic changes of the white matter. These results will be called to the ordering clinician or representative by the Radiologist Assistant, and communication documented in the PACS or [employer]. Electronically Signed   By: Pedro Earls M.D.   On: 06/11/2021 11:01   IR Angiogram Visceral Selective  Result Date: 06/08/2021 INDICATION: Upper GI bleeding  secondary to duodenal ulcer. Please perform mesenteric arteriogram and percutaneous embolization as indicated. EXAM: 1. ULTRASOUND GUIDANCE FOR ARTERIAL ACCESS 2. SELECTIVE CELIAC ARTERIOGRAM 3. SELECTIVE COMMON HEPATIC ARTERIOGRAM 4. SELECTIVE GASTRODUODENAL ARTERIOGRAM AND PERCUTANEOUS COIL EMBOLIZATION COMPARISON:  CTA abdomen and pelvis-earlier same day MEDICATIONS: None ANESTHESIA/SEDATION: Moderate (conscious) sedation was employed during this procedure. A total of Versed 1 mg and Fentanyl 50 mcg was administered intravenously. Moderate Sedation Time: 59 minutes. The patient's level of consciousness and vital signs were monitored continuously by radiology nursing throughout the procedure under my direct supervision. CONTRAST:  100 cc Omnipaque 300 FLUOROSCOPY TIME:  19 minutes, 18 seconds (354 mGy) COMPLICATIONS: None immediate. PROCEDURE: Informed consent was obtained from the patient following explanation of the procedure, risks, benefits and alternatives. All questions were addressed. A time out was performed prior to the initiation of the procedure. Maximal barrier sterile technique utilized including caps, mask, sterile gowns, sterile gloves, large sterile drape, hand hygiene, and Betadine prep. The right femoral head was marked fluoroscopically. Under sterile conditions and local anesthesia, the right common  femoral artery access was performed with a micropuncture needle. Under direct ultrasound guidance, the right common femoral was accessed with a micropuncture kit. An ultrasound image was saved for documentation purposes. This allowed for placement of a 5-French vascular sheath. A limited arteriogram was performed through the side arm of the sheath confirming appropriate access within the right common femoral artery. Over a Bentson wire, a Mickelson catheter was advanced the caudal aspect of the thoracic aorta where was reformed, back bled and flushed. The Mickelson catheter was then utilized to select the celiac artery and a selective celiac arteriogram was performed. With the use of a fathom 14 microwire, a regular Renegade microcatheter was advanced to the level of the gastroduodenal artery and a selective gastroduodenal arteriogram was performed. Next, the gastroduodenal artery was percutaneously coil embolized with multiple overlapping 5 mm, 6 mm, 8 mm and 10 mm diameter interlock coils to near the vessel's origin. Multiple selective arteriograms were performed during coil embolization. The microcatheter was retracted to the level of the common hepatic artery and a post embolization selective common hepatic arteriogram was performed. The microcatheter was removed and a completion celiac arteriogram was performed through the [facility] catheter. Images were reviewed and the procedure was terminated. All wires, catheters and sheaths were removed from the patient. Hemostasis was achieved at the right groin access site with deployment of an ExoSeal closure device and manual compression. A dressing was applied. The patient tolerated the procedure well without immediate post procedural complication. FINDINGS: Selective celiac arteriogram redemonstrates a replaced left hepatic artery arising from the left gastric artery. 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 Selective gastroduodenal arteriogram demonstrates an expected branching pattern giving rise to several pancreaticoduodenal arteries supplying the duodenal bulb descending portion of the duodenum. The GDA terminates in a hypertrophied right gastroepiploic artery. While a discrete area contrast extravasation was not identified, the GDA was prophylactically percutaneously coil embolized to near its origin given the endoscopic confirmation of the duodenal ulcer and definitive contrast extravasation demonstrated on preceding CTA. Completion selective common hepatic arteriogram and celiac arteriogram demonstrates a technically excellent results with occlusion of the GDA. IMPRESSION: 1. Technically successful prophylactic percutaneous coil embolization of the GDA secondary to bleeding duodenal ulcer demonstrated on preceding endoscopy and CTA. 2. 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 PLAN: - The patient is to remain flat for 4 hours with right leg straight. - As the patient was bleeding at the time of preceding CTA, he will likely experience several bloody/melanotic bowel movements and may require additional resuscitation, however ultimately I am hopeful he will stabilize in the coming days. - Repeat endoscopy may be performed at the discretion of the GI service as indicated. Electronically Signed   By: Sandi Mariscal M.D.   On: 06/08/2021 08:04   IR Angiogram Selective Each Additional Vessel  Result Date: 06/08/2021 INDICATION: Upper GI bleeding secondary to duodenal ulcer. Please perform mesenteric arteriogram and percutaneous embolization as indicated. EXAM: 1. ULTRASOUND GUIDANCE FOR ARTERIAL ACCESS 2. SELECTIVE CELIAC ARTERIOGRAM 3. SELECTIVE COMMON HEPATIC ARTERIOGRAM 4. SELECTIVE GASTRODUODENAL ARTERIOGRAM AND PERCUTANEOUS COIL EMBOLIZATION COMPARISON:  CTA abdomen  and pelvis-earlier same day MEDICATIONS: None ANESTHESIA/SEDATION: Moderate (conscious) sedation was employed during this procedure. A total of Versed 1 mg and Fentanyl 50 mcg was administered intravenously. Moderate Sedation Time: 59 minutes. The patient's level of consciousness and vital signs were monitored continuously by radiology nursing throughout the procedure under my direct supervision. CONTRAST:  100 cc Omnipaque 300 FLUOROSCOPY TIME:  19 minutes, 18 seconds (161 mGy) COMPLICATIONS: None immediate. PROCEDURE: Informed consent was obtained from the patient following explanation of the procedure, risks, benefits and alternatives. All questions were addressed. A time out was performed prior to the initiation of the procedure. Maximal barrier sterile technique utilized including caps, mask, sterile gowns, sterile gloves, large sterile drape, hand hygiene, and Betadine prep. The right femoral head was marked fluoroscopically. Under sterile conditions and local anesthesia, the right common femoral artery access was performed with a micropuncture needle. Under direct ultrasound guidance, the right common femoral was accessed with a micropuncture kit. An ultrasound image was saved for documentation purposes. This allowed for placement of a 5-French vascular sheath. A limited arteriogram was performed through the side arm of the sheath confirming appropriate access within the right common femoral artery. Over a Bentson wire, a Mickelson catheter was advanced the caudal aspect of the thoracic aorta where was reformed, back bled and flushed. The Mickelson catheter was then utilized to select the celiac artery and a selective celiac arteriogram was performed. With the use of a fathom 14 microwire, a regular Renegade microcatheter was advanced to the level of the gastroduodenal artery and a selective gastroduodenal arteriogram was performed. Next, the gastroduodenal artery was percutaneously coil embolized with  multiple overlapping 5 mm, 6 mm, 8 mm and 10 mm diameter interlock coils to near the vessel's origin. Multiple selective arteriograms were performed during coil embolization. The microcatheter was retracted to the level of the common hepatic artery and a post embolization selective common hepatic arteriogram was performed. The microcatheter was removed and a completion celiac arteriogram was performed through the [facility] catheter. Images were reviewed and the procedure was terminated. All wires, catheters and sheaths were removed from the patient. Hemostasis was achieved at the right groin access site with deployment of an ExoSeal closure device and manual compression. A dressing was applied. The patient tolerated the procedure well without immediate post procedural complication. FINDINGS: Selective celiac arteriogram redemonstrates a replaced left hepatic artery arising from the left gastric artery. 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 Selective gastroduodenal arteriogram demonstrates an expected branching pattern giving rise to several pancreaticoduodenal arteries supplying the duodenal bulb descending portion of the duodenum.  The GDA terminates in a hypertrophied right gastroepiploic artery. While a discrete area contrast extravasation was not identified, the GDA was prophylactically percutaneously coil embolized to near its origin given the endoscopic confirmation of the duodenal ulcer and definitive contrast extravasation demonstrated on preceding CTA. Completion selective common hepatic arteriogram and celiac arteriogram demonstrates a technically excellent results with occlusion of the GDA. IMPRESSION: 1. Technically successful prophylactic percutaneous coil embolization of the GDA secondary to bleeding duodenal ulcer demonstrated on preceding endoscopy and CTA. 2. Incidentally noted contained penetrating  atherosclerotic ulcer arising from the proximal aspect of the common hepatic artery as demonstrated on preceding CTA. PLAN: - The patient is to remain flat for 4 hours with right leg straight. - As the patient was bleeding at the time of preceding CTA, he will likely experience several bloody/melanotic bowel movements and may require additional resuscitation, however ultimately I am hopeful he will stabilize in the coming days. - Repeat endoscopy may be performed at the discretion of the GI service as indicated. Electronically Signed   By: Sandi Mariscal M.D.   On: 06/08/2021 08:04   IR Angiogram Follow Up Study  Result Date: 06/08/2021 INDICATION: Upper GI bleeding secondary to duodenal ulcer. Please perform mesenteric arteriogram and percutaneous embolization as indicated. EXAM: 1. ULTRASOUND GUIDANCE FOR ARTERIAL ACCESS 2. SELECTIVE CELIAC ARTERIOGRAM 3. SELECTIVE COMMON HEPATIC ARTERIOGRAM 4. SELECTIVE GASTRODUODENAL ARTERIOGRAM AND PERCUTANEOUS COIL EMBOLIZATION COMPARISON:  CTA abdomen and pelvis-earlier same day MEDICATIONS: None ANESTHESIA/SEDATION: Moderate (conscious) sedation was employed during this procedure. A total of Versed 1 mg and Fentanyl 50 mcg was administered intravenously. Moderate Sedation Time: 59 minutes. The patient's level of consciousness and vital signs were monitored continuously by radiology nursing throughout the procedure under my direct supervision. CONTRAST:  100 cc Omnipaque 300 FLUOROSCOPY TIME:  19 minutes, 18 seconds (465 mGy) COMPLICATIONS: None immediate. PROCEDURE: Informed consent was obtained from the patient following explanation of the procedure, risks, benefits and alternatives. All questions were addressed. A time out was performed prior to the initiation of the procedure. Maximal barrier sterile technique utilized including caps, mask, sterile gowns, sterile gloves, large sterile drape, hand hygiene, and Betadine prep. The right femoral head was marked  fluoroscopically. Under sterile conditions and local anesthesia, the right common femoral artery access was performed with a micropuncture needle. Under direct ultrasound guidance, the right common femoral was accessed with a micropuncture kit. An ultrasound image was saved for documentation purposes. This allowed for placement of a 5-French vascular sheath. A limited arteriogram was performed through the side arm of the sheath confirming appropriate access within the right common femoral artery. Over a Bentson wire, a Mickelson catheter was advanced the caudal aspect of the thoracic aorta where was reformed, back bled and flushed. The Mickelson catheter was then utilized to select the celiac artery and a selective celiac arteriogram was performed. With the use of a fathom 14 microwire, a regular Renegade microcatheter was advanced to the level of the gastroduodenal artery and a selective gastroduodenal arteriogram was performed. Next, the gastroduodenal artery was percutaneously coil embolized with multiple overlapping 5 mm, 6 mm, 8 mm and 10 mm diameter interlock coils to near the vessel's origin. Multiple selective arteriograms were performed during coil embolization. The microcatheter was retracted to the level of the common hepatic artery and a post embolization selective common hepatic arteriogram was performed. The microcatheter was removed and a completion celiac arteriogram was performed through the [facility] catheter. Images were reviewed and the procedure was terminated. All wires,  catheters and sheaths were removed from the patient. Hemostasis was achieved at the right groin access site with deployment of an ExoSeal closure device and manual compression. A dressing was applied. The patient tolerated the procedure well without immediate post procedural complication. FINDINGS: Selective celiac arteriogram redemonstrates a replaced left hepatic artery arising from the left gastric artery. 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 Selective gastroduodenal arteriogram demonstrates an expected branching pattern giving rise to several pancreaticoduodenal arteries supplying the duodenal bulb descending portion of the duodenum. The GDA terminates in a hypertrophied right gastroepiploic artery. While a discrete area contrast extravasation was not identified, the GDA was prophylactically percutaneously coil embolized to near its origin given the endoscopic confirmation of the duodenal ulcer and definitive contrast extravasation demonstrated on preceding CTA. Completion selective common hepatic arteriogram and celiac arteriogram demonstrates a technically excellent results with occlusion of the GDA. IMPRESSION: 1. Technically successful prophylactic percutaneous coil embolization of the GDA secondary to bleeding duodenal ulcer demonstrated on preceding endoscopy and CTA. 2. 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 PLAN: - The patient is to remain flat for 4 hours with right leg straight. - As the patient was bleeding at the time of preceding CTA, he will likely experience several bloody/melanotic bowel movements and may require additional resuscitation, however ultimately I am hopeful he will stabilize in the coming days. - Repeat endoscopy may be performed at the discretion of the GI service as indicated. Electronically Signed   By: Sandi Mariscal M.D.   On: 06/08/2021 08:04   IR US Guide Vasc Access Right  Result Date: 06/08/2021 INDICATION: Upper GI bleeding secondary to duodenal ulcer. Please perform mesenteric arteriogram and percutaneous embolization as indicated. EXAM: 1. ULTRASOUND GUIDANCE FOR ARTERIAL ACCESS 2. SELECTIVE CELIAC ARTERIOGRAM 3. SELECTIVE COMMON HEPATIC ARTERIOGRAM 4. SELECTIVE GASTRODUODENAL  ARTERIOGRAM AND PERCUTANEOUS COIL EMBOLIZATION COMPARISON:  CTA abdomen and pelvis-earlier same day MEDICATIONS: None ANESTHESIA/SEDATION: Moderate (conscious) sedation was employed during this procedure. A total of Versed 1 mg and Fentanyl 50 mcg was administered intravenously. Moderate Sedation Time: 59 minutes. The patient's level of consciousness and vital signs were monitored continuously by radiology nursing throughout the procedure under my direct supervision. CONTRAST:  100 cc Omnipaque 300 FLUOROSCOPY TIME:  19 minutes, 18 seconds (482 mGy) COMPLICATIONS: None immediate. PROCEDURE: Informed consent was obtained from the patient following explanation of the procedure, risks, benefits and alternatives. All questions were addressed. A time out was performed prior to the initiation of the procedure. Maximal barrier sterile technique utilized including caps, mask, sterile gowns, sterile gloves, large sterile drape, hand hygiene, and Betadine prep. The right femoral head was marked fluoroscopically. Under sterile conditions and local anesthesia, the right common femoral artery access was performed with a micropuncture needle. Under direct ultrasound guidance, the right common femoral was accessed with a micropuncture kit. An ultrasound image was saved for documentation purposes. This allowed for placement of a 5-French vascular sheath. A limited arteriogram was performed through the side arm of the sheath confirming appropriate access within the right common femoral artery. Over a Bentson wire, a Mickelson catheter was advanced the caudal aspect of the thoracic aorta where was reformed, back bled and flushed. The Mickelson catheter was then utilized to select the celiac artery and a selective celiac arteriogram was performed. With the use of a fathom 14 microwire, a  regular Renegade microcatheter was advanced to the level of the gastroduodenal artery and a selective gastroduodenal arteriogram was performed. Next,  the gastroduodenal artery was percutaneously coil embolized with multiple overlapping 5 mm, 6 mm, 8 mm and 10 mm diameter interlock coils to near the vessel's origin. Multiple selective arteriograms were performed during coil embolization. The microcatheter was retracted to the level of the common hepatic artery and a post embolization selective common hepatic arteriogram was performed. The microcatheter was removed and a completion celiac arteriogram was performed through the [facility] catheter. Images were reviewed and the procedure was terminated. All wires, catheters and sheaths were removed from the patient. Hemostasis was achieved at the right groin access site with deployment of an ExoSeal closure device and manual compression. A dressing was applied. The patient tolerated the procedure well without immediate post procedural complication. FINDINGS: Selective celiac arteriogram redemonstrates a replaced left hepatic artery arising from the left gastric artery. 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 Selective gastroduodenal arteriogram demonstrates an expected branching pattern giving rise to several pancreaticoduodenal arteries supplying the duodenal bulb descending portion of the duodenum. The GDA terminates in a hypertrophied right gastroepiploic artery. While a discrete area contrast extravasation was not identified, the GDA was prophylactically percutaneously coil embolized to near its origin given the endoscopic confirmation of the duodenal ulcer and definitive contrast extravasation demonstrated on preceding CTA. Completion selective common hepatic arteriogram and celiac arteriogram demonstrates a technically excellent results with occlusion of the GDA. IMPRESSION: 1. Technically successful prophylactic percutaneous coil embolization of the GDA secondary to bleeding duodenal ulcer demonstrated on preceding  endoscopy and CTA. 2. 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 PLAN: - The patient is to remain flat for 4 hours with right leg straight. - As the patient was bleeding at the time of preceding CTA, he will likely experience several bloody/melanotic bowel movements and may require additional resuscitation, however ultimately I am hopeful he will stabilize in the coming days. - Repeat endoscopy may be performed at the discretion of the GI service as indicated. Electronically Signed   By: Sandi Mariscal M.D.   On: 06/08/2021 08:04   DG Chest Portable 1 View  Result Date: 06/05/2021 CLINICAL DATA:  GI bleeding EXAM: PORTABLE CHEST 1 VIEW COMPARISON:  10/10/2019 FINDINGS: The heart size and mediastinal contours are within normal limits. Emphysema. Both lungs are clear. The visualized skeletal structures are unremarkable. IMPRESSION: No acute disease. COPD. Electronically Signed   By: Monte Fantasia M.D.   On: 06/05/2021 05:02   IR EMBO ART  VEN HEMORR LYMPH EXTRAV  INC GUIDE ROADMAPPING  Result Date: 06/08/2021 INDICATION: Upper GI bleeding secondary to duodenal ulcer. Please perform mesenteric arteriogram and percutaneous embolization as indicated. EXAM: 1. ULTRASOUND GUIDANCE FOR ARTERIAL ACCESS 2. SELECTIVE CELIAC ARTERIOGRAM 3. SELECTIVE COMMON HEPATIC ARTERIOGRAM 4. SELECTIVE GASTRODUODENAL ARTERIOGRAM AND PERCUTANEOUS COIL EMBOLIZATION COMPARISON:  CTA abdomen and pelvis-earlier same day MEDICATIONS: None ANESTHESIA/SEDATION: Moderate (conscious) sedation was employed during this procedure. A total of Versed 1 mg and Fentanyl 50 mcg was administered intravenously. Moderate Sedation Time: 59 minutes. The patient's level of consciousness and vital signs were monitored continuously by radiology nursing throughout the procedure under my direct supervision. CONTRAST:  100 cc Omnipaque 300  FLUOROSCOPY TIME:  19 minutes, 18 seconds (280 mGy) COMPLICATIONS: None immediate. PROCEDURE: Informed consent was obtained from  the patient following explanation of the procedure, risks, benefits and alternatives. All questions were addressed. A time out was performed prior to the initiation of the procedure. Maximal barrier sterile technique utilized including caps, mask, sterile gowns, sterile gloves, large sterile drape, hand hygiene, and Betadine prep. The right femoral head was marked fluoroscopically. Under sterile conditions and local anesthesia, the right common femoral artery access was performed with a micropuncture needle. Under direct ultrasound guidance, the right common femoral was accessed with a micropuncture kit. An ultrasound image was saved for documentation purposes. This allowed for placement of a 5-French vascular sheath. A limited arteriogram was performed through the side arm of the sheath confirming appropriate access within the right common femoral artery. Over a Bentson wire, a Mickelson catheter was advanced the caudal aspect of the thoracic aorta where was reformed, back bled and flushed. The Mickelson catheter was then utilized to select the celiac artery and a selective celiac arteriogram was performed. With the use of a fathom 14 microwire, a regular Renegade microcatheter was advanced to the level of the gastroduodenal artery and a selective gastroduodenal arteriogram was performed. Next, the gastroduodenal artery was percutaneously coil embolized with multiple overlapping 5 mm, 6 mm, 8 mm and 10 mm diameter interlock coils to near the vessel's origin. Multiple selective arteriograms were performed during coil embolization. The microcatheter was retracted to the level of the common hepatic artery and a post embolization selective common hepatic arteriogram was performed. The microcatheter was removed and a completion celiac arteriogram was performed through the [facility] catheter.  Images were reviewed and the procedure was terminated. All wires, catheters and sheaths were removed from the patient. Hemostasis was achieved at the right groin access site with deployment of an ExoSeal closure device and manual compression. A dressing was applied. The patient tolerated the procedure well without immediate post procedural complication. FINDINGS: Selective celiac arteriogram redemonstrates a replaced left hepatic artery arising from the left gastric artery. 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 Selective gastroduodenal arteriogram demonstrates an expected branching pattern giving rise to several pancreaticoduodenal arteries supplying the duodenal bulb descending portion of the duodenum. The GDA terminates in a hypertrophied right gastroepiploic artery. While a discrete area contrast extravasation was not identified, the GDA was prophylactically percutaneously coil embolized to near its origin given the endoscopic confirmation of the duodenal ulcer and definitive contrast extravasation demonstrated on preceding CTA. Completion selective common hepatic arteriogram and celiac arteriogram demonstrates a technically excellent results with occlusion of the GDA. IMPRESSION: 1. Technically successful prophylactic percutaneous coil embolization of the GDA secondary to bleeding duodenal ulcer demonstrated on preceding endoscopy and CTA. 2. 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 PLAN: - The patient is to remain flat for 4 hours with right leg straight. - As the patient was bleeding at the time of preceding CTA, he will likely experience several bloody/melanotic bowel movements and may require additional resuscitation, however ultimately I am hopeful he will stabilize in the coming days. - Repeat  endoscopy may be performed at the discretion of the GI service as indicated. Electronically Signed   By: Sandi Mariscal M.D.   On: 06/08/2021 08:04   CT Angio Abd/Pel w/ and/or w/o  Result Date: 06/07/2021 CLINICAL DATA:  GI bleeding. EXAM: CTA ABDOMEN AND PELVIS WITHOUT AND WITH CONTRAST TECHNIQUE: Multidetector CT imaging of the abdomen  and pelvis was performed using the standard protocol during bolus administration of intravenous contrast. Multiplanar reconstructed images and MIPs were obtained and reviewed to evaluate the vascular anatomy. CONTRAST:  135m OMNIPAQUE IOHEXOL 350 MG/ML SOLN COMPARISON:  CT the chest, abdomen pelvis-10/10/2019 FINDINGS: VASCULAR Aorta: Scattered moderate amount of predominantly calcified atherosclerotic plaque within a mildly tortuous but normal caliber abdominal aorta, not resulting in a hemodynamically significant stenosis. No evidence of abdominal aortic dissection or periaortic stranding. Celiac: Widely patent without hemodynamically significant narrowing. There is an approximately 0.7 x 0.6 cm contained penetrating atherosclerotic ulcer involving the proximal aspect of the common hepatic artery (coronal image 42, series 9), with associated non flow limiting intimal web. The left gastric artery is noted to give rise to a replaced left hepatic artery. The exact etiology of the gastric antral and duodenal bleeding is not definitely identified though presumably secondary to a small pancreaticoduodenal branch arising from the GDA. SMA: There is a minimal amount of calcified atherosclerotic plaque involving the origin of this SMA, not resulting in hemodynamically significant stenosis. Conventional branching pattern. The distal tributaries the SMA appear widely patent without discrete intraluminal filling defect to suggest distal embolism. Renals: There is a tiny accessory right renal artery which supplies the inferior pole of the right kidney. Solitary left renal artery. There is a  minimal amount of mixed calcified and noncalcified atherosclerotic plaque involving the origin of left renal artery, not resulting in a hemodynamically significant stenosis. No vessel irregularity to suggest FMD. IMA: Remains patent. Inflow: There is a minimal amount of eccentric predominantly calcified atherosclerotic plaque involving the bilateral common iliac arteries, not resulting in hemodynamically significant stenosis. The bilateral internal iliac arteries are disease though patent and of normal caliber. The bilateral external iliac arteries are of normal caliber and widely patent without a hemodynamically significant narrowing. Proximal Outflow: There is a moderate amount of eccentric calcified and noncalcified atherosclerotic plaque involving the bilateral common femoral arteries, not resulting in a hemodynamically significant stenosis. The imaged portions of the bilateral deep and superficial femoral arteries are normal caliber and widely patent without hemodynamically significant narrowing. Veins: The IVC and pelvic venous systems appear widely patent. Review of the MIP images confirms the above findings. _________________________________________________________ NON-VASCULAR Lower chest: Limited visualization of the lower thorax demonstrates trace bilateral pleural effusions with associated bibasilar consolidative opacities. Normal heart size.  No pericardial effusion. Hepatobiliary: Normal hepatic contour. Redemonstrated punctate cysts within the dome of the liver, similar to the 09/2019 examination. Additional subcentimeter hypoattenuating hepatic lesions are too small to adequately characterize though favored to represent additional hepatic cysts. No discrete worrisome hepatic lesions. Normal appearance of the gallbladder given degree distention. No radiopaque gallstones. No intra or extrahepatic biliary duct dilatation. No ascites. Pancreas: Normal appearance of the pancreas. Spleen: Normal appearance  of the spleen. Adrenals/Urinary Tract: Nonobstructing renal stones are seen bilaterally right significantly greater than left including a dominant nonobstructing layering stone within the posteroinferior aspect of the right kidney measuring 1.8 x 0.9 cm (image 35, series 6, morphologically similar to the 09/2019 examination. Bilateral subcentimeter hypoattenuating renal lesions are too small to adequately characterize though favored to represent renal cysts. No urinary obstruction or perinephric stranding. Normal appearance the bilateral adrenal glands. There is minimal mass effect of the prostate gland upon the undersurface of the urinary bladder. Otherwise, normal appearance of the urinary bladder given degree of distention. Stomach/Bowel: There is discrete intraluminal contrast extravasation at the level of the gastric antrum, duodenal bulb and descending portion of  the duodenum (axial images 43 through 57, series 7; coronal images 36 through 48, series 9), likely compatible with provided history of duodenal ulcer and the etiology of reported history of GI bleeding. Circumferential bowel wall thickening involving several loops of mid small bowel within the left mid hemiabdomen (representative images 23 through 30, series 10), not resulting in enteric obstruction, likely reactive in etiology given upstream GI bleeding. Scattered colonic diverticulosis without evidence of superimposed acute diverticulitis. No significant hiatal hernia. No pneumoperitoneum, pneumatosis or portal venous gas. Lymphatic: No bulky retroperitoneal, mesenteric, pelvic or inguinal lymphadenopathy. Reproductive: Dystrophic calcifications within a mildly enlarged prostate gland which has minimal mass effect on the undersurface of the urinary bladder. Other: Mild diffuse body wall anasarca, most conspicuous about the lateral aspects of the proximal thighs bilaterally. Musculoskeletal: No acute or aggressive osseous abnormalities.  Mild-to-moderate multilevel lumbar spine DDD, worse at L4-L5 and L5-S1 with disc space height loss, endplate irregularity and sclerosis. IMPRESSION: 1. Examination is positive for acute intraluminal contrast at the level of the gastric antrum and duodenal bulb, presumably secondary to reported history of duodenal ulcer. The exact etiology of the gastric antral and duodenal bleeding is not definitely identified though presumably secondary to a small pancreaticoduodenal branch arising from the GDA. 2. Scattered atherosclerotic plaque within normal caliber abdominal aorta, not resulting in a hemodynamically significant stenosis. Aortic Atherosclerosis (ICD10-I70.0). 3. 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 4. Non conventional mesenteric anatomy as above. Critical Value/emergent results were called by telephone at the time of interpretation on 06/07/2021 at 4:17 pm to provider Salley Slaughter , who verbally acknowledged these results. Electronically Signed   By: Sandi Mariscal M.D.   On: 06/07/2021 16:26    Labs: BNP (last 3 results) No results for input(s): BNP in the last 8760 hours. Basic Metabolic Panel: Recent Labs  Lab 06/05/21 0321 06/07/21 0323 06/08/21 0101  NA 139 135 137  K 4.0 4.0 3.7  CL 103 95* 95*  CO2 31 38* 37*  GLUCOSE 90 101* 100*  BUN 22 19 25*  CREATININE 0.77 0.64 0.62  CALCIUM 7.9* 8.2* 8.3*  MG  --  1.6*  --   PHOS  --  2.0*  --    Liver Function Tests: Recent Labs  Lab 06/05/21 0321  AST 15  ALT 10  ALKPHOS 41  BILITOT 0.5  PROT 4.8*  ALBUMIN 2.7*   No results for input(s): LIPASE, AMYLASE in the last 168 hours. No results for input(s): AMMONIA in the last 168 hours. CBC: Recent Labs  Lab 06/05/21 0321 06/05/21 1529 06/06/21 0810 06/07/21 0323 06/08/21 0101 06/09/21 0853 06/10/21 0207  WBC 10.5  --  8.4 8.9 9.6  --  7.6   NEUTROABS 7.5  --   --   --   --   --   --   HGB 12.9*   < > 12.1* 12.1* 11.3* 9.9* 10.0*  HCT 40.5   < > 39.2 37.7* 35.1* 30.2* 30.0*  MCV 96.9  --  98.0 95.2 93.6  --  91.7  PLT 240  --  193 194 199  --  198   < > = values in this interval not displayed.   Cardiac Enzymes: No results for input(s): CKTOTAL, CKMB, CKMBINDEX, TROPONINI in the last 168 hours. BNP: Invalid input(s): POCBNP CBG: No results for input(s): GLUCAP in the last 168 hours. D-Dimer No results for input(s): DDIMER in  the last 72 hours. Hgb A1c Recent Labs    06/10/21 1654  HGBA1C 5.7*   Lipid Profile Recent Labs    06/10/21 1654  CHOL 122  HDL 35*  LDLCALC 77  TRIG 50  CHOLHDL 3.5   Thyroid function studies No results for input(s): TSH, T4TOTAL, T3FREE, THYROIDAB in the last 72 hours.  Invalid input(s): FREET3 Anemia work up No results for input(s): VITAMINB12, FOLATE, FERRITIN, TIBC, IRON, RETICCTPCT in the last 72 hours. Urinalysis    Component Value Date/Time   COLORURINE YELLOW 10/10/2019 0909   APPEARANCEUR HAZY (A) 10/10/2019 0909   LABSPEC 1.012 10/10/2019 0909   PHURINE 7.0 10/10/2019 0909   GLUCOSEU NEGATIVE 10/10/2019 0909   HGBUR MODERATE (A) 10/10/2019 0909   BILIRUBINUR NEGATIVE 10/10/2019 0909   KETONESUR NEGATIVE 10/10/2019 0909   PROTEINUR 30 (A) 10/10/2019 0909   NITRITE NEGATIVE 10/10/2019 0909   LEUKOCYTESUR SMALL (A) 10/10/2019 0909   Sepsis Labs Invalid input(s): PROCALCITONIN,  WBC,  LACTICIDVEN Microbiology Recent Results (from the past 240 hour(s))  Resp Panel by RT-PCR (Flu A&B, Covid) Nasopharyngeal Swab     Status: None   Collection Time: 06/05/21  3:21 AM   Specimen: Nasopharyngeal Swab; Nasopharyngeal(NP) swabs in vial transport medium  Result Value Ref Range Status   SARS Coronavirus 2 by RT PCR NEGATIVE NEGATIVE Final    Comment: (NOTE) SARS-CoV-2 target nucleic acids are NOT DETECTED.  The SARS-CoV-2 RNA is generally detectable in upper  respiratory specimens during the acute phase of infection. The lowest concentration of SARS-CoV-2 viral copies this assay can detect is 138 copies/mL. A negative result does not preclude SARS-Cov-2 infection and should not be used as the sole basis for treatment or other patient management decisions. A negative result may occur with  improper specimen collection/handling, submission of specimen other than nasopharyngeal swab, presence of viral mutation(s) within the areas targeted by this assay, and inadequate number of viral copies(<138 copies/mL). A negative result must be combined with clinical observations, patient history, and epidemiological information. The expected result is Negative.  Fact Sheet for Patients:  EntrepreneurPulse.com.au  Fact Sheet for Healthcare Providers:  IncredibleEmployment.be  This test is no t yet approved or cleared by the [place] FDA and  has been authorized for detection and/or diagnosis of SARS-CoV-2 by FDA under an Emergency Use Authorization (EUA). This EUA will remain  in effect (meaning this test can be used) for the duration of the COVID-19 declaration under Section 564(b)(1) of the Act, 21 U.S.C.section 360bbb-3(b)(1), unless the authorization is terminated  or revoked sooner.       Influenza A by PCR NEGATIVE NEGATIVE Final   Influenza B by PCR NEGATIVE NEGATIVE Final    Comment: (NOTE) The Xpert Xpress SARS-CoV-2/FLU/RSV plus assay is intended as an aid in the diagnosis of influenza from Nasopharyngeal swab specimens and should not be used as a sole basis for treatment. Nasal washings and aspirates are unacceptable for Xpert Xpress SARS-CoV-2/FLU/RSV testing.  Fact Sheet for Patients: EntrepreneurPulse.com.au  Fact Sheet for Healthcare Providers: IncredibleEmployment.be  This test is not yet approved or cleared by the [place] FDA and has been  authorized for detection and/or diagnosis of SARS-CoV-2 by FDA under an Emergency Use Authorization (EUA). This EUA will remain in effect (meaning this test can be used) for the duration of the COVID-19 declaration under Section 564(b)(1) of the Act, 21 U.S.C. section 360bbb-3(b)(1), unless the authorization is terminated or revoked.  Performed at [facility], [place]  [address], [place], [place] [address]      Time coordinating discharge: 25 minutes  SIGNED: Antonieta Pert, MD  [facility] 06/11/2021, 1:15 PM  If 7PM-7AM, please contact night-coverage [email or web address]

## 2021-06-10 NOTE — Progress Notes (Signed)
WHILE TALKING with wife little after noon patient could not express some words and was saying non sense  words, wife got concerned and informed the nursing station.I was informed at 2:49,I came and examined him, patient says his tongue got tied for 10 minutes. On exam AAX3,non focal.I called his wife and spoke to her, this was unsual for him to have and she was concerned about stroke as his father had stroke and his older brother had stroke. I paged neurology to discuss. Received call back and neuro will see him.

## 2021-06-11 ENCOUNTER — Inpatient Hospital Stay (HOSPITAL_COMMUNITY): Payer: Medicare PPO

## 2021-06-11 DIAGNOSIS — I6389 Other cerebral infarction: Secondary | ICD-10-CM | POA: Diagnosis not present

## 2021-06-11 DIAGNOSIS — K922 Gastrointestinal hemorrhage, unspecified: Secondary | ICD-10-CM | POA: Diagnosis not present

## 2021-06-11 DIAGNOSIS — I639 Cerebral infarction, unspecified: Secondary | ICD-10-CM

## 2021-06-11 DIAGNOSIS — D62 Acute posthemorrhagic anemia: Secondary | ICD-10-CM | POA: Diagnosis not present

## 2021-06-11 DIAGNOSIS — I63413 Cerebral infarction due to embolism of bilateral middle cerebral arteries: Secondary | ICD-10-CM | POA: Diagnosis not present

## 2021-06-11 DIAGNOSIS — I6340 Cerebral infarction due to embolism of unspecified cerebral artery: Secondary | ICD-10-CM | POA: Insufficient documentation

## 2021-06-11 LAB — ECHOCARDIOGRAM COMPLETE
Area-P 1/2: 4.45 cm2
Height: 70 in
S' Lateral: 3.3 cm
Weight: 2560 oz

## 2021-06-11 LAB — HEMOGLOBIN A1C
Hgb A1c MFr Bld: 5.7 % — ABNORMAL HIGH (ref 4.8–5.6)
Mean Plasma Glucose: 117 mg/dL

## 2021-06-11 MED ORDER — IOHEXOL 350 MG/ML SOLN
80.0000 mL | Freq: Once | INTRAVENOUS | Status: AC | PRN
  Administered 2021-06-11: 80 mL via INTRAVENOUS

## 2021-06-11 NOTE — Progress Notes (Signed)
Call from radiology with report. MRI brain shows small focus in right frontal lobe consistent with acute/subacute infarct. On call provider paged.

## 2021-06-11 NOTE — Care Management (Signed)
1905 06-11-21 Patient is approved for Trilogy Vent via [employer]. Plan was for discharge home tonight and the patient feels he should transition in the morning. [employer] is aware that the patient will d/c Saturday. [employer] will arrange Trilogy delivery with the wife. Staff RN to do an ambulatory sat since patient has been on one liter of oxygen throughout the day to see if qualifies for home oxygen. Will need orders for 02 if the patient qualifies. Weekend Case Manager please follow for home 02 needs and please make [employer] aware when patient has d/c orders for transition home for trilogy delivery. No further needs from Case Manager at this time.

## 2021-06-11 NOTE — Evaluation (Signed)
Occupational Therapy Evaluation Patient Details Name: Martin Mendez MRN: [id number] DOB: [birth year] Today's Date: 06/11/2021    History of Present Illness 75 y.o. M admitted to [facility] for an upper GI bleed on 06/05/21. On7/28, pt was preparing to d/c when his wife noticied that he talking incoherently and mildly confused. MRI/CT pending. PMH significant for COPD and PTSD.   Clinical Impression   PT admitted for concerns listed above. PTA pt reported independence with all ADL"s and IADL's, including driving, hunting, and yard work. At this time, pt was assessed for potential TIA/CVA symptoms, which are not present at this time. PT continues to present with independence for all ADL's and functional mobility, using no AD. Acute OT will sign off at this time.     Follow Up Recommendations  No OT follow up    Equipment Recommendations  None recommended by OT    Recommendations for Other Services       Precautions / Restrictions Precautions Precautions: None Restrictions Weight Bearing Restrictions: No      Mobility Bed Mobility Overal bed mobility: Independent                  Transfers Overall transfer level: Independent Equipment used: None             General transfer comment: sit<>stand from bed in lowest setting and standard toilet.    Balance Overall balance assessment: No apparent balance deficits (not formally assessed)                                         ADL either performed or assessed with clinical judgement   ADL Overall ADL's : Independent;At baseline                                       General ADL Comments: Pt able to demonstrate good ROM and strength, as well as stable dynamic and static standing balance with no safety concerns. Able to complete all ADL's with no assist, safely.     Vision Baseline Vision/History: Wears glasses Wears Glasses: At all times Patient Visual Report: No change from  baseline Vision Assessment?: No apparent visual deficits Additional Comments: Assessed pt's tracking, peripheral vision, and saccades, no deficits observed.     Perception Perception Perception Tested?: No   Praxis Praxis Praxis tested?: Not tested    Pertinent Vitals/Pain Pain Assessment: No/denies pain     Hand Dominance Right   Extremity/Trunk Assessment Upper Extremity Assessment Upper Extremity Assessment: Overall WFL for tasks assessed   Lower Extremity Assessment Lower Extremity Assessment: Defer to PT evaluation   Cervical / Trunk Assessment Cervical / Trunk Assessment: Normal   Communication Communication Communication: No difficulties   Cognition Arousal/Alertness: Awake/alert Behavior During Therapy: WFL for tasks assessed/performed Overall Cognitive Status: Within Functional Limits for tasks assessed                                     General Comments  VSS on 2L. Attempted to turn off O2 pt satting at 90%, on 1L with ambulation, pt remaining around 91%, RN notified and pt returned to 2L prior to end of session.    Exercises     Shoulder Instructions  Home Living Family/patient expects to be discharged to:: Private residence Living Arrangements: Spouse/significant other Available Help at Discharge: Family;Available 24 hours/day Type of Home: House Home Access: Stairs to enter [employer] of Steps: 4 steps Entrance Stairs-Rails: None Home Layout: One level     Bathroom Shower/Tub: Occupational psychologist: Standard     Home Equipment: None          Prior Functioning/Environment Level of Independence: Independent                 OT Problem List: Decreased strength;Decreased activity tolerance;Impaired balance (sitting and/or standing);Decreased safety awareness;Cardiopulmonary status limiting activity      OT Treatment/Interventions:      OT Goals(Current goals can be found in the care plan  section) Acute Rehab OT Goals Patient Stated Goal: To go home OT Goal Formulation: All assessment and education complete, DC therapy Time For Goal Achievement: 06/11/21 Potential to Achieve Goals: Good  OT Frequency:     Barriers to D/C:            Co-evaluation              AM-PAC OT "6 Clicks" Daily Activity     Outcome Measure Help from another person eating meals?: None Help from another person taking care of personal grooming?: None Help from another person toileting, which includes using toliet, bedpan, or urinal?: None Help from another person bathing (including washing, rinsing, drying)?: None Help from another person to put on and taking off regular upper body clothing?: None Help from another person to put on and taking off regular lower body clothing?: None 6 Click Score: 24   End of Session Equipment Utilized During Treatment: Oxygen Nurse Communication: Mobility status;Other (comment) (O2 sats)  Activity Tolerance: Patient tolerated treatment well Patient left: in bed;with call bell/phone within reach  OT Visit Diagnosis: Unsteadiness on feet (R26.81);Muscle weakness (generalized) (M62.81)                Time: 4481-8563 OT Time Calculation (min): 15 min Charges:  OT General Charges $OT Visit: 1 Visit OT Evaluation $OT Eval Low Complexity: [place]., OTR/L Acute Rehabilitation  Kindrick Lankford [place]ane Yolanda Bonine 06/11/2021, 9:32 AM

## 2021-06-11 NOTE — Progress Notes (Signed)
Bilateral lower extremity venous duplex has been completed. Preliminary results can be found in CV Proc through chart review.   06/11/21 2:50 PM Martin Mendez RVT

## 2021-06-11 NOTE — Evaluation (Signed)
Speech Language Pathology Evaluation Patient Details Name: Martin Mendez MRN: [id number] DOB: Sep 08, 1946 Today's Date: 06/11/2021 Time: 1132-1200 SLP Time Calculation (min) (ACUTE ONLY): 28 min  Problem List:  Patient Active Problem List   Diagnosis Date Noted   Cerebral embolism with cerebral infarction 06/11/2021   Acute GI bleeding 06/05/2021   Acute blood loss anemia 06/05/2021   Syncope 10/10/2019   Epigastric pain 10/10/2019   COPD with acute exacerbation ([place]) 12/13/2018   Acute respiratory failure with hypoxia ([place]) 12/13/2018   Hyponatremia 12/11/2018   Influenza A 12/10/2018   Acute wheezy bronchitis 12/10/2018   Hypoxia 12/10/2018   Smoker 12/10/2018   PTSD (post-traumatic stress disorder) 12/10/2018   Past Medical History:  Past Medical History:  Diagnosis Date   PTSD (post-traumatic stress disorder)    Past Surgical History:  Past Surgical History:  Procedure Laterality Date   ESOPHAGOGASTRODUODENOSCOPY (EGD) WITH PROPOFOL N/A 06/05/2021   Procedure: ESOPHAGOGASTRODUODENOSCOPY (EGD) WITH PROPOFOL;  Surgeon: [name], MD;  Location: [facility];  Service: Endoscopy;  Laterality: N/A;   IR ANGIOGRAM FOLLOW UP STUDY  06/07/2021   IR ANGIOGRAM SELECTIVE EACH ADDITIONAL VESSEL  06/07/2021   IR ANGIOGRAM VISCERAL SELECTIVE  06/07/2021   IR EMBO ART  VEN HEMORR LYMPH EXTRAV  INC GUIDE ROADMAPPING  06/07/2021   IR US GUIDE VASC ACCESS RIGHT  06/07/2021   HPI:  75 y.o. M admitted to [facility] for an upper GI bleed on 06/05/21. On 7/28, pt was preparing to d/c when his wife noticed that he was talking incoherently and mildly confused. MRI revealed small acute/subacute infarct in the R frontal lobe. PMH significant for COPD and PTSD.   Assessment / Plan / Recommendation Clinical Impression  Pt appears to be at his cognitive-linguistic baseline and denies any persistent acute changes. His speech and language are clear, fluent. He scored 25/30 on the SLUMS, which is technically  in the range of mild impairment, but pt believes that this is how he would have scored PTA. Upon reviewing errors in testing, he also acknowledges that some of these errors may have been related to reduced hearing (denies hearing loss but does say he may be having trouble at the moment between SLP wearing a mask and air blowing in his room). He says that he has never performed well on the delayed recall task. He does demonstrate appropriate awareness, recall of hospital events, and reasoning. No further SLP f/u indicated, but did encourage use of precautions and intermittent supervision upon return home for safety.    SLP Assessment  SLP Recommendation/Assessment: Patient does not need any further Speech Lanaguage Pathology Services SLP Visit Diagnosis: Cognitive communication deficit (R41.841)    Follow Up Recommendations  Other (comment) (intermittent supervision)    Frequency and Duration           SLP Evaluation Cognition  Overall Cognitive Status: History of cognitive impairments - at baseline       Comprehension  Auditory Comprehension Overall Auditory Comprehension: Appears within functional limits for tasks assessed    Expression Expression Primary Mode of Expression: Verbal Verbal Expression Overall Verbal Expression: Appears within functional limits for tasks assessed Written Expression Dominant Hand: Right   Oral / Motor  Motor Speech Overall Motor Speech: Appears within functional limits for tasks assessed   GO                    Martin Mendez., M.A. [place] Acute Rehabilitation Services Pager [phone] Office [phone]  06/11/2021, 12:09 PM

## 2021-06-11 NOTE — Progress Notes (Signed)
Patient refused use of CPAP for the evening 

## 2021-06-11 NOTE — Progress Notes (Addendum)
STROKE TEAM PROGRESS NOTE   ATTENDING NOTE: I reviewed above note and agree with the assessment and plan. Pt was seen and examined.   75 year old right-handed male with history of COPD, smoker admitted for upper GI bleeding status post GDA embolization.  Right before discharge yesterday patient had episode of aphasia, difficulty getting words out, patient feeding " tongue thickness", lasted about 5 to 10 minutes and resolved.  MRI showed right frontal small infarct which likely not able to explain patient symptoms.  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  LE venous Doppler negative for DVT.  EF 55 to 60%.  LDL 77, A1c 5.7.  Hemoglobin 10.0, stable, creatinine 0.62.  Patient received 1 unit PRBC transfusion for GI bleeding.  On exam, patient neurologically intact, no focal findings except hard of hearing.  Etiology for patient symptoms concerning for right-sided stroke on MRI and left sided TIA.  Discussed with GI Dr. Paulita Mendez, patient not a candidate for advanced antithrombotic except aspirin 81 at this time.  However, after discussed with patient, he refused aspirin and statin at this time.  Recommend smoking cessation and follow-up with neurology as outpatient for further discussion once GI bleeding more stabilized.  For detailed assessment and plan, please refer to above as I have made changes wherever appropriate.   Neurology will sign off. Please call with questions. Pt will follow up with stroke clinic NP at [facility] in about 4 weeks. Thanks for the consult.   Martin Hawking, MD PhD Stroke Neurology 06/11/2021 11:47 PM    INTERVAL HISTORY His CNA is at the bedside.  He just returned from MRI and we discussed the finding of a small right frontal cortical stroke. I spoke to his wife by phone while in room with pt to update her as well. He has had no return of symptoms.  Vitals:   06/10/21 0845 06/10/21 1242 06/10/21 2014 06/11/21 0457  BP:  93/67 101/77 114/74  Pulse: 91 76 79 80   Resp: 16 17 18 18   Temp:  97.8 F (36.6 C) 98 F (36.7 C) 98.6 F (37 C)  TempSrc:  Oral Oral Oral  SpO2: 96% 100% 98% 99%  Weight:      Height:       CBC:  Recent Labs  Lab 06/05/21 0321 06/05/21 1529 06/08/21 0101 06/09/21 0853 06/10/21 0207  WBC 10.5   < > 9.6  --  7.6  NEUTROABS 7.5  --   --   --   --   HGB 12.9*   < > 11.3* 9.9* 10.0*  HCT 40.5   < > 35.1* 30.2* 30.0*  MCV 96.9   < > 93.6  --  91.7  PLT 240   < > 199  --  198   < > = values in this interval not displayed.   Basic Metabolic Panel:  Recent Labs  Lab 06/07/21 0323 06/08/21 0101  NA 135 137  K 4.0 3.7  CL 95* 95*  CO2 38* 37*  GLUCOSE 101* 100*  BUN 19 25*  CREATININE 0.64 0.62  CALCIUM 8.2* 8.3*  MG 1.6*  --   PHOS 2.0*  --    Lipid Panel:  Recent Labs  Lab 06/10/21 1654  CHOL 122  TRIG 50  HDL 35*  CHOLHDL 3.5  VLDL 10  LDLCALC 77   HgbA1c:  Recent Labs  Lab 06/10/21 1654  HGBA1C 5.7*   Urine Drug Screen: No results for input(s): LABOPIA, COCAINSCRNUR, LABBENZ, AMPHETMU,  THCU, LABBARB in the last 168 hours.  Alcohol Level No results for input(s): ETH in the last 168 hours.  IMAGING past 24 hours No results found.  PHYSICAL EXAM General: Appears well-developed. Psych: Affect appropriate to situation Eyes: No scleral injection HENT: No OP obstrucion Head: Normocephalic.  Cardiovascular: Normal rate and regular rhythm.  Respiratory: Effort normal and breath sounds normal to anterior ascultation GI: Soft.  No distension. There is no tenderness.  Skin: WDI    Neurological Examination Mental Status: Alert, oriented, thought content appropriate.  Speech fluent without evidence of aphasia. Able to follow 3 step commands without difficulty. Cranial Nerves: II: Visual fields grossly normal,  III,IV, VI: ptosis not present, extra-ocular motions intact bilaterally, pupils equal, round, reactive to light and accommodation V,VII: smile symmetric, facial light touch sensation  normal bilaterally VIII: hearing normal bilaterally IX,X: uvula rises symmetrically XI: bilateral shoulder shrug XII: midline tongue extension Motor: Right : Upper extremity   5/5    Left:     Upper extremity   5/5  Lower extremity   5/5     Lower extremity   5/5 Tone and bulk:normal tone throughout; no atrophy noted Sensory: Pinprick and light touch intact throughout, bilaterally Deep Tendon Reflexes: 2+ and symmetric throughout Plantars: Right: downgoing   Left: downgoing Cerebellar: normal finger-to-nose, normal rapid alternating movements and normal heel-to-shin test Gait: normal gait and station   ASSESSMENT/PLAN [name] is a 75 y.o. male with history of COPD with ongoing tobacco smoking, who was admitted on 7/23 for Upper GIB, then was noted to have sudden difficulty speaking that resolved in ~15min. Neurology was consulted for TIA  Stroke:  Small cortical stroke right frontal, appears cardioemboic, stroke work up underway for better etiology CT head No acute abnormality.  CTA head & neck pending MRI  small right frontal cortical stroke 2D Echo pending LDL 77 HgbA1c 5.7 VTE prophylaxis - SCDs    Diet   DIET SOFT Room service appropriate? Yes; Fluid consistency: Thin   none  prior to admission, now on  none d/t GIB ; we d/w primary team and pt/wife about possibly starting 81mg  ASA, but pt does not want to attempt at this time.  Therapy recommendations:  pending Disposition:  pending  Hypertension Home meds:  Inderal 10mg  daily Stable Permissive hypertension (OK if < 220/120) but gradually normalize in 5-7 days Long-term BP goal normotensive  Hyperlipidemia Home meds:  none LDL 77, goal < 70 Pt refuses even a low dose statin, which is acceptable since he is very close to goal High intensity statin not indicated Continue statin at discharge  Diabetes type II no dx HgbA1c 5.7, goal < 7.0 CBGs No results for input(s): GLUCAP in the last 72 hours.   SSI  Other Stroke Risk Factors Advanced Age >/= 47  Cigarette smoker; advised to stop smoking Family hx stroke (father and brother)  Other Active Problems Upper GIB, presented with hemoptysis hgb was 11.3->9.9 COPD on [facility] day # [place], ARNP-C, ANVP-BC Pager: [phone]   To contact Stroke Continuity provider, please refer to [email or web address] After hours, contact General Neurology

## 2021-06-11 NOTE — Progress Notes (Signed)
CT called to confirm patient's refusal of head CT on this morning. Asked patient again if he would be willing to go down for CT. Pt stated I'm already getting an MRI, I don't see the point. On call provider and CT updated.

## 2021-06-11 NOTE — Progress Notes (Signed)
PT Cancellation Note  Patient Details Name: Martin Mendez MRN: [id number] DOB: 05/09/1946   Cancelled Treatment:    Reason Eval/Treat Not Completed: PT screened, no needs identified, will sign off. Per OT and pt, pt mobilizing without difficulty.    [place] 06/11/2021, 12:30 PM Delainey Winstanley [place] Pager [phone] Office [phone]

## 2021-06-11 NOTE — Progress Notes (Signed)
Patient refused the CT scheduled for this evening.  Patient states that he is going for an MRI in the am and that there is no need for a CT scan.  Patient states that he already had one CT while in the hospital this time.  Attempted to call neurologist on call but no response.  Will continue to monitor.   Donah Driver, RN

## 2021-06-11 NOTE — Care Management Important Message (Signed)
Important Message  Patient Details  Name: Martin Mendez MRN: [id number] Date of Birth: Oct 10, 1946   Medicare Important Message Given:  Yes     Memory Argue 06/11/2021, 5:32 PM

## 2021-06-11 NOTE — Progress Notes (Signed)
  Echocardiogram 2D Echocardiogram has been performed.  Darlina Sicilian M 06/11/2021, 2:40 PM

## 2021-06-12 DIAGNOSIS — K922 Gastrointestinal hemorrhage, unspecified: Secondary | ICD-10-CM | POA: Diagnosis not present

## 2021-06-12 NOTE — Progress Notes (Signed)
PROGRESS NOTE    Martin Mendez  FTD:[id number] DOB: 1946/11/10 DOA: 06/05/2021 PCP: Lavone Orn, MD   Chief Complaint  Patient presents with   Emesis   Hypotension   Brief Narrative: 75 year old male with PTSD, tobacco use, history of syncope with unclear cause, presented with hematemesis presyncope and melena and had drop in hemoglobin from 15 to 12 g, positive fecal test, 1 unit PRBC transfused, GI was consulted and patient was admitted.  Patient underwent EGD 7/23 that showed gastritis, duodenitis, nonbleeding duodenal ulcer. Hospital course complicated by acute hypoxic and hypercapnic respiratory failure ABG with pH 7.35 PCO2 80 chest x-ray hyperinflated lungs but patient fairly alert awake oriented not in distress BiPAP has been ordered for the night. Patient had 2 episodes of bright red blood per rectum GI was notified and CT angio was positive subsequently underwent mesenteric arteriogram and percutaneous coil embolization of GDA 7/25 No recurrent GI bleed since, hemoglobin has been stable and improved to 10 g.  Seen by pulmonary advised NIV at home and he is medically stable discharge home BiPAP was arranged by [facility].   Patient had episode where he had slight expressive aphasia underwent neuro work-up found to have acute right small cortical stroke underwent stroke work-up patient refused aspirin and statin at this time and he will follow-up with neurology as outpatient  Subjective: Aaox3, no in distress Did not go home last night as it was late and he was not sure if home bipap is set up.  Assessment & Plan:  Acute GI bleeding: underwent EGD 7/23 that showed gastritis, duodenitis, nonbleeding duodenal ulcer.had 2 episodes of bright red etiology blood per rectum GI was notified and CT angio was positive subsequently underwent mesenteric arteriogram and percutaneous coil embolization of GDA 7/25.  Slightly downtrending hb- keep on PPI twice daily GI recommending H. pylori  treatment x14 days upon discharge-and it has been sent. Tolerating soft diet.  H&H stable no recurrence of bleeding   Acute blood loss anemia: 2/2 #1.status post 1 unit PRBC.  Hemoglobin improved to 10 g.  CBC in 1 week.   Acute respiratory failure with hypoxia and hypercapnia COPD Longstanding smoking history ABG shows chronic CO2 retention secondary to COPD tolerating NIV started on Incruse daily, smoking cessation and arranging home NIV.   PTSD/anxiety mood is stable, cont Celexa Tobacco use disorder continue nicotine patch.  Cessation advised. Hypomagnesemia/Hypophosphatemia  repleted.   Acute right small cortical stroke 06/10/21:patient had transient episode of expressive aphasia seen by Neuro- MRI + w/ small cortical stroke on rt frontal lobe.hba1c 5.7, LDL 77. Neuro discussed w/ with patient about aspirin and statin- he refused it. GI was okay with trying aspirin 81 mg.  Patient will follow up with neurology as outpatient.  CT angio head and neck pending as patient refused to do it last night and if its normal will be discharged home.  Patient's CT angio head and neck no acute finding , echocardiogram unremarkable.  He will follow-up with neurology as outpatient.  Diet Order             Diet - low sodium heart healthy           DIET SOFT Room service appropriate? Yes; Fluid consistency: Thin  Diet effective now                  Patient's Body mass index is 22.96 kg/m.   DVT prophylaxis: SCDs Start: 06/05/21 0536 Code Status:   Code Status: Full Code  Family Communication: plan of care discussed with patient at bedside. Status is: Inpatient  Remains inpatient appropriate because:Inpatient level of care appropriate due to severity of illness  Dispo:  Patient From: Home  Planned Disposition: Home  Medically stable for discharge: Medically stable discharge home today  Unresulted Labs (From admission, onward)    None       Medications reviewed:  Scheduled Meds:   citalopram  20 mg Oral q morning   loratadine  10 mg Oral Daily   nicotine  14 mg Transdermal Daily   pantoprazole  40 mg Oral BID   pneumococcal 23 valent vaccine  0.5 mL Intramuscular [id number]   saline  1 application Each Nare K4M   umeclidinium bromide  1 puff Inhalation Daily   Continuous Infusions: Consultants:see note  Procedures:see note Antimicrobials: Anti-infectives (From admission, onward)    Start     Dose/Rate Route Frequency Ordered Stop   06/10/21 0000  clarithromycin (BIAXIN) 500 MG tablet        500 mg Oral 2 times daily 06/10/21 1100 06/24/21 2359   06/10/21 0000  metroNIDAZOLE (FLAGYL) 500 MG tablet        500 mg Oral 2 times daily 06/10/21 1100 06/24/21 2359      Culture/Microbiology    Component Value Date/Time   SDES URINE, RANDOM 10/10/2019 1614   SPECREQUEST NONE 10/10/2019 1614   CULT  10/10/2019 1614    NO GROWTH Performed at [facility], [place] [address] [place], [place] [address]    REPTSTATUS 10/11/2019 FINAL 10/10/2019 1614    Other culture-see note  Objective: Vitals: Today's Vitals   06/12/21 0453 06/12/21 0853 06/12/21 0900 06/12/21 0907  BP: 108/66  (!) 90/55 (!) 91/54  Pulse: 70     Resp: 18     Temp: 98.3 F (36.8 C)     TempSrc: Oral     SpO2: 98% 98%    Weight:      Height:      PainSc:   0-No pain     Intake/Output Summary (Last 24 hours) at 06/12/2021 1006 Last data filed at 06/12/2021 0900 Gross per 24 hour  Intake 240 ml  Output --  Net 240 ml   Filed Weights   06/05/21 0251 06/05/21 1316  Weight: 72.6 kg 72.6 kg   Weight change:   Intake/Output from previous day: No intake/output data recorded. Intake/Output this shift: Total I/O In: 240 [P.O.:240] Out: -  Filed Weights   06/05/21 0251 06/05/21 1316  Weight: 72.6 kg 72.6 kg   Examination:  General exam: AAO x 3 , older than stated age, weak appearing. HEENT:Oral mucosa moist, Ear/Nose WNL grossly,dentition normal. Respiratory system:  bilaterally diminished, no use of accessory muscle, non tender. Cardiovascular system: S1 & S2 +,No JVD. Gastrointestinal system: Abdomen soft, NT,ND, BS+. Nervous System:Alert, awake, moving extremities Extremities: no edema, distal peripheral pulses palpable.  Skin: No rashes,no icterus. MSK: Normal muscle bulk,tone, power Data Reviewed: I have personally reviewed following labs and imaging studies CBC: Recent Labs  Lab 06/06/21 0810 06/07/21 0323 06/08/21 0101 06/09/21 0853 06/10/21 0207  WBC 8.4 8.9 9.6  --  7.6  HGB 12.1* 12.1* 11.3* 9.9* 10.0*  HCT 39.2 37.7* 35.1* 30.2* 30.0*  MCV 98.0 95.2 93.6  --  91.7  PLT 193 194 199  --  253   Basic Metabolic Panel: Recent Labs  Lab 06/07/21 0323 06/08/21 0101  NA 135 137  K 4.0 3.7  CL 95* 95*  CO2  38* 37*  GLUCOSE 101* 100*  BUN 19 25*  CREATININE 0.64 0.62  CALCIUM 8.2* 8.3*  MG 1.6*  --   PHOS 2.0*  --    GFR: Estimated Creatinine Clearance: 80.7 mL/min (by C-G formula based on SCr of 0.62 mg/dL). Liver Function Tests: No results for input(s): AST, ALT, ALKPHOS, BILITOT, PROT, ALBUMIN in the last 168 hours. No results for input(s): LIPASE, AMYLASE in the last 168 hours. No results for input(s): AMMONIA in the last 168 hours. Coagulation Profile: Recent Labs  Lab 06/07/21 1633  INR 1.1   Cardiac Enzymes: No results for input(s): CKTOTAL, CKMB, CKMBINDEX, TROPONINI in the last 168 hours. BNP (last 3 results) No results for input(s): PROBNP in the last 8760 hours. HbA1C: Recent Labs    06/10/21 1654  HGBA1C 5.7*   CBG: No results for input(s): GLUCAP in the last 168 hours. Lipid Profile: Recent Labs    06/10/21 1654  CHOL 122  HDL 35*  LDLCALC 77  TRIG 50  CHOLHDL 3.5   Thyroid Function Tests: No results for input(s): TSH, T4TOTAL, FREET4, T3FREE, THYROIDAB in the last 72 hours. Anemia Panel: No results for input(s): VITAMINB12, FOLATE, FERRITIN, TIBC, IRON, RETICCTPCT in the last 72  hours. Sepsis Labs: No results for input(s): PROCALCITON, LATICACIDVEN in the last 168 hours.  Recent Results (from the past 240 hour(s))  Resp Panel by RT-PCR (Flu A&B, Covid) Nasopharyngeal Swab     Status: None   Collection Time: 06/05/21  3:21 AM   Specimen: Nasopharyngeal Swab; Nasopharyngeal(NP) swabs in vial transport medium  Result Value Ref Range Status   SARS Coronavirus 2 by RT PCR NEGATIVE NEGATIVE Final    Comment: (NOTE) SARS-CoV-2 target nucleic acids are NOT DETECTED.  The SARS-CoV-2 RNA is generally detectable in upper respiratory specimens during the acute phase of infection. The lowest concentration of SARS-CoV-2 viral copies this assay can detect is 138 copies/mL. A negative result does not preclude SARS-Cov-2 infection and should not be used as the sole basis for treatment or other patient management decisions. A negative result may occur with  improper specimen collection/handling, submission of specimen other than nasopharyngeal swab, presence of viral mutation(s) within the areas targeted by this assay, and inadequate number of viral copies(<138 copies/mL). A negative result must be combined with clinical observations, patient history, and epidemiological information. The expected result is Negative.  Fact Sheet for Patients:  EntrepreneurPulse.com.au  Fact Sheet for Healthcare Providers:  IncredibleEmployment.be  This test is no t yet approved or cleared by the [place] FDA and  has been authorized for detection and/or diagnosis of SARS-CoV-2 by FDA under an Emergency Use Authorization (EUA). This EUA will remain  in effect (meaning this test can be used) for the duration of the COVID-19 declaration under Section 564(b)(1) of the Act, 21 U.S.C.section 360bbb-3(b)(1), unless the authorization is terminated  or revoked sooner.       Influenza A by PCR NEGATIVE NEGATIVE Final   Influenza B by PCR NEGATIVE  NEGATIVE Final    Comment: (NOTE) The Xpert Xpress SARS-CoV-2/FLU/RSV plus assay is intended as an aid in the diagnosis of influenza from Nasopharyngeal swab specimens and should not be used as a sole basis for treatment. Nasal washings and aspirates are unacceptable for Xpert Xpress SARS-CoV-2/FLU/RSV testing.  Fact Sheet for Patients: EntrepreneurPulse.com.au  Fact Sheet for Healthcare Providers: IncredibleEmployment.be  This test is not yet approved or cleared by the [place] FDA and has been authorized for detection and/or diagnosis of  SARS-CoV-2 by FDA under an Emergency Use Authorization (EUA). This EUA will remain in effect (meaning this test can be used) for the duration of the COVID-19 declaration under Section 564(b)(1) of the Act, 21 U.S.C. section 360bbb-3(b)(1), unless the authorization is terminated or revoked.  Performed at [facility], [place] [address], [place], [place] [address]      Radiology Studies: CT ANGIO HEAD NECK W WO CM  Result Date: 06/11/2021 CLINICAL DATA:  Stroke/TIA, assess extracranial arteries EXAM: CT ANGIOGRAPHY HEAD AND NECK TECHNIQUE: Multidetector CT imaging of the head and neck was performed using the standard protocol during bolus administration of intravenous contrast. Multiplanar CT image reconstructions and MIPs were obtained to evaluate the vascular anatomy. Carotid stenosis measurements (when applicable) are obtained utilizing NASCET criteria, using the distal internal carotid diameter as the denominator. CONTRAST:  26mL OMNIPAQUE IOHEXOL 350 MG/ML SOLN COMPARISON:  MRI from the same day.  CT head virtually 05/04/2019. FINDINGS: CT HEAD FINDINGS Brain: Small cortical infarct along the right frontal lobe better seen on same day MRI. No evidence of interval acute large vascular territory infarction, acute hemorrhage, hydrocephalus, extra-axial collection or mass lesion/mass effect. Mild to moderate  patchy white matter hypoattenuation, compatible with chronic microvascular ischemic disease Vascular: See below. Skull: No acute fracture. Sinuses: Visualized sinuses are largely clear. Orbits: No acute finding. Review of the MIP images confirms the above findings CTA NECK FINDINGS Aortic arch: Atherosclerosis.  Great vessel origins are patent. Right carotid system: Patent. Predominately calcific atherosclerosis at the carotid bifurcation without greater than 50% stenosis. Mildly tortuous ICA. Left carotid system: Patent. Predominantly calcific atherosclerosis at the carotid bifurcation with approximately 50% stenosis of the internal carotid artery origin. Mildly tortuous ICA. Vertebral arteries: Left dominant. No significant (greater than 50%) stenosis in the neck. Tortuous bilaterally. 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 Severe facet arthropathy in the upper cervical spine. Other neck: No acute abnormality. Upper chest: Severe paraseptal and centrilobular emphysema bilaterally. Biapical pleuroparenchymal scarring. No consolidation. Review of the MIP images confirms the above findings CTA HEAD FINDINGS Anterior circulation: Bilateral intracranial ICAs are patent. Mild left greater than right ICA calcific atherosclerosis without greater than 50% narrowing. Bilateral MCAs and ACAs are patent without proximal hemodynamically significant stenosis. No aneurysm identified. Posterior circulation: Small/non dominant right intradural vertebral artery which makes a large contribution to the right PICA, patent. Left intradural vertebral artery is also patent without significant stenosis. The basilar artery and posterior cerebral arteries are patent without proximal hemodynamically significant stenosis. Right fetal type PCA, anatomic variant. No aneurysm identified. Venous sinuses: As permitted by contrast timing, patent.  Review of the MIP images confirms the above findings IMPRESSION: CT head: 1. Small cortical infarct in the right frontal lobe better seen on same day MRI. 2. No evidence of other/interval acute intracranial abnormality. 3. Mild to moderate chronic microvascular changes. CTA Head: No large vessel occlusion or proximal hemodynamically significant stenosis. CTA Neck: 1. Bilateral carotid bifurcation atherosclerosis with approximately 50% stenosis of the left internal carotid artery origin. 2. Severe emphysema. Electronically Signed   By: Margaretha Sheffield MD   On: 06/11/2021 18:55   MR BRAIN WO CONTRAST  Result Date: 06/11/2021 CLINICAL DATA:  Neuro deficit, acute, stroke suspected. EXAM: MRI HEAD WITHOUT CONTRAST TECHNIQUE: Multiplanar, multiecho pulse sequences of the brain and surrounding structures were obtained without intravenous contrast. COMPARISON:  Head CT October 10, 2019. FINDINGS: Brain: Small cortical focus of  restricted diffusion in the right frontal lobe (series 2, image 28). No scattered and confluent foci of T2 hyperintensity are seen within the white matter of the cerebral hemispheres and within the pons, nonspecific, most likely related to chronic small vessel ischemia. Hemorrhage, hydrocephalus, extra-axial collection or mass lesion. Vascular: Normal flow voids. Skull and upper cervical spine: Normal marrow signal. Sinuses/Orbits: Negative. Other: None. IMPRESSION: 1. Small cortical focus of restricted diffusion within the right frontal lobe consistent with acute/subacute infarct. 2. Moderate chronic small vessel ischemic changes of the white matter. These results will be called to the ordering clinician or representative by the Radiologist Assistant, and communication documented in the PACS or [employer]. Electronically Signed   By: Pedro Earls M.D.   On: 06/11/2021 11:01   ECHOCARDIOGRAM COMPLETE  Result Date: 06/11/2021    ECHOCARDIOGRAM REPORT   Patient Name:    Martin Mendez Date of Exam: 06/11/2021 Medical Rec #:  [id number]     Height:       70.0 in Accession #:    [id number]    Weight:       160.0 lb Date of Birth:  [birth year]     BSA:          1.898 m Patient Age:    59 years      BP:           104/60 mmHg Patient Gender: M             HR:           91 bpm. Exam Location:  Inpatient Procedure: Color Doppler, Pediatric Echo and 2D Echo Indications:    TIA G45.9  History:        Patient has prior history of Echocardiogram examinations, most                 recent 10/10/2019. COPD; Risk Factors:Current Smoker.  Sonographer:    Darlina Sicilian RDCS Referring Phys: [id number] Trent Reddick IMPRESSIONS  1. Left ventricular ejection fraction, by estimation, is 55 to 60%. The left ventricle has normal function. The left ventricle has no regional wall motion abnormalities. Left ventricular diastolic parameters are consistent with Grade I diastolic dysfunction (impaired relaxation).  2. Right ventricular systolic function is mildly reduced. The right ventricular size is mildly enlarged. There is normal pulmonary artery systolic pressure.  3. The mitral valve is normal in structure. No evidence of mitral valve regurgitation. No evidence of mitral stenosis.  4. The aortic valve is normal in structure. There is mild calcification of the aortic valve. There is mild thickening of the aortic valve. Aortic valve regurgitation is not visualized. No aortic stenosis is present.  5. The inferior vena cava is normal in size with <50% respiratory variability, suggesting right atrial pressure of 8 mmHg. FINDINGS  Left Ventricle: Left ventricular ejection fraction, by estimation, is 55 to 60%. The left ventricle has normal function. The left ventricle has no regional wall motion abnormalities. The left ventricular internal cavity size was normal in size. There is  no left ventricular hypertrophy. Left ventricular diastolic parameters are consistent with Grade I diastolic dysfunction (impaired  relaxation). Indeterminate filling pressures. Right Ventricle: The right ventricular size is mildly enlarged. No increase in right ventricular wall thickness. Right ventricular systolic function is mildly reduced. There is normal pulmonary artery systolic pressure. The tricuspid regurgitant velocity  is 2.53 m/s, and with an assumed right atrial pressure of 8 mmHg, the estimated right ventricular systolic pressure is 33.6  mmHg. Left Atrium: Left atrial size was normal in size. Right Atrium: Right atrial size was normal in size. Pericardium: There is no evidence of pericardial effusion. Mitral Valve: The mitral valve is normal in structure. No evidence of mitral valve regurgitation. No evidence of mitral valve stenosis. Tricuspid Valve: The tricuspid valve is normal in structure. Tricuspid valve regurgitation is mild . No evidence of tricuspid stenosis. Aortic Valve: The aortic valve is normal in structure. There is mild calcification of the aortic valve. There is mild thickening of the aortic valve. Aortic valve regurgitation is not visualized. No aortic stenosis is present. Pulmonic Valve: The pulmonic valve was normal in structure. Pulmonic valve regurgitation is not visualized. No evidence of pulmonic stenosis. Aorta: The aortic root is normal in size and structure. Venous: The inferior vena cava is normal in size with less than 50% respiratory variability, suggesting right atrial pressure of 8 mmHg. IAS/Shunts: No atrial level shunt detected by color flow Doppler.  LEFT VENTRICLE PLAX 2D LVIDd:         4.50 cm  Diastology LVIDs:         3.30 cm  LV e' medial:    6.65 cm/s LV PW:         0.90 cm  LV E/e' medial:  11.0 LV IVS:        1.00 cm  LV e' lateral:   7.54 cm/s LVOT diam:     2.00 cm  LV E/e' lateral: 9.7 LV SV:         44 LV SV Index:   23 LVOT Area:     3.14 cm  RIGHT VENTRICLE RV S prime:     16.70 cm/s TAPSE (M-mode): 2.2 cm LEFT ATRIUM         Index LA diam:    3.30 cm 1.74 cm/m  AORTIC VALVE LVOT  Vmax:   80.00 cm/s LVOT Vmean:  54.100 cm/s LVOT VTI:    0.141 m  AORTA Ao Root diam: 3.20 cm MITRAL VALVE               TRICUSPID VALVE MV Area (PHT): 4.45 cm    TR Peak grad:   25.6 mmHg MV Decel Time: 171 msec    TR Vmax:        253.00 cm/s MV E velocity: 72.95 cm/s MV A velocity: 69.25 cm/s  SHUNTS MV E/A ratio:  1.05        Systemic VTI:  0.14 m                            Systemic Diam: 2.00 cm Skeet Latch MD Electronically signed by Skeet Latch MD Signature Date/Time: 06/11/2021/6:46:15 PM    Final    VAS [place] LOWER EXTREMITY VENOUS (DVT)  Result Date: 06/11/2021  Lower Venous DVT Study Patient Name:  Martin Mendez  Date of Exam:   06/11/2021 Medical Rec #: [id number]      Accession #:    [id number] Date of Birth: May 18, 1946      Patient Gender: M Patient Age:   74Y Exam Location:  [facility] Procedure:      VAS [place] LOWER EXTREMITY VENOUS (DVT) Referring Phys: [id number] Rosalin Hawking --------------------------------------------------------------------------------  Indications: Stroke.  Risk Factors: None identified. Comparison Study: No prior studies. Performing Technologist: Oliver Hum RVT  Examination Guidelines: A complete evaluation includes B-mode imaging, spectral Doppler, color Doppler, and power Doppler as needed of all accessible portions  of each vessel. Bilateral testing is considered an integral part of a complete examination. Limited examinations for reoccurring indications may be performed as noted. The reflux portion of the exam is performed with the patient in reverse Trendelenburg.  +---------+---------------+---------+-----------+----------+--------------+ RIGHT    CompressibilityPhasicitySpontaneityPropertiesThrombus Aging +---------+---------------+---------+-----------+----------+--------------+ CFV      Full           Yes      Yes                                 +---------+---------------+---------+-----------+----------+--------------+ SFJ      Full                                                         +---------+---------------+---------+-----------+----------+--------------+ FV Prox  Full                                                        +---------+---------------+---------+-----------+----------+--------------+ FV Mid   Full                                                        +---------+---------------+---------+-----------+----------+--------------+ FV DistalFull                                                        +---------+---------------+---------+-----------+----------+--------------+ PFV      Full                                                        +---------+---------------+---------+-----------+----------+--------------+ POP      Full           Yes      Yes                                 +---------+---------------+---------+-----------+----------+--------------+ PTV      Full                                                        +---------+---------------+---------+-----------+----------+--------------+ PERO     Full                                                        +---------+---------------+---------+-----------+----------+--------------+   +---------+---------------+---------+-----------+----------+--------------+ LEFT  CompressibilityPhasicitySpontaneityPropertiesThrombus Aging +---------+---------------+---------+-----------+----------+--------------+ CFV      Full           Yes      Yes                                 +---------+---------------+---------+-----------+----------+--------------+ SFJ      Full                                                        +---------+---------------+---------+-----------+----------+--------------+ FV Prox  Full                                                        +---------+---------------+---------+-----------+----------+--------------+ FV Mid   Full                                                         +---------+---------------+---------+-----------+----------+--------------+ FV DistalFull                                                        +---------+---------------+---------+-----------+----------+--------------+ PFV      Full                                                        +---------+---------------+---------+-----------+----------+--------------+ POP      Full           Yes      Yes                                 +---------+---------------+---------+-----------+----------+--------------+ PTV      Full                                                        +---------+---------------+---------+-----------+----------+--------------+ PERO     Full                                                        +---------+---------------+---------+-----------+----------+--------------+     Summary: RIGHT: - There is no evidence of deep vein thrombosis in the lower extremity.  - No cystic structure found in the popliteal fossa.  LEFT: - There is no evidence of deep vein thrombosis in the lower extremity.  - No cystic structure found in the  popliteal fossa.  *See table(s) above for measurements and observations. Electronically signed by Deitra Mayo MD on 06/11/2021 at 5:24:15 PM.    Final      LOS: 6 days   Antonieta Pert, MD [facility]  06/12/2021, 10:06 AM

## 2021-06-12 NOTE — Progress Notes (Signed)
SATURATION QUALIFICATIONS: (This note is used to comply with regulatory documentation for home oxygen)  Patient Saturations on Room Air at Rest = 92%  Patient Saturations on Room Air while Ambulating = 87%  Patient Saturations on 3 Liters of oxygen while Ambulating = 92%  Please briefly explain why patient needs home oxygen:  COPD

## 2021-06-12 NOTE — Progress Notes (Signed)
Pt is ready to be D/C today. He needs home O2. Notified Zack that pt has been D/C today and he needs home O2. Notified MD that pt needs an order for home O2. Met with pt. He reports that his wife informed him that [employer] informed his wife that a respiratory person from the hospital will teach them how to use the BIPAP machine at home. He is asking that [employer] clarifies who is going to do the BIPAP instructions for home. Notified Zack about pt's concern about the teaching for the BIPAP.

## 2021-06-15 ENCOUNTER — Other Ambulatory Visit: Payer: Self-pay | Admitting: Pulmonary Disease

## 2021-06-15 ENCOUNTER — Telehealth: Payer: Self-pay | Admitting: Pulmonary Disease

## 2021-06-15 ENCOUNTER — Other Ambulatory Visit: Payer: Self-pay

## 2021-06-15 MED ORDER — SPIRIVA RESPIMAT 2.5 MCG/ACT IN AERS: 2.0000 | INHALATION_SPRAY | Freq: Every day | RESPIRATORY_TRACT | 6 refills | Status: DC

## 2021-06-15 NOTE — Telephone Encounter (Signed)
Let patient know that Spiriva Respimat was sent into pharmacy-in place of Incruse

## 2021-06-15 NOTE — Progress Notes (Signed)
Spiriva Respimat sent into pharmacy in place of Incruse

## 2021-06-15 NOTE — Patient Outreach (Signed)
[facility]) Care Management  06/15/2021  Martin Mendez 06/08/1946 [id number]    [name] RED ON EMMI ALERT Day # 1 Date: 06/14/2021 Red Alert Reason: "Scheduled a follow up appt? No" Not on APL   Outreach attempt # 1 to patient. No answer after several rings and unable to leave message.        Plan: RN CM will make outreach attempt to patient within 3-4 business days.   Enzo Montgomery, RN,BSN,CCM [facility] Telephonic Care Management Coordinator Direct Phone: [phone] Toll Free: [phone] Fax: [phone]

## 2021-06-16 ENCOUNTER — Other Ambulatory Visit: Payer: Self-pay

## 2021-06-16 NOTE — Patient Outreach (Signed)
[name]) Care Management  06/16/2021  Martin Mendez 1945-12-04 [id number]   [name] RED ON EMMI ALERT Day # 1 Date: 06/14/2021 Red Alert Reason: "Scheduled a follow up appt? No" Not on APL   Outreach attempt # 2 to patient. Spoke with patient who denies any acute issues or concern at present. Reviewed and addressed red alert. Patient states he has appt with lung MD on 07/07/21 and neuro appt on 07/22/21. He has not yet made PCP appt as he states "MD did not visit him while in the hospital and didn't see the need to see MD before seeing other doctors." RN CM explained to patient that PCP does see patient while hospitalized. However, discussed importance of making an PCP follow up appt within 1-2 wks per discharge instructions. Patient voiced understanding and stets he will make an appt. He confirms he has transportation to get to appts. No issues with meds. Advised patient that they would continue to get automated [name] post discharge calls to assess how they are doing following recent hospitalization and will receive a call from a nurse if any of their responses were abnormal. Patient voiced understanding.     Plan: RN CM will close case as patient no eligible for services.   Enzo Montgomery, RN,BSN,CCM [place] Telephonic Care Management Coordinator Direct Phone: [phone] Toll Free: [phone] Fax: [phone]

## 2021-06-22 ENCOUNTER — Other Ambulatory Visit: Payer: Self-pay | Admitting: Physician Assistant

## 2021-06-28 ENCOUNTER — Other Ambulatory Visit: Payer: Self-pay

## 2021-06-28 NOTE — Patient Outreach (Signed)
[facility]) Care Management  06/28/2021  Martin Mendez 03-05-1946 [id number]    [name] RED ON EMMI ALERT Day # 13 Date: 06/26/2021 Red Alert Reason: "Had follow-up appt? No" Not on APL   Referral received. No outreach warranted at this time. Red alert addressed on previous call. Patient has MD follow up appts for 07/07/21 and 07/22/21.       Plan: RN CM will close referral.  Enzo Montgomery, RN,BSN,CCM [facility] Telephonic Care Management Coordinator Direct Phone: [phone] Toll Free: [phone] Fax: [phone]

## 2021-07-07 ENCOUNTER — Encounter: Payer: Self-pay | Admitting: Pulmonary Disease

## 2021-07-07 ENCOUNTER — Other Ambulatory Visit: Payer: Self-pay

## 2021-07-07 ENCOUNTER — Ambulatory Visit: Payer: Medicare PPO | Admitting: Pulmonary Disease

## 2021-07-07 VITALS — BP 100/60 | HR 95 | Temp 98.3°F | Ht 70.0 in | Wt 148.2 lb

## 2021-07-07 DIAGNOSIS — J449 Chronic obstructive pulmonary disease, unspecified: Secondary | ICD-10-CM

## 2021-07-07 DIAGNOSIS — Z720 Tobacco use: Secondary | ICD-10-CM

## 2021-07-07 DIAGNOSIS — J9612 Chronic respiratory failure with hypercapnia: Secondary | ICD-10-CM

## 2021-07-07 DIAGNOSIS — F17210 Nicotine dependence, cigarettes, uncomplicated: Secondary | ICD-10-CM

## 2021-07-07 NOTE — Patient Instructions (Signed)
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

## 2021-07-07 NOTE — Progress Notes (Signed)
Subjective:   PATIENT ID: Martin Mendez GENDER: male DOB: 1946-04-05, MRN: [id number]   HPI  Chief Complaint  Patient presents with   Follow-up    Pt states he is on antibiotics and inhaler, pt states he is better. Doing well on sprivia inhaler    Reason for Visit: Hospital follow-up  Martin Mendez is a 75 year old male former smoker with emphysema and PTSD who presents for hospital follow-up.  Discharge summary was reviewed.  He was admitted from 06/05/2021 to 06/09/2021 for hematemesis.  EGD on 06/05/2021 demonstrated gastritis, duodenitis and nonbleeding duodenal ulcer.  His hospital course was complicated by acute respiratory failure with hypercapnia.  He was treated with noninvasive ventilation with improvement.  Pulmonary was consulted while inpatient and recommended noninvasive ventilation for outpatient management.  Since then, he reports that he is doing well.  He has been compliant with his nightly NIV and tolerating well. He was discharged on Incruse and switched to Spiriva as an outpatient.  He likes the Spiriva better compared to the powder-based inhaler. Walks daily in the morning at a steady pace.  Has shortness of breath with moderate exertion.  Occasional cough with sputum. Denies wheeze. Quit smoking after his hospitalization.  Social History: Former smoker  I have personally reviewed patient's past medical/family/social history, allergies, current medications.  Past Medical History:  Diagnosis Date   PTSD (post-traumatic stress disorder)      No family history on file.  No pertinent family lung history  Social History   Occupational History   Not on file  Tobacco Use   Smoking status: Every Day    Packs/day: 0.50    Types: Cigarettes   Smokeless tobacco: Never  Substance and Sexual Activity   Alcohol use: No   Drug use: No   Sexual activity: Not on file    Allergies  Allergen Reactions   Penicillins Swelling    Did it involve swelling of the  face/tongue/throat, SOB, or low BP? No Did it involve sudden or severe rash/hives, skin peeling, or any reaction on the inside of your mouth or nose? No Did you need to seek medical attention at a hospital or doctor's office? No When did it last happen?    42 years ago (age 53)   If all above answers are "NO", may proceed with cephalosporin use.  Swelling of arms and legs   Sulfa Antibiotics Rash    tongue     Outpatient Medications Prior to Visit  Medication Sig Dispense Refill   acetaminophen (TYLENOL) 500 MG tablet Take 500 mg by mouth daily.     albuterol (VENTOLIN HFA) 108 (90 Base) MCG/ACT inhaler Inhale 2 puffs into the lungs every 6 (six) hours as needed for wheezing or shortness of breath. 8 g 2   citalopram (CELEXA) 20 MG tablet TAKE 1 TABLET BY MOUTH ONCE DAILY IN THE MORNING 90 tablet 0   loratadine (CLARITIN) 10 MG tablet Take 10 mg by mouth daily.     pantoprazole (PROTONIX) 40 MG tablet Take 1 tablet (40 mg total) by mouth 2 (two) times daily. 60 tablet 0   propranolol (INDERAL) 10 MG tablet TAKE 1 TABLET BY MOUTH ONCE DAILY AS NEEDED (Patient taking differently: Take 10 mg by mouth daily as needed (anxiety).) 30 tablet 0   Tiotropium Bromide Monohydrate (SPIRIVA RESPIMAT) 2.5 MCG/ACT AERS Inhale 2 puffs into the lungs daily. 4 g 6   No facility-administered medications prior to visit.    Review of Systems  Constitutional:  Negative for chills, diaphoresis, fever, malaise/fatigue and weight loss.  HENT:  Negative for congestion.   Respiratory:  Positive for shortness of breath. Negative for cough, hemoptysis, sputum production and wheezing.   Cardiovascular:  Negative for chest pain, palpitations and leg swelling.    Objective:   Vitals:   07/07/21 1431  BP: 100/60  Pulse: 95  Temp: 98.3 F (36.8 C)  TempSrc: Oral  SpO2: 96%  Weight: 148 lb 3.2 oz (67.2 kg)  Height: 5\' 10"  (1.778 m)      Physical Exam: General: Well-appearing, no acute distress HENT: [place],  AT Eyes: EOMI, no scleral icterus Respiratory: Clear to auscultation bilaterally.  No crackles, wheezing or rales Cardiovascular: RRR, -M/R/G, no JVD Extremities:-Edema,-tenderness Neuro: AAO x4, CNII-XII grossly intact Psych: Normal mood, normal affect  Data Reviewed:  Imaging: CTA 10/10/2019-moderate centrilobular emphysema.  Right upper lobe 3 mm nodular density, right lower lobe 3 mm nodule.  Left upper lobe subpleural 2 and 3 mm nodule.  Left lower lobe 3 mm nodule.  No aneurysm or dissection.  No acute findings in chest abdomen or pelvis  PFT: None on file  Labs: CBC    Component Value Date/Time   WBC 7.6 06/10/2021 0207   RBC 3.27 (L) 06/10/2021 0207   HGB 10.0 (L) 06/10/2021 0207   HCT 30.0 (L) 06/10/2021 0207   PLT 198 06/10/2021 0207   MCV 91.7 06/10/2021 0207   MCH 30.6 06/10/2021 0207   MCHC 33.3 06/10/2021 0207   RDW 12.4 06/10/2021 0207   LYMPHSABS 1.9 06/05/2021 0321   MONOABS 0.6 06/05/2021 0321   EOSABS 0.3 06/05/2021 0321   BASOSABS 0.1 06/05/2021 0321   Absolute eosinophils 06/05/2021-300  ABG    Component Value Date/Time   PHART 7.325 (L) 06/08/2021 2200   PCO2ART 81.4 (HH) 06/08/2021 2200   PO2ART 105 06/08/2021 2200   HCO3 37.8 (H) 06/10/2021 1654   O2SAT 88.0 06/10/2021 1654   Interpretation: Acute on chronic hypercarbic respiratory failure  Imaging, labs and tests noted above have been reviewed independently by me. Assessment & Plan:   Discussion: 75 year old male with moderate emphysema and chronic hypercarbic respiratory failure on noninvasive ventilation who presents for hospital follow-up.  Counseled on management of his emphysema including bronchodilators, NIV and tobacco cessation.  Congratulated him on quitting smoking and discussed triggers to avoid relapse.  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 We discussed triggers and stressors and ways to deal with them. We discussed barriers to continued smoking and benefits of smoking cessation. Provided patient with information cessation techniques and interventions   Health Maintenance Immunization History  Administered Date(s) Administered   PFIZER Comirnaty(Gray Top)Covid-19 Tri-Sucrose Vaccine 06/06/2021   CT Lung Screen-appropriate candidate.  Discuss at next visit.  No orders of the defined types were placed in this encounter. No orders of the defined types were placed in this encounter.   Return in about 6 months (around 01/07/2022).  I have spent a total time of 38-minutes on the day of the appointment reviewing prior documentation, coordinating care and discussing medical diagnosis and plan with the patient/family. Imaging, labs and tests included in this note have been reviewed and interpreted independently by me.  Amory Simonetti Rodman Pickle, MD [facility] 07/07/2021 2:13 PM  Office Number  [phone]    

## 2021-07-08 DIAGNOSIS — J9612 Chronic respiratory failure with hypercapnia: Secondary | ICD-10-CM | POA: Insufficient documentation

## 2021-07-09 ENCOUNTER — Telehealth: Payer: Medicare PPO | Admitting: Physician Assistant

## 2021-07-22 ENCOUNTER — Other Ambulatory Visit: Payer: Self-pay

## 2021-07-22 ENCOUNTER — Ambulatory Visit: Payer: Medicare PPO | Admitting: Adult Health

## 2021-07-22 ENCOUNTER — Encounter: Payer: Self-pay | Admitting: Adult Health

## 2021-07-22 VITALS — BP 122/74 | HR 96 | Ht 70.0 in | Wt 150.0 lb

## 2021-07-22 DIAGNOSIS — I639 Cerebral infarction, unspecified: Secondary | ICD-10-CM | POA: Diagnosis not present

## 2021-07-22 DIAGNOSIS — G459 Transient cerebral ischemic attack, unspecified: Secondary | ICD-10-CM | POA: Diagnosis not present

## 2021-07-22 DIAGNOSIS — I6522 Occlusion and stenosis of left carotid artery: Secondary | ICD-10-CM | POA: Diagnosis not present

## 2021-07-22 NOTE — Progress Notes (Signed)
[facility] [address] [place]. [place] [address] [phone]       HOSPITAL FOLLOW UP NOTE  [name] Date of Birth:  April 22, 1946 Medical Record Number:  [id number]   Reason for Referral:  hospital stroke follow up    SUBJECTIVE:   CHIEF COMPLAINT:  Chief Complaint  Patient presents with   Follow-up    RM 3 with spouse brenda  Pt is well and stable, no complaints     HPI:   Martin Mendez is a 75 year old right-handed male with history of COPD, smoker admitted on 06/05/2021 for upper GI bleeding status post GDA embolization.  Right before discharge on 7/28, patient had episode of aphasia, difficulty getting words out, patient feeling " tongue thickness", lasted about 5 to 10 minutes and resolved.  MRI showed right frontal small infarct which likely not able to explain patient symptoms and concern of possible left-sided TIA.  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  LE venous Doppler negative for DVT.  EF 55 to 60%.  LDL 77, A1c 5.7.  Hemoglobin 10.0, stable, creatinine 0.62.  Discussed with GI, patient not a candidate for advanced antithrombotic at that time therefore recommended aspirin 81 mg daily but patient declined in setting of recent GI bleed.  LDL 77 -declined statin use.  HTN stable on Inderal.  Other stroke risk factors include advanced age, tobacco use and family history of stroke.  Therapy evaluation with no therapy needs.  Today, 07/22/2021, Martin Mendez is being seen for hospital follow-up accompanied by his wife.  Overall doing well.  Denies new or reoccurring stroke/TIA symptoms.  Blood pressure today 122/74.  Routinely monitors at home which has been stable.  No additional bleeding concerns or symptoms and has f/u with GI Dr. Paulita Fujita on 10/13.  PCP appointment 11/1 to establish care with Dr. Lysle Rubens as his prior PCP will be retiring. Wife has multiple appropriate questions regarding recent stroke and possible etiology, imaging and  recommended treatment options.  No further concerns at this time.    PERTINENT IMAGING  MR BRAIN 06/11/2021 IMPRESSION: 1. Small cortical focus of restricted diffusion within the right frontal lobe consistent with acute/subacute infarct. 2. Moderate chronic small vessel ischemic changes of the white matter.   CT HEAD 06/12/2019 CTA HEAD/NECK 06/11/2021 IMPRESSION: CT head:   1. Small cortical infarct in the right frontal lobe better seen on same day MRI. 2. No evidence of other/interval acute intracranial abnormality. 3. Mild to moderate chronic microvascular changes.   CTA Head:   No large vessel occlusion or proximal hemodynamically significant stenosis.   CTA Neck:   1. Bilateral carotid bifurcation atherosclerosis with approximately 50% stenosis of the left internal carotid artery origin. 2. Severe emphysema.       ROS:   14 system review of systems performed and negative with exception of those listed in HPI  PMH:  Past Medical History:  Diagnosis Date   PTSD (post-traumatic stress disorder)     PSH:  Past Surgical History:  Procedure Laterality Date   ESOPHAGOGASTRODUODENOSCOPY (EGD) WITH PROPOFOL N/A 06/05/2021   Procedure: ESOPHAGOGASTRODUODENOSCOPY (EGD) WITH PROPOFOL;  Surgeon: [name], MD;  Location: [facility] ENDOSCOPY;  Service: Endoscopy;  Laterality: N/A;   IR ANGIOGRAM FOLLOW UP STUDY  06/07/2021   IR ANGIOGRAM SELECTIVE EACH ADDITIONAL VESSEL  06/07/2021   IR ANGIOGRAM VISCERAL SELECTIVE  06/07/2021   IR EMBO ART  VEN HEMORR LYMPH EXTRAV  INC GUIDE ROADMAPPING  06/07/2021  IR US GUIDE VASC ACCESS RIGHT  06/07/2021    Social History:  Social History   Socioeconomic History   Marital status: Single    Spouse name: Not on file   Number of children: Not on file   Years of education: Not on file   Highest education level: Not on file  Occupational History   Not on file  Tobacco Use   Smoking status: Former    Packs/day: 1.00    Years:  60.00    Pack years: 60.00    Types: Cigarettes    Quit date: 06/04/2021    Years since quitting: 0.1   Smokeless tobacco: Never  Substance and Sexual Activity   Alcohol use: No   Drug use: No   Sexual activity: Not on file  Other Topics Concern   Not on file  Social History Narrative   Not on file   Social Determinants of Health   Financial Resource Strain: Not on file  Food Insecurity: Not on file  Transportation Needs: Not on file  Physical Activity: Not on file  Stress: Not on file  Social Connections: Not on file  Intimate Partner Violence: Not on file    Family History: History reviewed. No pertinent family history.  Medications:   Current Outpatient Medications on File Prior to Visit  Medication Sig Dispense Refill   acetaminophen (TYLENOL) 500 MG tablet Take 500 mg by mouth as needed.     albuterol (VENTOLIN HFA) 108 (90 Base) MCG/ACT inhaler Inhale 2 puffs into the lungs every 6 (six) hours as needed for wheezing or shortness of breath. 8 g 2   citalopram (CELEXA) 20 MG tablet TAKE 1 TABLET BY MOUTH ONCE DAILY IN THE MORNING 90 tablet 0   propranolol (INDERAL) 10 MG tablet TAKE 1 TABLET BY MOUTH ONCE DAILY AS NEEDED (Patient taking differently: Take 10 mg by mouth daily as needed (anxiety).) 30 tablet 0   Tiotropium Bromide Monohydrate (SPIRIVA RESPIMAT) 2.5 MCG/ACT AERS Inhale 2 puffs into the lungs daily. 4 g 6   No current facility-administered medications on file prior to visit.    Allergies:   Allergies  Allergen Reactions   Penicillins Swelling    Did it involve swelling of the face/tongue/throat, SOB, or low BP? No Did it involve sudden or severe rash/hives, skin peeling, or any reaction on the inside of your mouth or nose? No Did you need to seek medical attention at a hospital or doctor's office? No When did it last happen?    32 years ago (age 20)   If all above answers are "NO", may proceed with cephalosporin use.  Swelling of arms and legs    Sulfa Antibiotics Rash    tongue      OBJECTIVE:  Physical Exam  Vitals:   07/22/21 1423  BP: 122/74  Pulse: 96  Weight: 150 lb (68 kg)  Height: 5\' 10"  (1.778 m)   Body mass index is 21.52 kg/m. No results found.  Post stroke PHQ 2/9 Depression screen PHQ 2/9 07/22/2021  Decreased Interest 0  Down, Depressed, Hopeless 0  PHQ - 2 Score 0     General: well developed, well nourished, very pleasant elderly Caucasian male, seated, in no evident distress Head: head normocephalic and atraumatic.   Neck: supple with no carotid or supraclavicular bruits Cardiovascular: regular rate and rhythm, no murmurs Musculoskeletal: no deformity Skin:  no rash/petichiae Vascular:  Normal pulses all extremities   Neurologic Exam Mental Status: Awake and fully alert.  Fluent speech and language.  Oriented to place and time. Recent and remote memory intact. Attention span, concentration and fund of knowledge appropriate. Mood and affect appropriate.  Cranial Nerves: Fundoscopic exam reveals sharp disc margins. Pupils equal, briskly reactive to light. Extraocular movements full without nystagmus. Visual fields full to confrontation. Hearing intact. Facial sensation intact. Face, tongue, palate moves normally and symmetrically.  Motor: Normal bulk and tone. Normal strength in all tested extremity muscles Sensory.: intact to touch , pinprick , position and vibratory sensation.  Coordination: Rapid alternating movements normal in all extremities. Finger-to-nose and heel-to-shin performed accurately bilaterally. Gait and Station: Arises from chair without difficulty. Stance is normal. Gait demonstrates normal stride length and balance without use of assistive device.  Reflexes: 1+ and symmetric. Toes downgoing.     NIHSS  0 Modified Rankin  0      ASSESSMENT: Martin Mendez is a 74 y.o. 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 Vascular risk factors include left ICA 50% stenosis, HTN, tobacco use and advanced age.      PLAN:  R frontal stroke, ?L TIA :  Recovered without residual deficit Discussed use of aspirin 81 mg daily for secondary stroke prevention measures.  He is extremely reluctant to be on any type of blood thinning medications as his brother recently experienced a hemorrhagic stroke while on aspirin.  He does plan on further discussing use of blood thinning medications with his GI on 10/13.   May consider 30-day cardiac event monitor in the future to rule out atrial fibrillation if GI clears to consider use of AC if needed - if not cleared, no indication to pursue further testing.   Discussed secondary stroke prevention measures and importance of close PCP follow up for aggressive stroke risk factor management. I have gone over the pathophysiology of stroke, warning signs and symptoms, risk factors and their management in some detail with instructions to go to the closest emergency room for symptoms of concern. Left ICA stenosis: Plan carotid duplex around 11/2021 for surveillance monitoring.  CTA 05/2021 left ICA 50% stenosis.  Remains asymptomatic. HTN: BP goal <130/90.  Stable on current regimen per PCP    Follow up in 6 months or call earlier if needed   CC:  [place] provider: Dr. Leonie Man PCP: Wenda Low, MD    I spent 56 minutes of face-to-face and non-face-to-face time with patient.  This included previsit chart review including review of recent hospitalization, lab review, study review, order entry, electronic health record documentation, patient and wife education regarding recent stroke including etiology, secondary stroke prevention measures and importance of managing stroke risk factors and answered all other questions to patient satisfaction  Frann Rider, AGNP-BC  [facility] [address] [place], [place] [address]  Phone [phone] Fax [phone] Note: This document was prepared with digital dictation and possible smart phrase technology. Any transcriptional errors that result from this process are unintentional.

## 2021-07-22 NOTE — Patient Instructions (Addendum)
Would recommend use of aspirin 81mg  daily for stroke prevention - please ensure this is further discussed with your gastroenterologist on 10/13 regarding risk vs benefit  Also consider doing a 30-day heart monitor to further look for possible atrial fibrillation as possible cause of recent stroke - please let me know if this is something you would like to proceed with the future if okay with gastroenterology  We will plan on obtaining a carotid ultrasound around 11/2021 for monitoring of your left carotid artery stenosis (narrowing) - you will be called to schedule  Continue to follow up with PCP regarding cholesterol and blood pressure management  Maintain strict control of hypertension with blood pressure goal below 130/90 and cholesterol with LDL cholesterol (bad cholesterol) goal below 70 mg/dL.       Followup in the future with me in 6 months or call earlier if needed       Thank you for coming to see [place] at [facility]. I hope we have been able to provide you high quality care today.  You may receive a patient satisfaction survey over the next few weeks. We would appreciate your feedback and comments so that we may continue to improve ourselves and the health of our patients.   Stroke Prevention Some medical conditions and behaviors are associated with a higher chance of having a stroke. You can help prevent a stroke by making nutrition, lifestyle, and other changes, including managing any medical conditions you may have. What nutrition changes can be made?  Eat healthy foods. You can do this by: Choosing foods high in fiber, such as fresh fruits and vegetables and whole grains. Eating at least 5 or more servings of fruits and vegetables a day. Try to fill half of your plate at each meal with fruits and vegetables. Choosing lean protein foods, such as lean cuts of meat, poultry without skin, fish, tofu, beans, and nuts. Eating low-fat dairy products. Avoiding  foods that are high in salt (sodium). This can help lower blood pressure. Avoiding foods that have saturated fat, trans fat, and cholesterol. This can help prevent high cholesterol. Avoiding processed and premade foods. Follow your health care provider's specific guidelines for losing weight, controlling high blood pressure (hypertension), lowering high cholesterol, and managing diabetes. These may include: Reducing your daily calorie intake. Limiting your daily sodium intake to 1,500 milligrams (mg). Using only healthy fats for cooking, such as olive oil, canola oil, or sunflower oil. Counting your daily carbohydrate intake. What lifestyle changes can be made? Maintain a healthy weight. Talk to your health care provider about your ideal weight. Get at least 30 minutes of moderate physical activity at least 5 days a week. Moderate activity includes brisk walking, biking, and swimming. Do not use any products that contain nicotine or tobacco, such as cigarettes and e-cigarettes. If you need help quitting, ask your health care provider. It may also be helpful to avoid exposure to secondhand smoke. Limit alcohol intake to no more than 1 drink a day for nonpregnant women and 2 drinks a day for men. One drink equals 12 oz of beer, 5 oz of wine, or 1 oz of hard liquor. Stop any illegal drug use. Avoid taking birth control pills. Talk to your health care provider about the risks of taking birth control pills if: You are over 75 years old. You smoke. You get migraines. You have ever had a blood clot. What other changes can be made? Manage your cholesterol levels. Eating a healthy  diet is important for preventing high cholesterol. If cholesterol cannot be managed through diet alone, you may also need to take medicines. Take any prescribed medicines to control your cholesterol as told by your health care provider. Manage your diabetes. Eating a healthy diet and exercising regularly are important parts  of managing your blood sugar. If your blood sugar cannot be managed through diet and exercise, you may need to take medicines. Take any prescribed medicines to control your diabetes as told by your health care provider. Control your hypertension. To reduce your risk of stroke, try to keep your blood pressure below 130/80. Eating a healthy diet and exercising regularly are an important part of controlling your blood pressure. If your blood pressure cannot be managed through diet and exercise, you may need to take medicines. Take any prescribed medicines to control hypertension as told by your health care provider. Ask your health care provider if you should monitor your blood pressure at home. Have your blood pressure checked every year, even if your blood pressure is normal. Blood pressure increases with age and some medical conditions. Get evaluated for sleep disorders (sleep apnea). Talk to your health care provider about getting a sleep evaluation if you snore a lot or have excessive sleepiness. Take over-the-counter and prescription medicines only as told by your health care provider. Aspirin or blood thinners (antiplatelets or anticoagulants) may be recommended to reduce your risk of forming blood clots that can lead to stroke. Make sure that any other medical conditions you have, such as atrial fibrillation or atherosclerosis, are managed. What are the warning signs of a stroke? The warning signs of a stroke can be easily remembered as BEFAST. B is for balance. Signs include: Dizziness. Loss of balance or coordination. Sudden trouble walking. E is for eyes. Signs include: A sudden change in vision. Trouble seeing. F is for face. Signs include: Sudden weakness or numbness of the face. The face or eyelid drooping to one side. A is for arms. Signs include: Sudden weakness or numbness of the arm, usually on one side of the body. S is for speech. Signs include: Trouble speaking  (aphasia). Trouble understanding. T is for time. These symptoms may represent a serious problem that is an emergency. Do not wait to see if the symptoms will go away. Get medical help right away. Call your local emergency services (911 in the [place] Do not drive yourself to the hospital. Other signs of stroke may include: A sudden, severe headache with no known cause. Nausea or vomiting. Seizure. Where to find more information For more information, visit: American Stroke Association: www.strokeassociation.org National Stroke Association: www.stroke.org Summary You can prevent a stroke by eating healthy, exercising, not smoking, limiting alcohol intake, and managing any medical conditions you may have. Do not use any products that contain nicotine or tobacco, such as cigarettes and e-cigarettes. If you need help quitting, ask your health care provider. It may also be helpful to avoid exposure to secondhand smoke. Remember BEFAST for warning signs of stroke. Get help right away if you or a loved one has any of these signs. This information is not intended to replace advice given to you by your health care provider. Make sure you discuss any questions you have with your health care provider. Document Revised: 10/13/2017 Document Reviewed: 12/06/2016 Elsevier Patient Education  2021 [employer].

## 2021-08-09 ENCOUNTER — Telehealth: Payer: Self-pay | Admitting: Adult Health

## 2021-08-09 NOTE — Telephone Encounter (Signed)
[employer] no auth require, sent Butch Penny a message she will reach out to the patient to schedule.

## 2021-08-19 ENCOUNTER — Ambulatory Visit (HOSPITAL_COMMUNITY): Payer: Medicare PPO

## 2021-08-20 ENCOUNTER — Other Ambulatory Visit: Payer: Self-pay

## 2021-08-20 ENCOUNTER — Ambulatory Visit (HOSPITAL_COMMUNITY)
Admission: RE | Admit: 2021-08-20 | Discharge: 2021-08-20 | Disposition: A | Discharge status: Home / Self Care | Payer: Medicare PPO | Source: Ambulatory Visit | Attending: Adult Health | Admitting: Adult Health

## 2021-08-20 DIAGNOSIS — I6522 Occlusion and stenosis of left carotid artery: Secondary | ICD-10-CM | POA: Insufficient documentation

## 2021-08-23 ENCOUNTER — Telehealth: Payer: Self-pay | Admitting: *Deleted

## 2021-08-23 NOTE — Telephone Encounter (Signed)
Spoke with patient and informed him that recent carotid ultrasound showed normal right carotid artery and only mild narrowing of left carotid artery (1-39%). Janett Billow will plan on repeating the scan in 1 year.  Patient verbalized understanding, appreciation.

## 2021-08-25 ENCOUNTER — Encounter: Payer: Self-pay | Admitting: Physician Assistant

## 2021-08-25 ENCOUNTER — Telehealth: Payer: Medicare PPO | Admitting: Physician Assistant

## 2021-08-25 DIAGNOSIS — F3341 Major depressive disorder, recurrent, in partial remission: Secondary | ICD-10-CM

## 2021-08-25 DIAGNOSIS — F411 Generalized anxiety disorder: Secondary | ICD-10-CM

## 2021-08-25 NOTE — Progress Notes (Signed)
[facility] Check  Patient ID: Martin Mendez,  MRN: [id number]  PCP: Wenda Low, MD  Date of Evaluation: 08/25/2021 Time spent:30 minutes  Chief Complaint:  Chief Complaint   Anxiety; Depression; Follow-up    Virtual Visit via Telehealth  I connected with patient by telephone, with their informed consent, and verified patient privacy and that I am speaking with the correct person using two identifiers.  I am private, in my office and the patient is at home.  I discussed the limitations, risks, security and privacy concerns of performing an evaluation and management service by telephone and the availability of in person appointments. I also discussed with the patient that there may be a patient responsible charge related to this service. The patient expressed understanding and agreed to proceed.   I discussed the assessment and treatment plan with the patient. The patient was provided an opportunity to ask questions and all were answered. The patient agreed with the plan and demonstrated an understanding of the instructions.   The patient was advised to call back or seek an in-person evaluation if the symptoms worsen or if the condition fails to improve as anticipated.  I provided 30 minutes of non-face-to-face time during this encounter.   HISTORY/CURRENT STATUS: For routine med check.  Quit smoking in July! Doing really well with diet.  Not having any cravings at the present time.  Not having to use albuterol inhaler although he does use Spiriva daily.  Was hospitalized in July, see review of systems.  Feels completely back to his baseline now.  Celexa is still working very well.  He is able to enjoy things.  Denies decreased energy or motivation.  Appetite has not changed.  No extreme sadness, tearfulness, or feelings of hopelessness.  Denies any changes in concentration, making decisions or remembering things.  Denies suicidal or homicidal thoughts.  Does not have  anxiety attacks very often but when he does, he takes the propranolol.  It is still helpful.  He does not get dizzy or feel faint after he takes one.  Denies dizziness, syncope, seizures, numbness, tingling, tremor, tics, unsteady gait, slurred speech, confusion. Denies muscle or joint pain, stiffness, or dystonia.  Individual Medical History/ Review of Systems: Changes? :Yes    was admitted for GI bleeding 06/05/2021, procedure done, cauterized vessel and is fine now. 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 No neuro deficits. Otherwise no   Past medications for mental health diagnoses include: unknown  Allergies: Penicillins and Sulfa antibiotics  Current Medications:  Current Outpatient Medications:    acetaminophen (TYLENOL) 500 MG tablet, Take 500 mg by mouth as needed., Disp: , Rfl:    citalopram (CELEXA) 20 MG tablet, TAKE 1 TABLET BY MOUTH ONCE DAILY IN THE MORNING, Disp: 90 tablet, Rfl: 0   propranolol (INDERAL) 10 MG tablet, TAKE 1 TABLET BY MOUTH ONCE DAILY AS NEEDED (Patient taking differently: Take 10 mg by mouth daily as needed (anxiety).), Disp: 30 tablet, Rfl: 0   Tiotropium Bromide Monohydrate (SPIRIVA RESPIMAT) 2.5 MCG/ACT AERS, Inhale 2 puffs into the lungs daily., Disp: 4 g, Rfl: 6   albuterol (VENTOLIN HFA) 108 (90 Base) MCG/ACT inhaler, Inhale 2 puffs into the lungs every 6 (six) hours as needed for wheezing or shortness of breath. (Patient not taking: Reported on 08/25/2021), Disp: 8 g, Rfl: 2 Medication Side Effects: none  Family Medical/ Social History: Changes? No  MENTAL HEALTH EXAM:  There were no vitals taken for this visit.There is no height or  weight on file to calculate BMI.  General Appearance:  unable to assess  Eye Contact:   unable to assess  Speech:  Clear and Coherent and Normal Rate  Volume:  Normal  Mood:  Euthymic  Affect:   unable to assess  Thought Process:  Goal Directed and Descriptions of Associations: Circumstantial  Orientation:  Full (Time, Place,  and Person)  Thought Content: Logical   Suicidal Thoughts:  No  Homicidal Thoughts:  No  Memory:  WNL  Judgement:  Good  Insight:  Good  Psychomotor Activity:   unable to assess  Concentration:  Concentration: Good and Attention Span: Good  Recall:  Good  Fund of Knowledge: Good  Language: Good  Assets:  Desire for Improvement  ADL's:  Intact  Cognition: WNL  Prognosis:  Good   Reviewed notes from hospitalization the end of July and early August of this year.  DIAGNOSES:    ICD-10-CM   1. Recurrent major depressive disorder, in partial remission ([place])  F33.41     2. Generalized anxiety disorder  F41.1       Receiving Psychotherapy: No    RECOMMENDATIONS:  PDMP reviewed.  No results available. I provided 30 minutes of non-face-to-face time during this encounter, including time spent before and after the visit in records review, medical decision making, and charting.  I am glad to hear that he is doing so well and recovered completely after the GI bleed and stroke. No change in mental health meds are necessary. Continue Celexa 20 mg qd. Continue Propranolol 10 mg qid prn anxiety.  When he needs prescription next time, will give a 90-day supply with refills. Return in 1 year.   Donnal Moat, PA-C

## 2021-09-26 ENCOUNTER — Other Ambulatory Visit: Payer: Self-pay | Admitting: Physician Assistant

## 2021-12-21 ENCOUNTER — Other Ambulatory Visit: Payer: Self-pay

## 2021-12-21 ENCOUNTER — Encounter: Payer: Self-pay | Admitting: Pulmonary Disease

## 2021-12-21 ENCOUNTER — Ambulatory Visit: Payer: Medicare PPO | Admitting: Pulmonary Disease

## 2021-12-21 VITALS — BP 128/58 | HR 89 | Temp 98.3°F | Ht 70.0 in | Wt 174.8 lb

## 2021-12-21 DIAGNOSIS — J9612 Chronic respiratory failure with hypercapnia: Secondary | ICD-10-CM | POA: Diagnosis not present

## 2021-12-21 DIAGNOSIS — J449 Chronic obstructive pulmonary disease, unspecified: Secondary | ICD-10-CM

## 2021-12-21 MED ORDER — BUDESONIDE-FORMOTEROL FUMARATE 160-4.5 MCG/ACT IN AERO: 2.0000 | INHALATION_SPRAY | Freq: Two times a day (BID) | RESPIRATORY_TRACT | 3 refills | Status: DC

## 2021-12-21 NOTE — Patient Instructions (Addendum)
°  COPD, unspecified --CONTINUE Spiriva TWO puffs ONCE daily --START Symbicort TWO puffs TWICE a day for one month for improvement in mucous clearance. 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 --CONTINUE CPAP nightly  Follow-up with me 6 months

## 2021-12-21 NOTE — Progress Notes (Signed)
Subjective:   PATIENT ID: Martin Mendez GENDER: male DOB: Jul 17, 1946, MRN: [id number]   HPI  Chief Complaint  Patient presents with   Follow-up    cough    Reason for Visit: Follow-up  Martin Mendez is a 76 year old male former smoker with emphysema and PTSD who presents for follow-up  Synopsis: He was admitted from 06/05/2021 to 06/09/2021 for hematemesis.  EGD on 06/05/2021 demonstrated gastritis, duodenitis and nonbleeding duodenal ulcer.  His hospital course was complicated by acute respiratory failure with hypercapnia.  He was treated with noninvasive ventilation with improvement.  Pulmonary was consulted while inpatient and recommended noninvasive ventilation for outpatient management.  07/07/21 Since then, he reports that he is doing well.  He has been compliant with his nightly NIV and tolerating well. He was discharged on Incruse and switched to Spiriva as an outpatient.  He likes the Spiriva better compared to the powder-based inhaler. Walks daily in the morning at a steady pace.  Has shortness of breath with moderate exertion.  Occasional cough with sputum. Denies wheeze. Quit smoking after his hospitalization.  12/21/21 He reports compliance with Spiriva. Denies shortness of breath or wheezing. Every 1-2 weeks he will have coughing spells. It can be incited when drinking cough followed by expelling thick yellow sputum. But this can also occur at rest. No exacerbations since July 2022. He wears his CPAP every night for at least 6 hours nightly.  Social History: Former smoker  Past Medical History:  Diagnosis Date   PTSD (post-traumatic stress disorder)     Outpatient Medications Prior to Visit  Medication Sig Dispense Refill   acetaminophen (TYLENOL) 500 MG tablet Take 500 mg by mouth as needed.     albuterol (VENTOLIN HFA) 108 (90 Base) MCG/ACT inhaler Inhale 2 puffs into the lungs every 6 (six) hours as needed for wheezing or shortness of breath. 8 g 2   citalopram  (CELEXA) 20 MG tablet TAKE 1 TABLET BY MOUTH ONCE DAILY IN THE MORNING 90 tablet 1   propranolol (INDERAL) 10 MG tablet TAKE 1 TABLET BY MOUTH ONCE DAILY AS NEEDED (Patient taking differently: Take 10 mg by mouth daily as needed (anxiety).) 30 tablet 0   Tiotropium Bromide Monohydrate (SPIRIVA RESPIMAT) 2.5 MCG/ACT AERS Inhale 2 puffs into the lungs daily. 4 g 6   No facility-administered medications prior to visit.    Review of Systems  Constitutional:  Negative for chills, diaphoresis, fever, malaise/fatigue and weight loss.  HENT:  Negative for congestion.   Respiratory:  Positive for cough and sputum production. Negative for hemoptysis, shortness of breath and wheezing.   Cardiovascular:  Negative for chest pain, palpitations and leg swelling.    Objective:   Vitals:   12/21/21 1402  BP: (!) 128/58  Pulse: 89  Temp: 98.3 F (36.8 C)  TempSrc: Oral  SpO2: 92%  Weight: 174 lb 12.8 oz (79.3 kg)  Height: 5\' 10"  (1.778 m)   SpO2: 92 % O2 Device: None (Room air)  Physical Exam: General: Well-appearing, no acute distress HENT: [place], AT Eyes: EOMI, no scleral icterus Respiratory: Clear to auscultation bilaterally.  No crackles, wheezing or rales Cardiovascular: RRR, -M/R/G, no JVD Extremities:-Edema,-tenderness Neuro: AAO x4, CNII-XII grossly intact Psych: Normal mood, normal affect  Data Reviewed:  Imaging: CTA 10/10/2019-moderate centrilobular emphysema.  Right upper lobe 3 mm nodular density, right lower lobe 3 mm nodule.  Left upper lobe subpleural 2 and 3 mm nodule.  Left lower lobe 3 mm nodule.  No  aneurysm or dissection.  No acute findings in chest abdomen or pelvis  PFT: None on file  Labs: CBC    Component Value Date/Time   WBC 7.6 06/10/2021 0207   RBC 3.27 (L) 06/10/2021 0207   HGB 10.0 (L) 06/10/2021 0207   HCT 30.0 (L) 06/10/2021 0207   PLT 198 06/10/2021 0207   MCV 91.7 06/10/2021 0207   MCH 30.6 06/10/2021 0207   MCHC 33.3 06/10/2021 0207   RDW  12.4 06/10/2021 0207   LYMPHSABS 1.9 06/05/2021 0321   MONOABS 0.6 06/05/2021 0321   EOSABS 0.3 06/05/2021 0321   BASOSABS 0.1 06/05/2021 0321   Absolute eosinophils 06/05/2021-300  ABG    Component Value Date/Time   PHART 7.325 (L) 06/08/2021 2200   PCO2ART 81.4 (HH) 06/08/2021 2200   PO2ART 105 06/08/2021 2200   HCO3 37.8 (H) 06/10/2021 1654   O2SAT 88.0 06/10/2021 1654   Interpretation: Acute on chronic hypercarbic respiratory failure  Assessment & Plan:   Discussion: 76 year old male with moderate emphysema and chronic hypercarbic respiratroy failure on noninvasive ventilation who presents for follow-up. No exacerbations since last visit. COPD symptoms overall well-controlled however continues to have persistent sputum production requiring mucous clearance. We discussed strategies including utilizing bronchodilators and flutter valve. Patient declined flutter valve.  COPD, unspecified --CONTINUE Spiriva TWO puffs ONCE daily --START Symbicort TWO puffs TWICE a day for one month for improvement in mucous clearance. 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 --CONTINUE CPAP nightly  Tobacco cessation --Quit smoking July 2022  Health Maintenance Immunization History  Administered Date(s) Administered   Fluad Quad(high Dose 65+) 10/11/2021   Influenza-Unspecified 09/14/2008   PFIZER Comirnaty([place])Covid-19 Tri-Sucrose Vaccine 06/06/2021   CT Lung Screen- Refer to program  Orders Placed This Encounter  Procedures   Ambulatory Referral for Lung Cancer Scre    Referral Priority:   Routine    Referral Type:   Consultation    Referral Reason:   Specialty Services Required    Number of Visits Requested:    1   Meds ordered this encounter  Medications   budesonide-formoterol (SYMBICORT) 160-4.5 MCG/ACT inhaler    Sig: Inhale 2 puffs into the lungs in the morning and at bedtime.    Dispense:  1 each    Refill:  3   Return in about 6 months (around 06/20/2022).  I have spent a total time of 36-minutes on the day of the appointment reviewing prior documentation, coordinating care and discussing medical diagnosis and plan with the patient/family. Past medical history, allergies, medications were reviewed. Pertinent imaging, labs and tests included in this note have been reviewed and interpreted independently by me.  [place], MD [facility] 12/21/2021 2:08 PM  Office Number [phone]

## 2021-12-30 ENCOUNTER — Encounter: Payer: Self-pay | Admitting: Allergy & Immunology

## 2021-12-30 ENCOUNTER — Other Ambulatory Visit: Payer: Self-pay

## 2021-12-30 ENCOUNTER — Ambulatory Visit: Payer: Medicare PPO | Admitting: Allergy & Immunology

## 2021-12-30 VITALS — BP 130/72 | HR 92 | Temp 98.4°F | Resp 18 | Ht 70.0 in | Wt 176.8 lb

## 2021-12-30 DIAGNOSIS — R21 Rash and other nonspecific skin eruption: Secondary | ICD-10-CM | POA: Diagnosis not present

## 2021-12-30 DIAGNOSIS — L298 Other pruritus: Secondary | ICD-10-CM | POA: Diagnosis not present

## 2021-12-30 DIAGNOSIS — T50905D Adverse effect of unspecified drugs, medicaments and biological substances, subsequent encounter: Secondary | ICD-10-CM

## 2021-12-30 NOTE — Patient Instructions (Addendum)
1. Aquagenic pruritus - I am going to do work to see how to treat this. - I am surprised that the antihistamines did not help at all. - We might need to increase it.   2. Adverse effect of drug - I wonder if we could do an antihistamine with the Prilosec or the PPI. - I am going to talk to Dr. Paulita Fujita about the need for a daily reflux medication.   3. Rash - We are going to get some labs to look for serious causes of difficult to control causes rashes/hives. - Take pictures of the rash if it comes back for any reason. - We will call you in 1-2 weeks with the results of the testing.   4. Return in about 6 weeks (around 02/10/2022).    Please inform us of any Emergency Department visits, hospitalizations, or changes in symptoms. Call us before going to the ED for breathing or allergy symptoms since we might be able to fit you in for a sick visit. Feel free to contact us anytime with any questions, problems, or concerns.  It was a pleasure to meet you and your bride today!  Websites that have reliable patient information: 1. American Academy of Asthma, Allergy, and Immunology: www.aaaai.org 2. Food Allergy Research and Education (FARE): foodallergy.org 3. Mothers of Asthmatics: http://www.asthmacommunitynetwork.org 4. American College of Allergy, Asthma, and Immunology: www.acaai.org   COVID-19 Vaccine Information can be found at: ShippingScam.co.uk For questions related to vaccine distribution or appointments, please email [email or web address] or call [phone].   We realize that you might be concerned about having an allergic reaction to the COVID19 vaccines. To help with that concern, WE ARE OFFERING THE COVID19 VACCINES IN OUR OFFICE! Ask the front desk for dates!     Like [place] on [employer] and Instagram for our latest updates!      A healthy democracy works best when [employer] participate! Make sure you are  registered to vote! If you have moved or changed any of your contact information, you will need to get this updated before voting!  In some cases, you MAY be able to register to vote online: [email or web address]

## 2021-12-30 NOTE — Progress Notes (Signed)
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 Aquagenic pruritus - I am going to do work to see how to treat this. - I am surprised that the antihistamines did not help at all. - We might need to increase it.   2. Adverse effect of drug - I wonder if we could do an antihistamine with the Prilosec or the PPI. - I am going to talk to Dr. Paulita Fujita about the need for a daily reflux medication.   3. Rash - We are going to get some labs to look for serious causes of difficult to control causes rashes/hives. - Take pictures of the rash if it comes back for any reason. - We will call you in 1-2 weeks with the results of the testing.   4. Return in about 6 weeks (around 02/10/2022).    This note in its entirety was forwarded to the Provider who requested this consultation.  Subjective:   Martin Mendez is a 76 y.o. male presenting today for evaluation of  Chief Complaint  Patient presents with   Urticaria    Patient states when getting out of the shower he starts itching real bad. Water is lukewarm.    Martin Mendez has a history of the following: Patient Active Problem List   Diagnosis Date Noted   Chronic respiratory failure with hypercapnia ([place]) 07/08/2021   Cerebral embolism with cerebral infarction 06/11/2021   Syncope 10/10/2019   Chronic obstructive pulmonary disease ([place]) 12/13/2018   Hyponatremia 12/11/2018   Former smoker 12/10/2018   PTSD (post-traumatic stress disorder) 12/10/2018    History obtained from: chart review and patient and his wife .  Martin Mendez was referred by Wenda Low, MD.     Martin Mendez is a 76 y.o. male presenting for an evaluation of reactions to medications .  He was placed on Prilosec and Protonix at two different occasions. He started having some red splotches on  his face within a few days of taking the Prilosec. Then they changed to the Protonix. Red psloitches cleared up within 2 days of stopping the Prilosec. Then within 2 days the splytochaes were back. They were not itchy, but they were just "there". None of the rashes itched at all. He had some cheek swleling as well.   He was hospitalized from 06/05/21 through 06/11/21. He had to have surgery and they did a procedure to cauterize the vessel. He also had a stroke when he was in the hospital. Unfortunately, it was just minutes. He was being placed on the PPI To help with giving his stomach time to heal.  Splotches were irregular shapped. This was a smooth rash. It was not bumpy. He does not have a picture of what he looked like.   He also reports that the water in the shower makes him "itch like crazy". It is prickling and burning and can last anywhere from 15 minutes to 60 minutes. This is every time that he takes a shower. This started in 2004.   He did go to his PCP and he was diagnosed with aquagenic pruritis. He was sent to see Dermatology (Dr. Allyson Sabal). He was placed on Zyrtec as well as Allegra, neither of which helped at all. He thinks that he only took once daily. He does not really swim at all.  He is allergic to penicillin (age 18 years, which included hand swelling and whelps on his back and legs, treated with a steroid injection). He had a "breakout" of red splots in his mouth with exposure to sulfa. He reports that his tongue was "rather tender". This was 20-30 years ago.    Asthma/Respiratory Symptom History: She follows with Dr. Loanne Drilling since his admission to the hospital. He is on  Spiriva 2.54mcg two puffs once daily. He is also on Symbicort 115mcg two puffs BID; he has not seen a benefit of this. Next appt is 6 months from now. Symbicort was added dur to coughing episodes that lasted a while. He did not feel that he could get the mucous up. Dr. Terri Piedra thought that the Symbicort would help  mobilize the mucous.   Otherwise, there is no history of other atopic diseases, including food allergies, drug allergies, stinging insect allergies, eczema, urticaria, or contact dermatitis. There is no significant infectious history. Vaccinations are up to date.    Past Medical History: Patient Active Problem List   Diagnosis Date Noted   Chronic respiratory failure with hypercapnia ([place]) 07/08/2021   Cerebral embolism with cerebral infarction 06/11/2021   Syncope 10/10/2019   Chronic obstructive pulmonary disease ([place]) 12/13/2018   Hyponatremia 12/11/2018   Former smoker 12/10/2018   PTSD (post-traumatic stress disorder) 12/10/2018    Medication List:  Allergies as of 12/30/2021       Reactions   Penicillins Swelling   Did it involve swelling of the face/tongue/throat, SOB, or low BP? No Did it involve sudden or severe rash/hives, skin peeling, or any reaction on the inside of your mouth or nose? No Did you need to seek medical attention at a hospital or doctor's office? No When did it last happen?    44 years ago (age 23)   If all above answers are NO, may proceed with cephalosporin use. Swelling of arms and legs   Sulfa Antibiotics Rash   tongue        Medication List        Accurate as of December 30, 2021 11:59 PM. If you have any questions, ask your nurse or doctor.          acetaminophen 500 MG tablet Commonly known as: TYLENOL Take 500 mg by mouth as needed.   albuterol 108 (90 Base) MCG/ACT inhaler Commonly known as: VENTOLIN HFA Inhale 2 puffs into the lungs every 6 (six) hours as needed for wheezing or shortness of breath.   budesonide-formoterol 160-4.5 MCG/ACT inhaler Commonly known as: Symbicort Inhale 2 puffs into the lungs in the morning and at bedtime.   citalopram 20 MG tablet Commonly known as: CELEXA TAKE 1 TABLET BY MOUTH ONCE DAILY IN THE MORNING   pantoprazole 40 MG tablet Commonly known as: PROTONIX Take 1 tablet by mouth  daily.   propranolol 10 MG tablet Commonly known as: INDERAL TAKE 1 TABLET BY MOUTH ONCE DAILY AS NEEDED What changed: reasons to take this   Spiriva Respimat 2.5 MCG/ACT Aers Generic drug: Tiotropium Bromide Monohydrate Inhale 2 puffs into the lungs daily.        Birth History: non-contributory  Developmental History: non-contributory  Past Surgical History: Past Surgical History:  Procedure Laterality Date   ESOPHAGOGASTRODUODENOSCOPY (EGD) WITH PROPOFOL N/A 06/05/2021   Procedure: ESOPHAGOGASTRODUODENOSCOPY (EGD) WITH PROPOFOL;  Surgeon: [name], MD;  [facility] ENDOSCOPY;  Service: Endoscopy;  Laterality: N/A;   IR ANGIOGRAM FOLLOW UP STUDY  06/07/2021   IR ANGIOGRAM  SELECTIVE EACH ADDITIONAL VESSEL  06/07/2021   IR ANGIOGRAM VISCERAL SELECTIVE  06/07/2021   IR EMBO ART  VEN HEMORR LYMPH EXTRAV  INC GUIDE ROADMAPPING  06/07/2021   IR US GUIDE VASC ACCESS RIGHT  06/07/2021     Family History: History reviewed. No pertinent family history.   Social History: Lyrik lives at home with his wife. They live in a house that is 55 years old.  They have hardwood laminate flooring throughout the home.  They have gas heating and central cooling.  There are no animals inside or in the home.  There are dust mite covers on the bedding.  There is no tobacco exposure.  He is currently retired.  He is not exposed to fumes, chemicals, or dust.  There is no HEPA filter.  They do not live near an interstate or industrial area.  He was a smoker and smoked from 31 through 2022.   Review of Systems  Constitutional: Negative.  Negative for chills, fever, malaise/fatigue and weight loss.  HENT: Negative.  Negative for congestion, ear discharge, ear pain and sinus pain.   Eyes:  Negative for pain, discharge and redness.  Respiratory:  Negative for cough, sputum production, shortness of breath and wheezing.   Cardiovascular: Negative.  Negative for chest pain and palpitations.   Gastrointestinal:  Negative for abdominal pain, constipation, diarrhea, heartburn, nausea and vomiting.  Skin: Negative.  Negative for itching and rash.  Neurological:  Negative for dizziness and headaches.  Endo/Heme/Allergies:  Negative for environmental allergies. Does not bruise/bleed easily.      Objective:   Blood pressure 130/72, pulse 92, temperature 98.4 F (36.9 C), resp. rate 18, height 5\' 10"  (1.778 m), weight 176 lb 12.8 oz (80.2 kg), SpO2 92 %. Body mass index is 25.37 kg/m.   Physical Exam Vitals reviewed.  Constitutional:      Appearance: He is well-developed.     Comments: Talkative and pleasant.   HENT:     Head: Normocephalic and atraumatic.     Right Ear: Tympanic membrane, ear canal and external ear normal. No drainage, swelling or tenderness. Tympanic membrane is not injected, scarred, erythematous, retracted or bulging.     Left Ear: Tympanic membrane, ear canal and external ear normal. No drainage, swelling or tenderness. Tympanic membrane is not injected, scarred, erythematous, retracted or bulging.     Nose: No nasal deformity, septal deviation, mucosal edema or rhinorrhea.     Right Turbinates: Enlarged and swollen.     Left Turbinates: Enlarged and swollen.     Right Sinus: No maxillary sinus tenderness or frontal sinus tenderness.     Left Sinus: No maxillary sinus tenderness or frontal sinus tenderness.     Mouth/Throat:     Mouth: Mucous membranes are moist. Mucous membranes are pale and not dry.     Pharynx: Uvula midline.     Comments: Minimal cobblestoning evident.  Eyes:     General: Lids are normal. Vision grossly intact.        Right eye: No discharge.        Left eye: No discharge.     Conjunctiva/sclera: Conjunctivae normal.     Right eye: Right conjunctiva is not injected. No chemosis.    Left eye: Left conjunctiva is not injected. No chemosis.    Pupils: Pupils are equal, round, and reactive to light.  Cardiovascular:     Rate and  Rhythm: Normal rate and regular rhythm.     Heart sounds: Normal heart sounds.  Pulmonary:     Effort: Pulmonary effort is normal. No tachypnea, accessory muscle usage or respiratory distress.     Breath sounds: Normal breath sounds. No wheezing, rhonchi or rales.  Chest:     Chest wall: No tenderness.  Abdominal:     Tenderness: There is no abdominal tenderness. There is no guarding or rebound.  Lymphadenopathy:     Head:     Right side of head: No submandibular, tonsillar or occipital adenopathy.     Left side of head: No submandibular, tonsillar or occipital adenopathy.     Cervical: No cervical adenopathy.  Skin:    Coloration: Skin is not pale.     Findings: No abrasion, erythema, petechiae or rash. Rash is not papular, urticarial or vesicular.  Neurological:     Mental Status: He is alert.  Psychiatric:        Behavior: Behavior is cooperative.     Diagnostic studies: labs sent instead        Salvatore Marvel, MD [facility]

## 2022-01-05 ENCOUNTER — Other Ambulatory Visit: Payer: Self-pay | Admitting: Pulmonary Disease

## 2022-01-05 LAB — ALPHA-GAL PANEL
Allergen Lamb IgE: <0.1 kU/L
Beef IgE: <0.1 kU/L
IgE (Immunoglobulin E), Serum: 244 IU/mL (ref 6–495)
O215-IgE Alpha-Gal: <0.1 kU/L
Pork IgE: <0.1 kU/L

## 2022-01-12 LAB — CBC WITH DIFFERENTIAL
Basophils Absolute: 0.1 10*3/uL (ref 0.0–0.2)
Basos: 1 %
EOS (ABSOLUTE): 0.4 10*3/uL (ref 0.0–0.4)
Eos: 7 %
Hematocrit: 42.9 % (ref 37.5–51.0)
Hemoglobin: 13.5 g/dL (ref 13.0–17.7)
Immature Grans (Abs): 0 10*3/uL (ref 0.0–0.1)
Immature Granulocytes: 0 %
Lymphocytes Absolute: 1.5 10*3/uL (ref 0.7–3.1)
Lymphs: 22 %
MCH: 26 pg — ABNORMAL LOW (ref 26.6–33.0)
MCHC: 31.5 g/dL (ref 31.5–35.7)
MCV: 83 fL (ref 79–97)
Monocytes Absolute: 0.6 10*3/uL (ref 0.1–0.9)
Monocytes: 9 %
Neutrophils Absolute: 4.1 10*3/uL (ref 1.4–7.0)
Neutrophils: 61 %
RBC: 5.2 x10E6/uL (ref 4.14–5.80)
RDW: 15.4 % (ref 11.6–15.4)
WBC: 6.7 10*3/uL (ref 3.4–10.8)

## 2022-01-12 LAB — CMP14+EGFR
ALT: 16 IU/L (ref 0–44)
AST: 21 IU/L (ref 0–40)
Albumin/Globulin Ratio: 1.7 (ref 1.2–2.2)
Albumin: 4.2 g/dL (ref 3.7–4.7)
Alkaline Phosphatase: 86 IU/L (ref 44–121)
BUN/Creatinine Ratio: 21 (ref 10–24)
BUN: 17 mg/dL (ref 8–27)
Bilirubin Total: 0.2 mg/dL (ref 0.0–1.2)
CO2: 28 mmol/L (ref 20–29)
Calcium: 9.4 mg/dL (ref 8.6–10.2)
Chloride: 100 mmol/L (ref 96–106)
Creatinine, Ser: 0.8 mg/dL (ref 0.76–1.27)
Globulin, Total: 2.5 g/dL (ref 1.5–4.5)
Glucose: 75 mg/dL (ref 70–99)
Potassium: 5.1 mmol/L (ref 3.5–5.2)
Sodium: 139 mmol/L (ref 134–144)
Total Protein: 6.7 g/dL (ref 6.0–8.5)
eGFR: 91 mL/min/{1.73_m2} (ref >59)

## 2022-01-12 LAB — ALLERGENS W/COMP RFLX AREA 2
Alternaria Alternata IgE: <0.1 kU/L
Aspergillus Fumigatus IgE: <0.1 kU/L
Bermuda Grass IgE: <0.1 kU/L
Cedar, Mountain IgE: <0.1 kU/L
Cladosporium Herbarum IgE: <0.1 kU/L
Cockroach, German IgE: <0.1 kU/L
Common Silver Birch IgE: <0.1 kU/L
Cottonwood IgE: <0.1 kU/L
D Farinae IgE: <0.1 kU/L
D Pteronyssinus IgE: <0.1 kU/L
E001-IgE Cat Dander: <0.1 kU/L
E005-IgE Dog Dander: <0.1 kU/L
Elm, American IgE: <0.1 kU/L
IgE (Immunoglobulin E), Serum: 230 IU/mL (ref 6–495)
Johnson Grass IgE: <0.1 kU/L
Maple/Box Elder IgE: <0.1 kU/L
Mouse Urine IgE: <0.1 kU/L
Oak, White IgE: <0.1 kU/L
Pecan, Hickory IgE: <0.1 kU/L
Penicillium Chrysogen IgE: <0.1 kU/L
Pigweed, Rough IgE: <0.1 kU/L
Ragweed, Short IgE: <0.1 kU/L
Sheep Sorrel IgE Qn: <0.1 kU/L
Timothy Grass IgE: <0.1 kU/L
White Mulberry IgE: <0.1 kU/L

## 2022-01-12 LAB — C-REACTIVE PROTEIN: CRP: <1 mg/L (ref 0–10)

## 2022-01-12 LAB — TRYPTASE: Tryptase: 10.7 ug/L (ref 2.2–13.2)

## 2022-01-12 LAB — CHRONIC URTICARIA: cu index: 2.5 (ref <10)

## 2022-01-12 LAB — THYROID ANTIBODIES
Thyroglobulin Antibody: <1 IU/mL (ref 0.0–0.9)
Thyroperoxidase Ab SerPl-aCnc: <9 IU/mL (ref 0–34)

## 2022-01-12 LAB — ANTINUCLEAR ANTIBODIES, IFA: ANA Titer 1: NEGATIVE

## 2022-01-12 LAB — SEDIMENTATION RATE: Sed Rate: 18 mm/hr (ref 0–30)

## 2022-01-20 ENCOUNTER — Encounter: Payer: Self-pay | Admitting: Adult Health

## 2022-01-20 ENCOUNTER — Ambulatory Visit: Payer: Medicare PPO | Admitting: Adult Health

## 2022-01-20 VITALS — BP 117/66 | HR 92 | Ht 70.0 in | Wt 177.0 lb

## 2022-01-20 DIAGNOSIS — I639 Cerebral infarction, unspecified: Secondary | ICD-10-CM | POA: Diagnosis not present

## 2022-01-20 DIAGNOSIS — I6522 Occlusion and stenosis of left carotid artery: Secondary | ICD-10-CM

## 2022-01-20 DIAGNOSIS — G459 Transient cerebral ischemic attack, unspecified: Secondary | ICD-10-CM | POA: Diagnosis not present

## 2022-01-20 NOTE — Progress Notes (Signed)
[facility] [address] [place]. [place] [id number] [phone]       STROKE FOLLOW UP NOTE  Mr. [name] Date of Birth:  [birth year] Medical Record Number:  [id number]    Primary neurologist: Dr. Leonie Man Reason for Referral: stroke follow up    SUBJECTIVE:   CHIEF COMPLAINT:  Chief Complaint  Patient presents with   Follow-up    Rm 3 with spouse Martin Mendez PT is well and stable, no new concerns     HPI:   Update 01/20/2022 Martin Mendez: 76 year old male who returns today for stroke follow-up after prior visit 6 months ago.  He is accompanied by his wife.  Overall stable without new or reoccurring stroke/TIA symptoms.  No residual stroke deficits.  Blood pressure today 117/66, monitors at home which has been stable. Remains resistant to statins. Has had f/u with GI for hx of GIB, no additional bleeding or GIB concerns. Per patient, GI provider did not want him starting on any type of blood thinning medications including aspirin. He has since been seen by PCP.  No new concerns at this time.    History provided for reference purposes only Initial visit 07/22/2021 Martin Mendez: [name] is being seen for hospital follow-up accompanied by his wife.  Overall doing well.  Denies new or reoccurring stroke/TIA symptoms.  Blood pressure today 122/74.  Routinely monitors at home which has been stable.  No additional bleeding concerns or symptoms and has f/u with GI Dr. Paulita Mendez on 10/13.  PCP appointment 11/1 to establish care with Dr. Lysle Mendez as his prior PCP will be retiring. Wife has multiple appropriate questions regarding recent stroke and possible etiology, imaging and recommended treatment options.  No further concerns at this time.  Stroke admission 06/05/2021 Martin Mendez is a 76 year old right-handed male with history of COPD, smoker admitted on 06/05/2021 for upper GI bleeding status post GDA embolization.  Right before discharge on 7/28, patient had episode of aphasia, difficulty  getting words out, patient feeling " tongue thickness", lasted about 5 to 10 minutes and resolved.  MRI showed right frontal small infarct which likely not able to explain patient symptoms and concern of possible left-sided TIA.  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  LE venous Doppler negative for DVT.  EF 55 to 60%.  LDL 77, A1c 5.7.  Hemoglobin 10.0, stable, creatinine 0.62.  Discussed with GI, patient not a candidate for advanced antithrombotic at that time therefore recommended aspirin 81 mg daily but patient declined in setting of recent GI bleed.  LDL 77 -declined statin use.  HTN stable on Inderal.  Other stroke risk factors include advanced age, tobacco use and family history of stroke.  Therapy evaluation with no therapy needs.    PERTINENT IMAGING  MR BRAIN 06/11/2021 IMPRESSION: 1. Small cortical focus of restricted diffusion within the right frontal lobe consistent with acute/subacute infarct. 2. Moderate chronic small vessel ischemic changes of the white matter.   CT HEAD 06/12/2019 CTA HEAD/NECK 06/11/2021 IMPRESSION: CT head:   1. Small cortical infarct in the right frontal lobe better seen on same day MRI. 2. No evidence of other/interval acute intracranial abnormality. 3. Mild to moderate chronic microvascular changes.   CTA Head:   No large vessel occlusion or proximal hemodynamically significant stenosis.   CTA Neck:   1. Bilateral carotid bifurcation atherosclerosis with approximately 50% stenosis of the left internal carotid artery origin. 2. Severe emphysema.       ROS:  14 system review of systems performed and negative with exception of those listed in HPI  PMH:  Past Medical History:  Diagnosis Date   COPD (chronic obstructive pulmonary disease) (HCC)    PTSD (post-traumatic stress disorder)     PSH:  Past Surgical History:  Procedure Laterality Date   ESOPHAGOGASTRODUODENOSCOPY (EGD) WITH PROPOFOL N/A 06/05/2021   Procedure:  ESOPHAGOGASTRODUODENOSCOPY (EGD) WITH PROPOFOL;  Surgeon: Martin Silence, MD;  Location: [facility];  Service: Endoscopy;  Laterality: N/A;   IR ANGIOGRAM FOLLOW UP STUDY  06/07/2021   IR ANGIOGRAM SELECTIVE EACH ADDITIONAL VESSEL  06/07/2021   IR ANGIOGRAM VISCERAL SELECTIVE  06/07/2021   IR EMBO ART  VEN HEMORR LYMPH EXTRAV  INC GUIDE ROADMAPPING  06/07/2021   IR US GUIDE VASC ACCESS RIGHT  06/07/2021    Social History:  Social History   Socioeconomic History   Marital status: Single    Spouse name: Not on file   Number of children: Not on file   Years of education: Not on file   Highest education level: Not on file  Occupational History   Not on file  Tobacco Use   Smoking status: Former    Packs/day: 1.00    Years: 60.00    Pack years: 60.00    Types: Cigarettes    Quit date: 06/04/2021    Years since quitting: 0.6   Smokeless tobacco: Never  Substance and Sexual Activity   Alcohol use: No   Drug use: No   Sexual activity: Not on file  Other Topics Concern   Not on file  Social History Narrative   Not on file   Social Determinants of Health   Financial Resource Strain: Not on file  Food Insecurity: Not on file  Transportation Needs: Not on file  Physical Activity: Not on file  Stress: Not on file  Social Connections: Not on file  Intimate Partner Violence: Not on file    Family History: History reviewed. No pertinent family history.  Medications:   Current Outpatient Medications on File Prior to Visit  Medication Sig Dispense Refill   acetaminophen (TYLENOL) 500 MG tablet Take 500 mg by mouth as needed.     albuterol (VENTOLIN HFA) 108 (90 Base) MCG/ACT inhaler Inhale 2 puffs into the lungs every 6 (six) hours as needed for wheezing or shortness of breath. 8 g 2   budesonide-formoterol (SYMBICORT) 160-4.5 MCG/ACT inhaler Inhale 2 puffs into the lungs in the morning and at bedtime. 1 each 3   citalopram (CELEXA) 20 MG tablet TAKE 1 TABLET BY MOUTH ONCE DAILY IN  THE MORNING 90 tablet 1   Multiple Vitamin (MULTI VITAMIN DAILY PO) Take by mouth.     propranolol (INDERAL) 10 MG tablet TAKE 1 TABLET BY MOUTH ONCE DAILY AS NEEDED (Patient taking differently: Take 10 mg by mouth daily as needed (anxiety).) 30 tablet 0   SPIRIVA RESPIMAT 2.5 MCG/ACT AERS INHALE 2 PUFFS BY MOUTH INTO THE LUNGS DAILY 4 g 6   No current facility-administered medications on file prior to visit.    Allergies:   Allergies  Allergen Reactions   Penicillins Swelling    Did it involve swelling of the face/tongue/throat, SOB, or low BP? No Did it involve sudden or severe rash/hives, skin peeling, or any reaction on the inside of your mouth or nose? No Did you need to seek medical attention at a hospital or doctor's office? No When did it last happen?    63 years ago (age 30)  If all above answers are NO, may proceed with cephalosporin use.  Swelling of arms and legs   Sulfa Antibiotics Rash    tongue      OBJECTIVE:  Physical Exam  Vitals:   01/20/22 1431  BP: 117/66  Pulse: 92  Weight: 177 lb (80.3 kg)  Height: 5\' 10"  (1.778 m)    Body mass index is 25.4 kg/m. No results found.   General: well developed, well nourished, very pleasant elderly Caucasian male, seated, in no evident distress Head: head normocephalic and atraumatic.   Neck: supple with no carotid or supraclavicular bruits Cardiovascular: regular rate and rhythm, no murmurs Musculoskeletal: no deformity Skin:  no rash/petichiae Vascular:  Normal pulses all extremities   Neurologic Exam Mental Status: Awake and fully alert.  Fluent speech and language.  Oriented to place and time. Recent and remote memory intact. Attention span, concentration and fund of knowledge appropriate. Mood and affect appropriate.  Cranial Nerves: Pupils equal, briskly reactive to light. Extraocular movements full without nystagmus. Visual fields full to confrontation. Hearing intact. Facial sensation intact. Face,  tongue, palate moves normally and symmetrically.  Motor: Normal bulk and tone. Normal strength in all tested extremity muscles Sensory.: intact to touch , pinprick , position and vibratory sensation.  Coordination: Rapid alternating movements normal in all extremities. Finger-to-nose and heel-to-shin performed accurately bilaterally. Gait and Station: Arises from chair without difficulty. Stance is normal. Gait demonstrates normal stride length and balance without use of assistive device.  Reflexes: 1+ and symmetric. Toes downgoing.         ASSESSMENT: Martin Mendez is a 76 y.o. year old male with recent right frontal stroke and likely left brain TIA with concern of embolic etiology on 0/86/5784 with 5 to 10-minute episode of aphasia during admission for GI bleed initially admitted on 06/05/2021. Vascular risk factors include left ICA 50% stenosis, HTN, tobacco use and advanced age.      PLAN:  R frontal stroke, ?L TIA :  Recovered without residual deficit Per patient, not cleared to be on aspirin or any type of blood thinning agents per GI recommendation, would recommend if cleared by GI in the future for at least aspirin to start on low dose 81mg  daily for secondary stroke prevention measures. If ever cleared to stronger blood thinning agents such as Eliquis, can pursue further cardiac monitoring but currently no indication if not an AC candidate.  Patient refuses statin, again discussed indication for statin therapy and lowering risk of recurrent stroke.  He remains reluctant and plans on further discussing with PCP Discussed secondary stroke prevention measures and importance of close PCP follow up for aggressive stroke risk factor management including HTN with BP goal<130/90. I have gone over the pathophysiology of stroke, warning signs and symptoms, risk factors and their management in some detail with instructions to go to the closest emergency room for symptoms of concern. Left ICA  stenosis:  Carotid ultrasound 08/2021 bilateral ICA 1 to 39% stenosis CTA 05/2021 left ICA 50% stenosis Repeat carotid ultrasound 08/2022, if remains stable, can continue to be monitored by PCP HTN: BP goal <130/90.  Stable on current regimen per PCP    Doing well from stroke standpoint and risk factors are managed by PCP. She may follow up PRN, as usual for our patients who are strictly being followed for stroke. If any new neurological issues should arise, request PCP place referral for evaluation by one of our neurologists. Thank you.     CC:  PCP: Wenda Low, MD    I spent 36 minutes of face-to-face and non-face-to-face time with patient and wife.  This included previsit chart review, lab review, study review, electronic health record documentation, patient and wife education regarding prior stroke including etiology, secondary stroke prevention measures and importance of managing stroke risk factors and answered all other questions to patient and wifes satisfaction  Frann Rider, AGNP-BC  [facility] [address] [place], [place] [address]  Phone [phone] Fax [phone] Note: This document was prepared with digital dictation and possible smart phrase technology. Any transcriptional errors that result from this process are unintentional.

## 2022-01-20 NOTE — Patient Instructions (Addendum)
Continue to follow with your GI doctor - if able to restart blood thinning medications, would recommend restarting aspirin 81mg  daily. Can also consider further cardiac evaluation if able to ever be on stronger blood thinning medications  ? ?Continue to follow up with PCP regarding cholesterol and blood pressure management  ?Maintain strict control of hypertension with blood pressure goal below 130/90 and cholesterol with LDL cholesterol (bad cholesterol) goal below 70 mg/dL.  ? ?Signs of a Stroke? Follow the BEFAST method:  ?Balance Watch for a sudden loss of balance, trouble with coordination or vertigo ?Eyes Is there a sudden loss of vision in one or both eyes? Or double vision?  ?Face: Ask the person to smile. Does one side of the face droop or is it numb?  ?Arms: Ask the person to raise both arms. Does one arm drift downward? Is there weakness or numbness of a leg? ?Speech: Ask the person to repeat a simple phrase. Does the speech sound slurred/strange? Is the person confused ? ?Time: If you observe any of these signs, call 911. ? ?You will be called in October to repeat a carotid ultrasound to monitor for carotid stenosis  ? ? ? ? ? ?Thank you for coming to see [place] at [facility]. I hope we have been able to provide you high quality care today. ? ?You may receive a patient satisfaction survey over the next few weeks. We would appreciate your feedback and comments so that we may continue to improve ourselves and the health of our patients. ? ?

## 2022-02-15 ENCOUNTER — Encounter: Payer: Self-pay | Admitting: Allergy & Immunology

## 2022-02-15 ENCOUNTER — Ambulatory Visit: Payer: Medicare PPO | Admitting: Allergy & Immunology

## 2022-02-15 VITALS — BP 120/70 | HR 88 | Temp 98.4°F | Resp 16 | Ht 70.0 in | Wt 178.5 lb

## 2022-02-15 DIAGNOSIS — J4489 Other specified chronic obstructive pulmonary disease: Secondary | ICD-10-CM

## 2022-02-15 DIAGNOSIS — J449 Chronic obstructive pulmonary disease, unspecified: Secondary | ICD-10-CM | POA: Diagnosis not present

## 2022-02-15 DIAGNOSIS — L298 Other pruritus: Secondary | ICD-10-CM | POA: Diagnosis not present

## 2022-02-15 DIAGNOSIS — T50905D Adverse effect of unspecified drugs, medicaments and biological substances, subsequent encounter: Secondary | ICD-10-CM

## 2022-02-15 DIAGNOSIS — L2989 Other pruritus: Secondary | ICD-10-CM

## 2022-02-15 NOTE — Patient Instructions (Addendum)
1. Aquagenic pruritus ?- I think the addition of the Dupixent could be helpful in managing your itching. ?- We would notice an improvement within 3 injections, I would think.   ?- Labs were all negative.  ? ?2. Adverse effect of drug ?- I would continue to avoid the PPIs for now. ?- I would consider using H2 blockers such as Pepcid (famotidine) in the future if Dr. Paulita Fujita feels that this might be helpful. ?- I will defer this to you and Dr. Paulita Fujita.  ? ?3. Asthma with COPD overlap ?- Lung testing was in the 26% range, but it did improve with the albuterol. ?- We are going to try to get Dupixent approved for your breathing since it may also help your asthma. ?- Daily controller medication(s): Spiriva 2.4mcg two puffs once daily ?- Prior to physical activity: albuterol 2 puffs 10-15 minutes before physical activity. ?- Rescue medications: albuterol 4 puffs every 4-6 hours as needed ?- Changes during respiratory infections or worsening symptoms: Add on Symbicort 160/4.53mcg to 2 puffs twice daily for TWO WEEKS. ?- Asthma control goals:  ?* Full participation in all desired activities (may need albuterol before activity) ?* Albuterol use two time or less a week on average (not counting use with activity) ?* Cough interfering with sleep two time or less a month ?* Oral steroids no more than once a year ?* No hospitalizations ? ?4.  Return in about 2 months (around 04/17/2022).  ? ? ?Please inform us of any Emergency Department visits, hospitalizations, or changes in symptoms. Call us before going to the ED for breathing or allergy symptoms since we might be able to fit you in for a sick visit. Feel free to contact us anytime with any questions, problems, or concerns. ? ?It was a pleasure to meet you and your bride today! ? ?Websites that have reliable patient information: ?1. American Academy of Asthma, Allergy, and Immunology: www.aaaai.org ?2. Food Allergy Research and Education (FARE): foodallergy.org ?3. Mothers of  Asthmatics: http://www.asthmacommunitynetwork.org ?4. [employer] of Allergy, Asthma, and Immunology: MonthlyElectricBill.co.uk ? ? ?COVID-19 Vaccine Information can be found at: ShippingScam.co.uk For questions related to vaccine distribution or appointments, please email [email or web address] or call [phone].  ? ?We realize that you might be concerned about having an allergic reaction to the COVID19 vaccines. To help with that concern, WE ARE OFFERING THE COVID19 VACCINES IN OUR OFFICE! Ask the front desk for dates!  ? ? ? ??Like? [place] on Facebook and Instagram for our latest updates!  ?  ? ? ?A healthy democracy works best when [employer] participate! Make sure you are registered to vote! If you have moved or changed any of your contact information, you will need to get this updated before voting! ? ?In some cases, you MAY be able to register to vote online: [email or web address] ? ? ? ? ? ? ? ? ? ? ?

## 2022-02-15 NOTE — Progress Notes (Signed)
? ?FOLLOW UP ? ?Date of Service/Encounter:  02/15/22 ? ? ?Assessment:  ? ?Aquagenic pruritus - looking into adding on Dupixent ?  ?Adverse effect of drug - declines any GI drugs anyway ?  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 ? ?[name] presents for follow-up visit.  He is adamantly opposed to starting any GI medications despite his history of a duodenal bleed.  I will defer this decision to me and Dr. Trenton Mendez.  I did offer the chance for him to try an H2 blocker such as Pepcid which is not even in the PPI family, but he was not interested in considering that at all.  His breathing did not look great today.  His FEV1 was in the mid 20% range, but did improve significantly with the Xopenex treatment.  We are going to look into starting Dupixent which I think might help his breathing as well as his itching.  He is open to giving this a try.  His elevated eosinophil counts from February should be enough to get this approved. ? ?Plan/Recommendations:  ? ?1. Aquagenic pruritus ?- I think the addition of the Dupixent could be helpful in managing your itching. ?- We would notice an improvement within 3 injections, I would think.   ?- Labs were all negative.  ? ?2. Adverse effect of drug ?- I would continue to avoid the PPIs for now. ?- I would consider using H2 blockers such as Pepcid (famotidine) in the future if Dr. Paulita Mendez feels that this might be helpful. ?- I will defer this to you and Dr. Paulita Mendez.  ? ?3. Asthma with COPD overlap ?- Lung testing was in the 26% range, but it did improve with the albuterol. ?- We are going to try to get Dupixent approved for your breathing since it may also help your asthma. ?- Daily controller medication(s): Spiriva 2.55mcg two puffs once daily ?- Prior to physical activity: albuterol 2 puffs 10-15 minutes before physical activity. ?- Rescue medications: albuterol 4 puffs every 4-6 hours as needed ?- Changes  during respiratory infections or worsening symptoms: Add on Symbicort 160/4.29mcg to 2 puffs twice daily for TWO WEEKS. ?- Asthma control goals:  ?* Full participation in all desired activities (may need albuterol before activity) ?* Albuterol use two time or less a week on average (not counting use with activity) ?* Cough interfering with sleep two time or less a month ?* Oral steroids no more than once a year ?* No hospitalizations ? ?4.  Return in about 2 months (around 04/17/2022).  ? ?Subjective:  ? ?Martin Mendez is a 76 y.o. male presenting today for follow up of  ?Chief Complaint  ?Patient presents with  ? Follow-up  ? ? ?Martin Mendez has a history of the following: ?Patient Active Problem List  ? Diagnosis Date Noted  ? Chronic respiratory failure with hypercapnia ([place]) 07/08/2021  ? Cerebral embolism with cerebral infarction 06/11/2021  ? Syncope 10/10/2019  ? Chronic obstructive pulmonary disease ([place]) 12/13/2018  ? Hyponatremia 12/11/2018  ? Former smoker 12/10/2018  ? PTSD (post-traumatic stress disorder) 12/10/2018  ? ? ?History obtained from: chart review and patient. ? ?Martin Mendez is a 76 y.o. male presenting for a follow up visit.  ? ?Since the last visit, he has continued to have itching.  ? ?He has stopped the Pepcid and the Protonix. He is not having acid reflux or indigestion. He does not want to  have the splotches. He had not been taking the the Prilosec or Protonix.  ? ?Asthma/Respiratory Symptom History: He remains on the Spiriva. He does become winded with quick walking.  He has not been on anything else aside from the [place]. It all depends on what he is doing. He does not do enough activity every day to trigger the windedness. He is on Symbicort two puffs BID, but has changed it to PRN. He used twice in the last week.  He does not notice that it improves anything at all when he does use it.  He has not been on prednisone and has not been to the emergency room for breathing.  He thinks he has a  follow-up with Dr. Loanne Mendez in late 2023.   ? ?Skin Symptom History: Itching lasts upwards of one hour after taking showers. It does not matter the temperature of the shower, but he does not take hot showers anyway.  He has tried every antihistamine under the sun and it did not do any good, including Claritin, Zyrtec, Allegra, and Xyzal.  He does not seem particularly interested in talking about other antihistamine options.  He is open to the idea of starting [place] ? ?GERD Symptom History: He does not have a set appointment to see Dr. Paulita Mendez again.  He does feel that he needs it at all. He takes one Tums as needed.  He is adamantly opposed to starting any medications for reflux or GI prophylaxis at all. ? ?Otherwise, there have been no changes to his past medical history, surgical history, family history, or social history. ? ? ? ?Review of Systems  ?Constitutional: Negative.  Negative for chills, fever, malaise/fatigue and weight loss.  ?HENT: Negative.  Negative for congestion, ear discharge, ear pain and sinus pain.   ?Eyes:  Negative for pain, discharge and redness.  ?Respiratory:  Negative for cough, sputum production, shortness of breath and wheezing.   ?Cardiovascular: Negative.  Negative for chest pain and palpitations.  ?Gastrointestinal:  Negative for abdominal pain, constipation, diarrhea, heartburn, nausea and vomiting.  ?Skin:  Positive for itching. Negative for rash.  ?Neurological:  Negative for dizziness and headaches.  ?Endo/Heme/Allergies:  Negative for environmental allergies. Does not bruise/bleed easily.   ? ? ? ?Objective:  ? ?Blood pressure 120/70, pulse 88, temperature 98.4 ?F (36.9 ?C), resp. rate 16, height 5\' 10"  (1.778 m), weight 178 lb 8 oz (81 kg), SpO2 92 %. ?Body mass index is 25.61 kg/m?. ? ? ? ?Physical Exam ?Vitals reviewed.  ?Constitutional:   ?   Appearance: He is well-developed.  ?   Comments: Talkative and pleasant.   ?HENT:  ?   Head: Normocephalic and atraumatic.  ?   Right  Ear: Tympanic membrane, ear canal and external ear normal. No drainage, swelling or tenderness. Tympanic membrane is not injected, scarred, erythematous, retracted or bulging.  ?   Left Ear: Tympanic membrane, ear canal and external ear normal. No drainage, swelling or tenderness. Tympanic membrane is not injected, scarred, erythematous, retracted or bulging.  ?   Nose: No nasal deformity, septal deviation, mucosal edema or rhinorrhea.  ?   Right Turbinates: Enlarged, swollen and pale.  ?   Left Turbinates: Enlarged, swollen and pale.  ?   Right Sinus: No maxillary sinus tenderness or frontal sinus tenderness.  ?   Left Sinus: No maxillary sinus tenderness or frontal sinus tenderness.  ?   Mouth/Throat:  ?   Mouth: Mucous membranes are moist. Mucous membranes are pale and not  dry.  ?   Pharynx: Uvula midline.  ?   Comments: Minimal cobblestoning evident.  ?Eyes:  ?   General: Lids are normal. Vision grossly intact. Allergic shiner present.     ?   Right eye: No discharge.     ?   Left eye: No discharge.  ?   Conjunctiva/sclera: Conjunctivae normal.  ?   Right eye: Right conjunctiva is not injected. No chemosis. ?   Left eye: Left conjunctiva is not injected. No chemosis. ?   Pupils: Pupils are equal, round, and reactive to light.  ?Cardiovascular:  ?   Rate and Rhythm: Normal rate and regular rhythm.  ?   Heart sounds: Normal heart sounds.  ?Pulmonary:  ?   Effort: Pulmonary effort is normal. No tachypnea, accessory muscle usage or respiratory distress.  ?   Breath sounds: Normal breath sounds. No wheezing, rhonchi or rales.  ?Chest:  ?   Chest wall: No tenderness.  ?Abdominal:  ?   Tenderness: There is no abdominal tenderness. There is no guarding or rebound.  ?Lymphadenopathy:  ?   Head:  ?   Right side of head: No submandibular, tonsillar or occipital adenopathy.  ?   Left side of head: No submandibular, tonsillar or occipital adenopathy.  ?   Cervical: No cervical adenopathy.  ?Skin: ?   Coloration: Skin is not  pale.  ?   Findings: No abrasion, erythema, petechiae or rash. Rash is not papular, urticarial or vesicular.  ?   Comments: No urticaria or excoriations.  ?Neurological:  ?   Mental Status: He is al

## 2022-02-16 NOTE — Addendum Note (Signed)
Addended by: Jacqualin Combes on: 02/16/2022 12:34 PM ? ? Modules accepted: Orders ? ?

## 2022-02-22 ENCOUNTER — Telehealth: Payer: Self-pay | Admitting: *Deleted

## 2022-02-22 NOTE — Telephone Encounter (Signed)
-----   Message from Valentina Shaggy, MD sent at 02/15/2022  3:39 PM EDT ----- ?Dupixent for asthma ?

## 2022-02-22 NOTE — Telephone Encounter (Signed)
Called patient and discussed Dupixent through patient assistance due to Ins and will mail app for patient to return to me to start process to get free drug ?

## 2022-02-23 NOTE — Telephone Encounter (Signed)
Awesome sauce. Thanks!  ? ?Salvatore Marvel, MD ?[facility] [place] [facility] ? ?

## 2022-03-14 ENCOUNTER — Telehealth: Payer: Self-pay | Admitting: Pulmonary Disease

## 2022-03-15 MED ORDER — ALBUTEROL SULFATE HFA 108 (90 BASE) MCG/ACT IN AERS: 2.0000 | INHALATION_SPRAY | Freq: Four times a day (QID) | RESPIRATORY_TRACT | 2 refills | Status: DC | PRN

## 2022-03-15 NOTE — Telephone Encounter (Signed)
I have refilled the albuterol  ?LMOM informing the pt that this was done ?

## 2022-03-25 ENCOUNTER — Other Ambulatory Visit: Payer: Self-pay | Admitting: Physician Assistant

## 2022-04-06 DIAGNOSIS — F4310 Post-traumatic stress disorder, unspecified: Secondary | ICD-10-CM | POA: Diagnosis not present

## 2022-04-06 DIAGNOSIS — Z8673 Personal history of transient ischemic attack (TIA), and cerebral infarction without residual deficits: Secondary | ICD-10-CM | POA: Diagnosis not present

## 2022-04-06 DIAGNOSIS — Z8719 Personal history of other diseases of the digestive system: Secondary | ICD-10-CM | POA: Diagnosis not present

## 2022-04-06 DIAGNOSIS — R7303 Prediabetes: Secondary | ICD-10-CM | POA: Diagnosis not present

## 2022-04-06 DIAGNOSIS — I700 Atherosclerosis of aorta: Secondary | ICD-10-CM | POA: Diagnosis not present

## 2022-04-06 DIAGNOSIS — L509 Urticaria, unspecified: Secondary | ICD-10-CM | POA: Diagnosis not present

## 2022-04-06 DIAGNOSIS — Z23 Encounter for immunization: Secondary | ICD-10-CM | POA: Diagnosis not present

## 2022-04-06 DIAGNOSIS — J449 Chronic obstructive pulmonary disease, unspecified: Secondary | ICD-10-CM | POA: Diagnosis not present

## 2022-04-12 ENCOUNTER — Other Ambulatory Visit: Payer: Self-pay | Admitting: *Deleted

## 2022-04-12 MED ORDER — DUPIXENT 300 MG/2ML ~~LOC~~ SOSY: 300.0000 mg | PREFILLED_SYRINGE | Freq: Once | SUBCUTANEOUS | 11 refills | Status: AC

## 2022-04-19 ENCOUNTER — Ambulatory Visit: Payer: Medicare PPO | Admitting: Allergy & Immunology

## 2022-04-19 ENCOUNTER — Encounter: Payer: Self-pay | Admitting: Allergy & Immunology

## 2022-04-19 VITALS — BP 118/64 | HR 78 | Temp 98.5°F | Resp 18 | Ht 70.0 in | Wt 177.2 lb

## 2022-04-19 DIAGNOSIS — T50905D Adverse effect of unspecified drugs, medicaments and biological substances, subsequent encounter: Secondary | ICD-10-CM

## 2022-04-19 DIAGNOSIS — J449 Chronic obstructive pulmonary disease, unspecified: Secondary | ICD-10-CM

## 2022-04-19 DIAGNOSIS — L298 Other pruritus: Secondary | ICD-10-CM

## 2022-04-19 DIAGNOSIS — L209 Atopic dermatitis, unspecified: Secondary | ICD-10-CM | POA: Diagnosis not present

## 2022-04-19 MED ORDER — DUPILUMAB 300 MG/2ML ~~LOC~~ SOSY
300.0000 mg | PREFILLED_SYRINGE | SUBCUTANEOUS | Status: DC
  Administered 2022-04-19 – 2022-05-05 (×2): 300 mg via SUBCUTANEOUS

## 2022-04-19 NOTE — Progress Notes (Unsigned)
Immunotherapy   Patient Details  Name: Martin Mendez MRN: [id number] Date of Birth: Apr 08, 1946  04/19/2022  Rose Fillers Wolters started injections for  Dupixent Frequency: Every two weeks. Epi-Pen: Not needed Consent signed and patient instructions given. Patient waited in the lobby for thirty minutes without an issue.    Julius Bowels 04/19/2022, 7:36 PM

## 2022-04-19 NOTE — Patient Instructions (Addendum)
1. Aquagenic pruritus - Call Tammy about your income verification so we can get free drug: [phone] - We would notice an improvement within 3 injections, I would think.   - Labs were all negative.   2. Asthma with COPD overlap - Lung testing looked stable at 24%.  - Dupixent given.  - Daily controller medication(s): Spiriva 2.31mcg two puffs once daily - Prior to physical activity: albuterol 2 puffs 10-15 minutes before physical activity. - Rescue medications: albuterol 4 puffs every 4-6 hours as needed - Changes during respiratory infections or worsening symptoms: Add on Symbicort 160/4.28mcg to 2 puffs twice daily for TWO WEEKS. - Asthma control goals:  * Full participation in all desired activities (may need albuterol before activity) * Albuterol use two time or less a week on average (not counting use with activity) * Cough interfering with sleep two time or less a month * Oral steroids no more than once a year * No hospitalizations  3  Return in about 3 months (around 07/20/2022).    Please inform us of any Emergency Department visits, hospitalizations, or changes in symptoms. Call us before going to the ED for breathing or allergy symptoms since we might be able to fit you in for a sick visit. Feel free to contact us anytime with any questions, problems, or concerns.  It was a pleasure to see you and your wife today!   Websites that have reliable patient information: 1. American Academy of Asthma, Allergy, and Immunology: www.aaaai.org 2. Food Allergy Research and Education (FARE): foodallergy.org 3. Mothers of Asthmatics: http://www.asthmacommunitynetwork.org 4. American College of Allergy, Asthma, and Immunology: www.acaai.org   COVID-19 Vaccine Information can be found at: ShippingScam.co.uk For questions related to vaccine distribution or appointments, please email [email or web address] or call [phone].   We  realize that you might be concerned about having an allergic reaction to the COVID19 vaccines. To help with that concern, WE ARE OFFERING THE COVID19 VACCINES IN OUR OFFICE! Ask the front desk for dates!     "Like" [place] on Facebook and Instagram for our latest updates!      A healthy democracy works best when [employer] participate! Make sure you are registered to vote! If you have moved or changed any of your contact information, you will need to get this updated before voting!  In some cases, you MAY be able to register to vote online: [email or web address]

## 2022-04-19 NOTE — Progress Notes (Unsigned)
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 Aquagenic pruritus - Call Tammy about your income verification so we can get free drug: [phone] - We would notice an improvement within 3 injections, I would think.   - Labs were all negative.   2. Asthma with COPD overlap - Lung testing looked stable at 24%.  - Dupixent given.  - Daily controller medication(s): Spiriva 2.20mcg two puffs once daily - Prior to physical activity: albuterol 2 puffs 10-15 minutes before physical activity. - Rescue medications: albuterol 4 puffs every 4-6 hours as needed - Changes during respiratory infections or worsening symptoms: Add on Symbicort 160/4.9mcg to 2 puffs twice daily for TWO WEEKS. - Asthma control goals:  * Full participation in all desired activities (may need albuterol before activity) * Albuterol use two time or less a week on average (not counting use with activity) * Cough interfering with sleep two time or less a month * Oral steroids no more than once a year * No hospitalizations  3  Return in about 3 months (around 07/20/2022).    Please inform us of any Emergency Department visits, hospitalizations, or changes in symptoms. Call us before going to the ED for breathing or allergy symptoms since we might be able to fit you in for a sick visit. Feel free to contact us anytime with any questions, problems, or concerns.  It was a pleasure to see you and your wife today!   Websites that have reliable patient information: 1. American Academy of Asthma, Allergy, and Immunology: www.aaaai.org 2. Food Allergy Research and Education (FARE): foodallergy.org 3. Mothers of Asthmatics:  http://www.asthmacommunitynetwork.org 4. American College of Allergy, Asthma, and Immunology: www.acaai.org   COVID-19 Vaccine Information can be found at: ShippingScam.co.uk For questions related to vaccine distribution or appointments, please email [email or web address] or call [phone].   We realize that you might be concerned about having an allergic reaction to the COVID19 vaccines. To help with that concern, WE ARE OFFERING THE COVID19 VACCINES IN OUR OFFICE! Ask the front desk for dates!     "Like" [place] on Facebook and Instagram for our latest updates!      A healthy democracy works best when [employer] participate! Make sure you are registered to vote! If you have moved or changed any of your contact information, you will need to get this updated before voting!  In some cases, you MAY be able to register to vote online: [email or web address]             Subjective:   NORVAL SLAVEN is a 76 y.o. male presenting today for follow up of  Chief Complaint  Patient presents with   Follow-up    OLIVIA ROYSE has a history of the following: Patient Active Problem List   Diagnosis Date Noted   Chronic respiratory failure with hypercapnia ([place]) 07/08/2021   Cerebral embolism with cerebral infarction 06/11/2021   Syncope 10/10/2019   Chronic obstructive pulmonary disease ([place]) 12/13/2018   Hyponatremia 12/11/2018   Former smoker 12/10/2018   PTSD (post-traumatic stress disorder) 12/10/2018    History obtained from: chart review and {Persons; PED relatives w/patient:19415::"patient"}.  Ailton is a 76 y.o. male presenting for {  Blank single:19197::"a food challenge","a drug challenge","skin testing","a sick visit","an evaluation of ***","a follow up visit"}.  He was last seen in April 2023.  At that time, he was endorsing symptoms of aqua genic pruritus which we hoped would be helped by  the [place].  We recommended avoiding all of his PPIs.  We talked about adding an H2 blocker for now given his history of a duodenal ulcer, but he adamantly refused.  For his asthma with COPD overlap, his lung testing was in the 26% range but improved with albuterol.  We continue Spiriva 2.5 mcg 2 puffs once daily.  He had Symbicort for 1 to 2 weeks during flares.  We did get him approved for Dupixent.  Since last visit, he has done well. He brought in Gold[place] today from [employer].   Asthma/Respiratory Symptom History: He remains on the Spiriva two puffs once daily. He did start taking original Mucinex to help with mucous clearance. This helps him to get it up.  He takes one every 12 hours. He had been on the Symbicort and this did not seem to help him. He did restart it. He could not tell a whole lot of improvement with this. He continues to follow with Dr. Loanne Drilling, next appointment in August 2023. He denies nighttime coughing. He does have dry mouth. This is likely related to his oxygen that he uses at night.   {Blank single:19197::"Allergic Rhinitis Symptom History: ***"," "}  {Blank single:19197::"Food Allergy Symptom History: ***"," "}  Skin Symptom History: He does have the itching with exposure to water. He does have itching with cold air exposure as well over his skin.  GERD Symptom History: He has had no problem with the heartburn.   Did get his shingles shot on May 24th, He has a second one scheduled.   Otherwise, there have been no changes to his past medical history, surgical history, family history, or social history.    ROS     Objective:   Blood pressure 118/64, pulse 78, temperature 98.5 F (36.9 C), resp. rate 18, height 5\' 10"  (1.778 m), weight 177 lb 4 oz (80.4 kg), SpO2 94 %. Body mass index is 25.43 kg/m.    Physical Exam   Diagnostic studies:    Spirometry: results abnormal (FEV1: 0.72/24%, FVC: 1.38/33%, FEV1/FVC: 52%).    Spirometry  consistent with mixed obstructive and restrictive disease.,  Values are stable.  {Blank single:19197::"Albuterol/Atrovent nebulizer","Xopenex/Atrovent nebulizer","Albuterol nebulizer","Albuterol four puffs via MDI","Xopenex four puffs via MDI"} treatment given in clinic with {Blank single:19197::"significant improvement in FEV1 per ATS criteria","significant improvement in FVC per ATS criteria","significant improvement in FEV1 and FVC per ATS criteria","improvement in FEV1, but not significant per ATS criteria","improvement in FVC, but not significant per ATS criteria","improvement in FEV1 and FVC, but not significant per ATS criteria","no improvement"}.  Allergy Studies: {Blank single:19197::"none","labs sent instead"," "}    {Blank single:19197::"Allergy testing results were read and interpreted by myself, documented by clinical staff."," "}      Salvatore Marvel, MD  [facility] [place] [facility]

## 2022-04-20 ENCOUNTER — Encounter: Payer: Self-pay | Admitting: Allergy & Immunology

## 2022-05-05 ENCOUNTER — Ambulatory Visit (INDEPENDENT_AMBULATORY_CARE_PROVIDER_SITE_OTHER): Payer: Medicare PPO

## 2022-05-05 DIAGNOSIS — J4530 Mild persistent asthma, uncomplicated: Secondary | ICD-10-CM | POA: Diagnosis not present

## 2022-05-05 MED ORDER — DUPILUMAB 300 MG/2ML ~~LOC~~ SOSY: 300.0000 mg | PREFILLED_SYRINGE | SUBCUTANEOUS | Status: DC

## 2022-05-19 ENCOUNTER — Ambulatory Visit: Payer: Medicare PPO

## 2022-05-24 ENCOUNTER — Ambulatory Visit (INDEPENDENT_AMBULATORY_CARE_PROVIDER_SITE_OTHER): Payer: Medicare PPO | Admitting: Allergy & Immunology

## 2022-05-24 ENCOUNTER — Ambulatory Visit: Payer: Medicare PPO

## 2022-05-24 VITALS — BP 118/68 | HR 75 | Temp 99.3°F | Ht 70.0 in | Wt 180.0 lb

## 2022-05-24 DIAGNOSIS — R21 Rash and other nonspecific skin eruption: Secondary | ICD-10-CM

## 2022-05-24 DIAGNOSIS — F410 Panic disorder [episodic paroxysmal anxiety] without agoraphobia: Secondary | ICD-10-CM | POA: Insufficient documentation

## 2022-05-24 DIAGNOSIS — J449 Chronic obstructive pulmonary disease, unspecified: Secondary | ICD-10-CM

## 2022-05-24 DIAGNOSIS — L298 Other pruritus: Secondary | ICD-10-CM | POA: Diagnosis not present

## 2022-05-24 DIAGNOSIS — E669 Obesity, unspecified: Secondary | ICD-10-CM | POA: Insufficient documentation

## 2022-05-24 DIAGNOSIS — F17200 Nicotine dependence, unspecified, uncomplicated: Secondary | ICD-10-CM | POA: Insufficient documentation

## 2022-05-24 NOTE — Patient Instructions (Addendum)
1. Aquagenic pruritus - We are stopping the Dupixent today.  - Labs were all negative.   2. Asthma with COPD overlap - We will just stop the Dupixent since you did not think that it helps.  - Daily controller medication(s): Spiriva 2.44mcg two puffs once daily and Symbicort 160/4.45mcg to 2 puffs twice daily  - Prior to physical activity: albuterol 2 puffs 10-15 minutes before physical activity. - Rescue medications: albuterol 4 puffs every 4-6 hours as needed - Changes during respiratory infections or worsening symptoms: Add on  - Asthma control goals:  * Full participation in all desired activities (may need albuterol before activity) * Albuterol use two time or less a week on average (not counting use with activity) * Cough interfering with sleep two time or less a month * Oral steroids no more than once a year * No hospitalizations  3  Return if symptoms worsen or fail to improve. I am going to route the note to Dr. Loanne Drilling to check on next steps! I just want [place] all to be on the same page.    Please inform us of any Emergency Department visits, hospitalizations, or changes in symptoms. Call us before going to the ED for breathing or allergy symptoms since we might be able to fit you in for a sick visit. Feel free to contact us anytime with any questions, problems, or concerns.  It was a pleasure to see you and your wife today!   Websites that have reliable patient information: 1. American Academy of Asthma, Allergy, and Immunology: www.aaaai.org 2. Food Allergy Research and Education (FARE): foodallergy.org 3. Mothers of Asthmatics: http://www.asthmacommunitynetwork.org 4. American College of Allergy, Asthma, and Immunology: www.acaai.org   COVID-19 Vaccine Information can be found at: ShippingScam.co.uk For questions related to vaccine distribution or appointments, please email [email or web address] or call [phone].    We realize that you might be concerned about having an allergic reaction to the COVID19 vaccines. To help with that concern, WE ARE OFFERING THE COVID19 VACCINES IN OUR OFFICE! Ask the front desk for dates!     "Like" [place] on Facebook and Instagram for our latest updates!      A healthy democracy works best when [employer] participate! Make sure you are registered to vote! If you have moved or changed any of your contact information, you will need to get this updated before voting!  In some cases, you MAY be able to register to vote online: [email or web address]

## 2022-05-24 NOTE — Progress Notes (Signed)
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 Aquagenic pruritus - We are stopping the Dupixent today.  - Labs were all negative.   2. Asthma with COPD overlap - We will just stop the Dupixent since you did not think that it helps.  - Daily controller medication(s): Spiriva 2.40mcg two puffs once daily and Symbicort 160/4.36mcg to 2 puffs twice daily  - Prior to physical activity: albuterol 2 puffs 10-15 minutes before physical activity. - Rescue medications: albuterol 4 puffs every 4-6 hours as needed - Changes during respiratory infections or worsening symptoms: Add on  - Asthma control goals:  * Full participation in all desired activities (may need albuterol before activity) * Albuterol use two time or less a week on average (not counting use with activity) * Cough interfering with sleep two time or less a month * Oral steroids no more than once a year * No hospitalizations  3  Return if symptoms worsen or fail to improve. I am going to route the note to Dr. Loanne Drilling to check on next steps! I just want [place] all to be on the same page.   Subjective:   Martin Mendez is a 76 y.o. 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  Martin Mendez is a 76 y.o. male presenting for a follow up visit.  He was last seen around a month ago.  At that time, we did start him on Dupixent. His lung testing remained stable in the 24% range. We continued with Spiriva 2.5 mcg two puffs once daily.   Since the last visit, he has remained fairly stable.   He is set to get his third dose of Dupixent today.  He and his wife do not think it is helped at all with the breathing or the itching.  He honestly is not excited about giving it a try today at all.  This would have been his third dose today.  Since starting the [place], he has developed a flat erythematous rash on his head.  Pictures shown below in the physical exam.  It does not itch.  He has tried some Benadryl ointment and over-the-counter hydrocortisone ointment without improvement.  Asthma/Respiratory Symptom History: Coughing has been the most irritating symptom for him.  He did get some Mucinex and this has loosened up his mucous. It is still relatively thick.  He remains on the Symbicort as well as the Spiriva.  At one point, he was using the Symbicort only as needed but he has been more compliant with it.  He  has an appointment with Dr. Loanne Drilling next month.  Skin Symptom History: He continues to have the itching.  The Dupixent did not seem to help at all.  Otherwise, there have been no changes to his past medical history, surgical history, family history, or social history.    Review of Systems  Constitutional: Negative.  Negative for chills, fever, malaise/fatigue and weight loss.  HENT: Negative.  Negative for congestion, ear discharge and ear pain.   Eyes:  Negative for pain, discharge and redness.  Respiratory:  Positive for cough and sputum production. Negative for shortness  of breath and wheezing.   Cardiovascular: Negative.  Negative for chest pain and palpitations.  Gastrointestinal:  Negative for abdominal pain, heartburn, nausea and vomiting.  Skin: Negative.  Negative for itching and rash.  Neurological:  Negative for dizziness and headaches.  Endo/Heme/Allergies:  Negative for environmental allergies. Does not bruise/bleed easily.       Objective:   Blood pressure 118/68, pulse 75, temperature 99.3 F (37.4 C), temperature source Temporal, height 5\' 10"  (1.778 m), weight 180 lb (81.6 kg), SpO2 92 %. Body mass index is 25.83 kg/m.    Physical Exam Vitals reviewed.  Constitutional:      Appearance: He is well-developed.     Comments: More reserved today.  HENT:     Head: Normocephalic and atraumatic.     Right Ear: Tympanic membrane, ear canal and external ear normal. No drainage, swelling or tenderness. Tympanic membrane is not injected, scarred, erythematous, retracted or bulging.     Left Ear: Tympanic membrane, ear canal and external ear normal. No drainage, swelling or tenderness. Tympanic membrane is not injected, scarred, erythematous, retracted or bulging.     Nose: No nasal deformity, septal deviation, mucosal edema or rhinorrhea.     Right Turbinates: Enlarged, swollen and pale.     Left Turbinates: Enlarged, swollen and pale.     Right Sinus: No maxillary sinus tenderness or frontal sinus tenderness.     Left Sinus: No maxillary sinus tenderness or frontal sinus tenderness.     Mouth/Throat:     Mouth: Mucous membranes are moist. Mucous membranes are pale and not dry.     Pharynx: Uvula midline.     Comments: Minimal cobblestoning evident.  Eyes:     General: Lids are normal. Vision grossly intact. Allergic shiner present.        Right eye: No discharge.        Left eye: No discharge.     Conjunctiva/sclera: Conjunctivae normal.     Right eye: Right conjunctiva is not injected. No chemosis.    Left eye: Left conjunctiva is not  injected. No chemosis.    Pupils: Pupils are equal, round, and reactive to light.  Cardiovascular:     Rate and Rhythm: Normal rate and regular rhythm.     Heart sounds: Normal heart sounds.  Pulmonary:     Effort: Pulmonary effort is normal. No tachypnea, accessory muscle usage or respiratory distress.     Breath sounds: Normal breath sounds. Decreased air movement present. No wheezing, rhonchi or rales.     Comments: Decreased air movement at the bases. Chest:     Chest wall: No tenderness.  Abdominal:     Tenderness: There is no abdominal tenderness. There is no guarding or rebound.  Lymphadenopathy:     Head:     Right side of head: No submandibular, tonsillar or occipital adenopathy.     Left side of head: No submandibular, tonsillar  or occipital adenopathy.     Cervical: No cervical adenopathy.  Skin:    Coloration: Skin is not pale.     Findings: No abrasion, erythema, petechiae or rash. Rash is not papular, urticarial or vesicular.     Comments: No urticaria or excoriations.  Neurological:     Mental Status: He is alert.  Psychiatric:        Behavior: Behavior is cooperative.          Diagnostic studies: none     Salvatore Marvel, MD  [facility]

## 2022-05-25 ENCOUNTER — Telehealth: Payer: Self-pay

## 2022-05-25 ENCOUNTER — Encounter: Payer: Self-pay | Admitting: Allergy & Immunology

## 2022-05-25 MED ORDER — SPIRIVA RESPIMAT 2.5 MCG/ACT IN AERS: INHALATION_SPRAY | RESPIRATORY_TRACT | 5 refills | Status: DC

## 2022-05-25 MED ORDER — BUDESONIDE-FORMOTEROL FUMARATE 160-4.5 MCG/ACT IN AERO
2.0000 | INHALATION_SPRAY | Freq: Two times a day (BID) | RESPIRATORY_TRACT | 5 refills | Status: DC
Start: 2022-05-25 — End: 2022-11-01

## 2022-05-25 NOTE — Telephone Encounter (Signed)
-----   Message from [place], MD sent at 05/25/2022  8:29 AM EDT ----- Regarding: Asthma Please schedule for routine follow-up in August. Patient sent back from Allergy clinic after trying Dupixent

## 2022-05-25 NOTE — Telephone Encounter (Signed)
Called and got patient set up for follow up   June 21, 2022 2:15pm Dr Loanne Drilling  Patient verbalized understanding. Nothing further needed

## 2022-06-15 ENCOUNTER — Telehealth: Payer: Self-pay | Admitting: Pulmonary Disease

## 2022-06-16 ENCOUNTER — Telehealth: Payer: Self-pay | Admitting: Pulmonary Disease

## 2022-06-16 NOTE — Telephone Encounter (Signed)
Called patient yesterday 06/15/2022 and forgot to document that I called him. We went over office visit that is coming up for patient. I advised him that Dr Loanne Drilling reached out to me to get him scheduled for a follow up and that there are no issues of concern. Nothing further needed

## 2022-06-16 NOTE — Telephone Encounter (Signed)
Patient called office back and states that Dr Loanne Drilling told him that he needed a CT scan. I told patient that I would talk to Dr Loanne Drilling about CT scan. I do not see it saying anything about CT in last AVS.  Dr Loanne Drilling do you want patient to have new CT scan before follow up with you  Please advise

## 2022-06-17 ENCOUNTER — Telehealth: Payer: Self-pay | Admitting: Pulmonary Disease

## 2022-06-17 ENCOUNTER — Other Ambulatory Visit: Payer: Self-pay | Admitting: *Deleted

## 2022-06-17 DIAGNOSIS — Z87891 Personal history of nicotine dependence: Secondary | ICD-10-CM

## 2022-06-17 DIAGNOSIS — Z122 Encounter for screening for malignant neoplasm of respiratory organs: Secondary | ICD-10-CM

## 2022-06-17 NOTE — Telephone Encounter (Signed)
He was referred to the lung screen program. It appears it has been approved in Feb 2023.  Martin Mendez - can you look into why patient has not been scheduled for CT lung screen?

## 2022-06-17 NOTE — Telephone Encounter (Signed)
Hey when you are able to get this patient scheduled for a CT for the lung cancer screening can you let me know so I can get him scheduled for a follow up with Dr Loanne Drilling after the scan is done please.   It looks like he was approved for the Lung Cancer Screening in Feb per Dr Loanne Drilling but patient states he never heard anything from anyone.  Thank you  Minal Stuller

## 2022-06-17 NOTE — Telephone Encounter (Signed)
Dr Loanne Drilling please advise on [place] phone call below:  Calling because the insurance wants to do a peer to peer conversation with Dr. Loanne Drilling. the offer expires 06/21/22 at 12pm standard time. They need to know whether or not she would like to do this. She can give them a call and ket them know. Reference number for this is: [id number]  Please advise

## 2022-06-17 NOTE — Telephone Encounter (Signed)
What is the peer to peer regarding? The CT Lung Screen?

## 2022-06-17 NOTE — Telephone Encounter (Signed)
Calling because the insurance wants to do a peer to peer conversation with Dr. Loanne Drilling. the offer expires 06/21/22 at 12pm standard time. They need to know whether or not she would like to do this. She can give them a call and ket them know. Reference number for this is: [id number]

## 2022-06-17 NOTE — Telephone Encounter (Signed)
Spoke with pt and scheduled shared decision making visit 07/05/22 and lung screening CT 07/06/22.

## 2022-06-20 NOTE — Telephone Encounter (Signed)
I will have the [facility] see what insurance is saying and update you

## 2022-06-20 NOTE — Telephone Encounter (Signed)
I called & spoke to Martin Mendez w/ [facility] to see what the peer to peer is regarding.    Per Martin Mendez, this is for continuation of a home ventilator & will require a peer to peer.  This will need to be completed by 12 PM 06/21/22 ref# [id number].  Martin Mendez can be reached.  [phone] is [name]'s direct number.  If Martin Mendez does not answer the call, then please leave a direct number to be reached at  by their medical director.

## 2022-06-20 NOTE — Telephone Encounter (Signed)
Contacted [name] at 12:30 PM to schedule peer to peer. Contact information was provided. Awaiting medical doctor to reach out to me.

## 2022-06-20 NOTE — Telephone Encounter (Signed)
Langley Gauss,  Could you please look into why we are getting a peer to peer query? This is unusual for a lung screening for a qualified patient.

## 2022-06-20 NOTE — Telephone Encounter (Signed)
Hey ladies,   Do you happen to know what the peer to peer is regarding for Dr Loanne Drilling and this patient. I have already taken care of the Lung Cancer screening. But she is wondering about the peer to peer that expires 06/21/2022.  Please advise

## 2022-06-21 ENCOUNTER — Other Ambulatory Visit: Payer: Self-pay | Admitting: *Deleted

## 2022-06-21 ENCOUNTER — Ambulatory Visit: Payer: Medicare PPO | Admitting: Pulmonary Disease

## 2022-06-21 ENCOUNTER — Telehealth: Payer: Self-pay | Admitting: Pulmonary Disease

## 2022-06-21 DIAGNOSIS — J449 Chronic obstructive pulmonary disease, unspecified: Secondary | ICD-10-CM

## 2022-06-21 DIAGNOSIS — J9612 Chronic respiratory failure with hypercapnia: Secondary | ICD-10-CM

## 2022-06-21 NOTE — Telephone Encounter (Signed)
Left message for patient to call back  

## 2022-06-21 NOTE — Telephone Encounter (Signed)
Called and spoke with pt letting him know the info per JE about peer-to-peer and he verbalized understanding. Appt has been scheduled for pt with JE for f/u. Nothing further needed.

## 2022-06-21 NOTE — Telephone Encounter (Signed)
Peer to peer completed Insurance upheld DENIAL for continued vent use and recommended transition to BiPAP for his condition. Vent approved for additional 3 months while we coordinate NIV device change.  Patient continues to exhibit signs of hypercapnia associated with chronic respiratory failures secondary to severe COPD. Patient requires the use of NIV both QHS and daytime to help with exacerbation periods. The use of the NIV will treat patient's high PCO2 levels and can reduce risk of exacerbations and future hospitalizations when used at nigh and during the day.   Will order BiPAP 10/5 for chronic respiratory failure with hypercapnia and very severe COPD.  Staff - please communicate above with patient and schedule for follow-up with me in October

## 2022-06-28 ENCOUNTER — Telehealth: Payer: Self-pay | Admitting: Pulmonary Disease

## 2022-06-28 NOTE — Telephone Encounter (Signed)
He received a letter from the insurance that he would need a approval for the Bipap machine as well. He wants to make sure that he is not getting his equipment.    I called and spoke with Leroy Sea and he is going to check and see what is needed. I let the patient know that we are waiting on the [employer] team to call back and see what else is needed.

## 2022-06-29 NOTE — Telephone Encounter (Signed)
Pt has an upcoming appt 8/22.

## 2022-07-05 ENCOUNTER — Encounter: Payer: Self-pay | Admitting: Acute Care

## 2022-07-05 ENCOUNTER — Ambulatory Visit (INDEPENDENT_AMBULATORY_CARE_PROVIDER_SITE_OTHER): Payer: Medicare PPO | Admitting: Acute Care

## 2022-07-05 DIAGNOSIS — Z87891 Personal history of nicotine dependence: Secondary | ICD-10-CM | POA: Diagnosis not present

## 2022-07-05 NOTE — Progress Notes (Signed)
Virtual Visit via Telephone Note  I connected with [name] on 07/05/22 at  3:00 PM EDT by telephone and verified that I am speaking with the correct person using two identifiers.  Location: Patient:  At home  Provider:  [place], [place], [place], [address]    I discussed the limitations, risks, security and privacy concerns of performing an evaluation and management service by telephone and the availability of in person appointments. I also discussed with the patient that there may be a patient responsible charge related to this service. The patient expressed understanding and agreed to proceed.  Shared Decision Making Visit Lung Cancer Screening Program [phone])   Eligibility: Age 76 y.o. Pack Years Smoking History Calculation  75 pack year smoking history (# packs/per year x # years smoked) Recent History of coughing up blood  no Unexplained weight loss? no ( >Than 15 pounds within the last 6 months ) Prior History Lung / other cancer no (Diagnosis within the last 5 years already requiring surveillance chest CT Scans). Smoking Status Former Smoker Former Smokers: Years since quit: 1 year  Quit Date: 2022  Visit Components: Discussion included one or more decision making aids. yes Discussion included risk/benefits of screening. yes Discussion included potential follow up diagnostic testing for abnormal scans. yes Discussion included meaning and risk of over diagnosis. yes Discussion included meaning and risk of False Positives. yes Discussion included meaning of total radiation exposure. yes  Counseling Included: Importance of adherence to annual lung cancer LDCT screening. yes Impact of comorbidities on ability to participate in the program. yes Ability and willingness to under diagnostic treatment. yes  Smoking Cessation Counseling: Current Smokers:  Discussed importance of smoking cessation. yes Information about tobacco cessation classes and  interventions provided to patient. yes Patient provided with "ticket" for LDCT Scan. yes Symptomatic Patient. no  Counseling NA Diagnosis Code: Tobacco Use Z72.0 Asymptomatic Patient yes  Counseling (Intermediate counseling: > three minutes counseling) U2725 Former Smokers:  Discussed the importance of maintaining cigarette abstinence. yes Diagnosis Code: Personal History of Nicotine Dependence. D66.440 Information about tobacco cessation classes and interventions provided to patient. Yes Patient provided with "ticket" for LDCT Scan. yes Written Order for Lung Cancer Screening with LDCT placed in Epic. Yes (CT Chest Lung Cancer Screening Low Dose W/O CM) [id number] Z12.2-Screening of respiratory organs Z87.891-Personal history of nicotine dependence  I spent 25 minutes of face to face time/virtual visit time  with  Mr/ Diosdado discussing the risks and benefits of lung cancer screening. We took the time to pause the power point at intervals to allow for questions to be asked and answered to ensure understanding. We discussed that he had taken the single most powerful action possible to decrease his risk of developing lung cancer when he quit smoking. I counseled him to remain smoke free, and to contact me if he ever had the desire to smoke again so that I can provide resources and tools to help support the effort to remain smoke free. We discussed the time and location of the scan, and that either  Doroteo Glassman RN, Joella Prince, RN or I  or I will call / send a letter with the results within  24-72 hours of receiving them. He has the office contact information in the event he needs to speak with me,  he verbalized understanding of all of the above and had no further questions upon leaving the office.     I explained to the patient that  there has been a high incidence of coronary artery disease noted on these exams. I explained that this is a non-gated exam therefore degree or severity cannot be  determined. This patient is not on statin therapy. I have asked the patient to follow-up with their PCP regarding any incidental finding of coronary artery disease and management with diet or medication as they feel is clinically indicated. The patient verbalized understanding of the above and had no further questions.     Magdalen Spatz, NP 07/05/2022

## 2022-07-05 NOTE — Patient Instructions (Signed)
Thank you for participating in the [facility] Lung Cancer Screening Program. It was our pleasure to meet you today. We will call you with the results of your scan within the next few days. Your scan will be assigned a Lung RADS category score by the physicians reading the scans.  This Lung RADS score determines follow up scanning.  See below for description of categories, and follow up screening recommendations. We will be in touch to schedule your follow up screening annually or based on recommendations of our providers. We will fax a copy of your scan results to your Primary Care Physician, or the physician who referred you to the program, to ensure they have the results. Please call the office if you have any questions or concerns regarding your scanning experience or results.  Our office number is [phone]. Please speak with Denise Phelps, RN. , or  Denise Buckner RN, They are  our Lung Cancer Screening RN.'s If They are unavailable when you call, Please leave a message on the voice mail. We will return your call at our earliest convenience.This voice mail is monitored several times a day.  Remember, if your scan is normal, we will scan you annually as long as you continue to meet the criteria for the program. (Age 55-77, Current smoker or smoker who has quit within the last 15 years). If you are a smoker, remember, quitting is the single most powerful action that you can take to decrease your risk of lung cancer and other pulmonary, breathing related problems. We know quitting is hard, and we are here to help.  Please let us know if there is anything we can do to help you meet your goal of quitting. If you are a former smoker, congratulations. We are proud of you! Remain smoke free! Remember you can refer friends or family members through the number above.  We will screen them to make sure they meet criteria for the program. Thank you for helping us take better care of you by  participating in Lung Screening.  You can receive free nicotine replacement therapy ( patches, gum or mints) by calling 1-800-QUIT NOW. Please call so we can get you on the path to becoming  a non-smoker. I know it is hard, but you can do this!  Lung RADS Categories:  Lung RADS 1: no nodules or definitely non-concerning nodules.  Recommendation is for a repeat annual scan in 12 months.  Lung RADS 2:  nodules that are non-concerning in appearance and behavior with a very low likelihood of becoming an active cancer. Recommendation is for a repeat annual scan in 12 months.  Lung RADS 3: nodules that are probably non-concerning , includes nodules with a low likelihood of becoming an active cancer.  Recommendation is for a 6-month repeat screening scan. Often noted after an upper respiratory illness. We will be in touch to make sure you have no questions, and to schedule your 6-month scan.  Lung RADS 4 A: nodules with concerning findings, recommendation is most often for a follow up scan in 3 months or additional testing based on our provider's assessment of the scan. We will be in touch to make sure you have no questions and to schedule the recommended 3 month follow up scan.  Lung RADS 4 B:  indicates findings that are concerning. We will be in touch with you to schedule additional diagnostic testing based on our provider's  assessment of the scan.  Other options for assistance in smoking cessation (   As covered by your insurance benefits)  Hypnosis for smoking cessation  [employer] [phone]  Acupuncture for smoking cessation  [facility] [phone]   

## 2022-07-06 ENCOUNTER — Ambulatory Visit
Admission: RE | Admit: 2022-07-06 | Discharge: 2022-07-06 | Disposition: A | Discharge status: Home / Self Care | Payer: Medicare PPO | Source: Ambulatory Visit | Attending: Acute Care | Admitting: Acute Care

## 2022-07-06 DIAGNOSIS — Z87891 Personal history of nicotine dependence: Secondary | ICD-10-CM

## 2022-07-06 DIAGNOSIS — Z122 Encounter for screening for malignant neoplasm of respiratory organs: Secondary | ICD-10-CM

## 2022-07-08 ENCOUNTER — Telehealth: Payer: Self-pay | Admitting: Acute Care

## 2022-07-08 DIAGNOSIS — R911 Solitary pulmonary nodule: Secondary | ICD-10-CM

## 2022-07-08 NOTE — Telephone Encounter (Signed)
I have called the patient with the results of his low dose Ct Chest. I explained that there is an aggressive appearing nodule there is a somewhat ill-defined macrolobulated pulmonary nodule with irregular margins, with a volume derived mean diameter of 11.4 mm. The scan was read as a Lung RADS 4 A : suspicious findings, either short term follow up in 3 months or alternatively  PET Scan evaluation may be considered when there is a solid component of  8 mm or larger.  I have spoken with Dr. Loanne Drilling who sees the patient , and she is in agreement with PET now. Pt. Will need follow up with Dr. Verlee Monte as soon as possible after PET to ensure immediate review  of PET and plan for next steps.   I will place the order for the PET scan. Langley Gauss and B[place], please see when PET  is scheduled and place on Dr. Glenetta Hew schedule as soon as possible after scan.  Thanks so much

## 2022-07-14 ENCOUNTER — Other Ambulatory Visit: Payer: Self-pay | Admitting: Pulmonary Disease

## 2022-07-15 NOTE — Telephone Encounter (Signed)
Patient has PET scheduled for 07/25/22. He is on my schedule for 08/04/22. I will discuss PET results with him and if procedure, will coordinate with Dr. Verlee Monte.  Closing encounter.  Rodman Pickle, M.D. [facility] Pulmonary/Critical Care Medicine 07/15/2022 6:56 PM

## 2022-07-19 DIAGNOSIS — H35722 Serous detachment of retinal pigment epithelium, left eye: Secondary | ICD-10-CM | POA: Diagnosis not present

## 2022-07-19 DIAGNOSIS — H353221 Exudative age-related macular degeneration, left eye, with active choroidal neovascularization: Secondary | ICD-10-CM | POA: Diagnosis not present

## 2022-07-21 ENCOUNTER — Ambulatory Visit: Payer: Medicare PPO | Admitting: Allergy & Immunology

## 2022-07-22 DIAGNOSIS — H353221 Exudative age-related macular degeneration, left eye, with active choroidal neovascularization: Secondary | ICD-10-CM | POA: Diagnosis not present

## 2022-07-22 DIAGNOSIS — H35433 Paving stone degeneration of retina, bilateral: Secondary | ICD-10-CM | POA: Diagnosis not present

## 2022-07-22 DIAGNOSIS — H353111 Nonexudative age-related macular degeneration, right eye, early dry stage: Secondary | ICD-10-CM | POA: Diagnosis not present

## 2022-07-22 DIAGNOSIS — H43813 Vitreous degeneration, bilateral: Secondary | ICD-10-CM | POA: Diagnosis not present

## 2022-07-25 ENCOUNTER — Ambulatory Visit (HOSPITAL_COMMUNITY)
Admission: RE | Admit: 2022-07-25 | Discharge: 2022-07-25 | Disposition: A | Discharge status: Home / Self Care | Payer: Medicare PPO | Source: Ambulatory Visit | Attending: Acute Care | Admitting: Acute Care

## 2022-07-25 ENCOUNTER — Other Ambulatory Visit: Payer: Self-pay | Admitting: Adult Health

## 2022-07-25 DIAGNOSIS — I6522 Occlusion and stenosis of left carotid artery: Secondary | ICD-10-CM

## 2022-07-25 DIAGNOSIS — R911 Solitary pulmonary nodule: Secondary | ICD-10-CM | POA: Insufficient documentation

## 2022-07-25 LAB — GLUCOSE, CAPILLARY: Glucose-Capillary: 96 mg/dL (ref 70–99)

## 2022-07-25 MED ORDER — FLUDEOXYGLUCOSE F - 18 (FDG) INJECTION
9.0000 | Freq: Once | INTRAVENOUS | Status: AC | PRN
Start: 2022-07-25 — End: 2022-07-25
  Administered 2022-07-25: 8.97 via INTRAVENOUS

## 2022-08-04 ENCOUNTER — Encounter: Payer: Self-pay | Admitting: Pulmonary Disease

## 2022-08-04 ENCOUNTER — Ambulatory Visit: Payer: Medicare PPO | Admitting: Pulmonary Disease

## 2022-08-04 VITALS — BP 112/70 | HR 87 | Ht 70.0 in | Wt 178.2 lb

## 2022-08-04 DIAGNOSIS — J449 Chronic obstructive pulmonary disease, unspecified: Secondary | ICD-10-CM | POA: Diagnosis not present

## 2022-08-04 DIAGNOSIS — R911 Solitary pulmonary nodule: Secondary | ICD-10-CM

## 2022-08-04 DIAGNOSIS — J9612 Chronic respiratory failure with hypercapnia: Secondary | ICD-10-CM

## 2022-08-04 DIAGNOSIS — Z23 Encounter for immunization: Secondary | ICD-10-CM | POA: Diagnosis not present

## 2022-08-04 MED ORDER — ALBUTEROL SULFATE 0.63 MG/3ML IN NEBU: 1.0000 | INHALATION_SOLUTION | Freq: Two times a day (BID) | RESPIRATORY_TRACT | 11 refills | Status: DC

## 2022-08-04 NOTE — Patient Instructions (Signed)
COPD, unspecified --CONTINUE Spiriva TWO puffs ONCE daily --CONTINUE Symbicort TWO puffs TWICE a day. Continue until you run out --CONTINUE Albuterol AS NEEDED for shortness of breath or wheezing --ORDER nebulizer and Albuterol nebulizer up to twice a day as needed for mucous clearance  Chronic hypercarbic respiratory failure secondary to COPD --Our office contact [employer] regarding transition from vent to BiPAP --CONTINUE ventilator until your receive BIPAP  Right lower lobe nodule --ORDER CT Chest without contrast in 3 months (December 2023)  Follow-up with me in 3 months after CT scan

## 2022-08-04 NOTE — Progress Notes (Signed)
Subjective:   PATIENT ID: Martin Mendez GENDER: male DOB: 1946-07-03, MRN: [id number]   HPI  Chief Complaint  Patient presents with   Follow-up    No updates    Reason for Visit: Follow-up  [name] is a 76 year old male former smoker with emphysema and PTSD who presents for follow-up  Synopsis: He was admitted from 06/05/2021 to 06/09/2021 for hematemesis.  EGD on 06/05/2021 demonstrated gastritis, duodenitis and nonbleeding duodenal ulcer.  His hospital course was complicated by acute respiratory failure with hypercapnia.  He was treated with noninvasive ventilation with improvement.  Pulmonary was consulted while inpatient and recommended noninvasive ventilation for outpatient management.  08/04/22 He is compliant with his vent. Based on peer to peer he should have NIV until November. He has had good results with his vent. Complaint with his bronchodilators. Denies significant shortness of breath, cough or wheezing. Will occasionally have cough. No exacerbations since 05/2021  Prior inhalers: Incruse - prefers Spiriva  Social History: Former smoker  Past Medical History:  Diagnosis Date   COPD (chronic obstructive pulmonary disease) ([place])    PTSD (post-traumatic stress disorder)     Outpatient Medications Prior to Visit  Medication Sig Dispense Refill   acetaminophen (TYLENOL) 500 MG tablet Take 500 mg by mouth as needed.     albuterol (VENTOLIN HFA) 108 (90 Base) MCG/ACT inhaler TAKE 2 PUFFS BY MOUTH EVERY 6 HOURS AS NEEDED FOR WHEEZE OR SHORTNESS OF BREATH 18 each 2   budesonide-formoterol (SYMBICORT) 160-4.5 MCG/ACT inhaler Inhale 2 puffs into the lungs in the morning and at bedtime. 1 each 5   citalopram (CELEXA) 20 MG tablet TAKE 1 TABLET BY MOUTH ONCE DAILY IN THE MORNING 90 tablet 1   Multiple Vitamin (MULTI VITAMIN DAILY PO) Take by mouth.     propranolol (INDERAL) 10 MG tablet TAKE 1 TABLET BY MOUTH ONCE DAILY AS NEEDED (Patient taking differently: Take 10  mg by mouth daily as needed (anxiety).) 30 tablet 0   Tiotropium Bromide Monohydrate (SPIRIVA RESPIMAT) 2.5 MCG/ACT AERS INHALE 2 PUFFS BY MOUTH INTO THE LUNGS DAILY 4 g 5   DUPIXENT 300 MG/2ML prefilled syringe Inject into the skin. (Patient not taking: Reported on 08/04/2022)     Facility-Administered Medications Prior to Visit  Medication Dose Route Frequency Provider Last Rate Last Admin   dupilumab (DUPIXENT) prefilled syringe 300 mg  300 mg Subcutaneous Q14 Days Valentina Shaggy, MD   300 mg at 05/05/22 1425    Review of Systems  Constitutional:  Negative for chills, diaphoresis, fever, malaise/fatigue and weight loss.  HENT:  Negative for congestion.   Respiratory:  Positive for cough and sputum production. Negative for hemoptysis, shortness of breath and wheezing.   Cardiovascular:  Negative for chest pain, palpitations and leg swelling.     Objective:   Vitals:   08/04/22 1446  Pulse: 87  SpO2: 93%  Weight: 178 lb 3.2 oz (80.8 kg)  Height: 5\' 10"  (1.778 m)   SpO2: 93 %  Physical Exam: General: Well-appearing, no acute distress HENT: [place], AT Eyes: EOMI, no scleral icterus Respiratory: Clear to auscultation bilaterally.  No crackles, wheezing or rales Cardiovascular: RRR, -M/R/G, no JVD Extremities:-Edema,-tenderness Neuro: AAO x4, CNII-XII grossly intact Psych: Normal mood, normal affect  Data Reviewed:  Imaging: CTA 10/10/2019-moderate centrilobular emphysema.  Right upper lobe 3 mm nodular density, right lower lobe 3 mm nodule.  Left upper lobe subpleural 2 and 3 mm nodule.  Left lower lobe 3 mm nodule.  No aneurysm or dissection.  No acute findings in chest abdomen or pelvis CT Chest Lung Cancer 07/06/22 - Aggressive appearing pulmonary nodule measuring 1.14 cm in superior segment of right lower lobe concerning for neoplasm PET/CT 07/25/22 - 92mm RLL nodule with minimal abnormal FDG avidity  PFT: None on file  Labs: CBC    Component Value Date/Time    WBC 6.7 12/30/2021 1454   WBC 7.6 06/10/2021 0207   RBC 5.20 12/30/2021 1454   RBC 3.27 (L) 06/10/2021 0207   HGB 13.5 12/30/2021 1454   HCT 42.9 12/30/2021 1454   PLT 198 06/10/2021 0207   MCV 83 12/30/2021 1454   MCH 26.0 (L) 12/30/2021 1454   MCH 30.6 06/10/2021 0207   MCHC 31.5 12/30/2021 1454   MCHC 33.3 06/10/2021 0207   RDW 15.4 12/30/2021 1454   LYMPHSABS 1.5 12/30/2021 1454   MONOABS 0.6 06/05/2021 0321   EOSABS 0.4 12/30/2021 1454   BASOSABS 0.1 12/30/2021 1454   Absolute eosinophils 06/05/2021-300  ABG    Component Value Date/Time   PHART 7.325 (L) 06/08/2021 2200   PCO2ART 81.4 (HH) 06/08/2021 2200   PO2ART 105 06/08/2021 2200   HCO3 37.8 (H) 06/10/2021 1654   O2SAT 88.0 06/10/2021 1654   Interpretation: Acute on chronic hypercarbic respiratory failure  Assessment & Plan:   Discussion: 76 year old male with moderate emphysema and 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 No exacerbations in >1 year (05/2021). COPD symptoms overall well controlled on LAMA with occasional productive cough. Has declined flutter valve. Discussed nebulizer use for mucous clearance. Discussed clinical course and management of COPD including bronchodilator regimen and action plan for exacerbation. Reviewed CT imaging regarding borderline nodule with minimal FDG avidity. Discussed surveillance vs diagnostic testing. After shared decision will plan for repeat CT.  COPD, unspecified --CONTINUE Spiriva TWO puffs ONCE daily --CONTINUE Symbicort TWO puffs TWICE a day. Continue until you run out --CONTINUE Albuterol AS NEEDED for shortness of breath or wheezing --ORDER nebulizer and Albuterol nebulizer up to twice a day as needed for mucous clearance  Chronic hypercarbic respiratory failure secondary to COPD --Our office contact [facility] regarding transition from vent to BiPAP --CONTINUE ventilator until your receive BIPAP  Right lower lobe nodule --ORDER CT  Chest without contrast in 3 months (December 2023)  Tobacco cessation --Quit smoking July 2022  Health Maintenance Immunization History  Administered Date(s) Administered   Fluad Quad(high Dose 65+) 10/11/2021   Influenza-Unspecified 09/14/2008   PFIZER Comirnaty(Gray Top)Covid-19 Tri-Sucrose Vaccine 06/06/2021   CT Lung Screen- Refer to program  Orders Placed This Encounter  Procedures   CT Chest Wo Contrast    Standing Status:   Future    Standing Expiration Date:   08/05/2023    Scheduling Instructions:     To be completed in 40mo    Order Specific Question:   Preferred imaging location?    Answer:   [facility]   Flu Vaccine QUAD High Dose(Fluad)   Ambulatory Referral for DME    Referral Priority:   Routine    Referral Type:   Durable Medical Equipment Purchase    Number of Visits Requested:   1   Meds ordered this encounter  Medications   albuterol (ACCUNEB) 0.63 MG/3ML nebulizer solution    Sig: Take 3 mLs (0.63 mg total) by nebulization 2 (two) times daily.    Dispense:  180 mL    Refill:  11   Return in about 3 months (around 11/03/2022). After  CT  I have spent a total time of36-minutes on the day of the appointment including chart review, data review, collecting history, coordinating care and discussing medical diagnosis and plan with the patient/family. Past medical history, allergies, medications were reviewed. Pertinent imaging, labs and tests included in this note have been reviewed and interpreted independently by me.   [place], MD [facility] 08/04/2022 2:48 PM  Office Number [phone]

## 2022-08-10 ENCOUNTER — Telehealth: Payer: Self-pay | Admitting: Pulmonary Disease

## 2022-08-10 NOTE — Telephone Encounter (Signed)
Please contact patient's DME regarding BiPAP status.  He is currently on a ventilator (Trilogy). Asking when he will receive his BiPAP and how transition to new machine will happen.

## 2022-08-11 ENCOUNTER — Ambulatory Visit (HOSPITAL_COMMUNITY)
Admission: RE | Admit: 2022-08-11 | Discharge: 2022-08-11 | Disposition: A | Discharge status: Home / Self Care | Payer: Medicare PPO | Source: Ambulatory Visit | Attending: Adult Health | Admitting: Adult Health

## 2022-08-11 ENCOUNTER — Telehealth: Payer: Self-pay | Admitting: *Deleted

## 2022-08-11 DIAGNOSIS — I6522 Occlusion and stenosis of left carotid artery: Secondary | ICD-10-CM | POA: Insufficient documentation

## 2022-08-11 NOTE — Telephone Encounter (Signed)
LVM, advised patient that his recent carotid ultrasound showed minimal right ICA narrowing and stable left ICA 1 to 39% stenosis.  No changes since prior imaging 1 year ago. Left # for questions.

## 2022-08-11 NOTE — Telephone Encounter (Signed)
Spk to patient who has received his Bipap and will come to pap therapy appt after 12/11 when he's had CT to discuss both. Provider schedule not out recall placed

## 2022-08-15 NOTE — Telephone Encounter (Signed)
Received fax from Desma Maxim with [facility] requesting ov notes faxed to [phone]. Requested completed nothing further

## 2022-08-16 ENCOUNTER — Encounter: Payer: Self-pay | Admitting: Physician Assistant

## 2022-08-16 ENCOUNTER — Ambulatory Visit: Payer: Medicare PPO | Admitting: Physician Assistant

## 2022-08-16 DIAGNOSIS — F3342 Major depressive disorder, recurrent, in full remission: Secondary | ICD-10-CM

## 2022-08-16 DIAGNOSIS — F411 Generalized anxiety disorder: Secondary | ICD-10-CM | POA: Diagnosis not present

## 2022-08-16 NOTE — Progress Notes (Signed)
[facility]  Patient ID: Martin Mendez,  MRN: [id number]  PCP: Wenda Low, MD  Date of Evaluation: 08/16/2022 Time spent:25 minutes  Chief Complaint:  Chief Complaint   Depression; Anxiety; Follow-up    Virtual Visit via Telehealth  I connected with patient by telephone, with their informed consent, and verified patient privacy and that I am speaking with the correct person using two identifiers.  I am private, in my office and the patient is at home.  I discussed the limitations, risks, security and privacy concerns of performing an evaluation and management service by telephone and the availability of in person appointments. I also discussed with the patient that there may be a patient responsible charge related to this service. The patient expressed understanding and agreed to proceed.   I discussed the assessment and treatment plan with the patient. The patient was provided an opportunity to ask questions and all were answered. The patient agreed with the plan and demonstrated an understanding of the instructions.   The patient was advised to call back or seek an in-person evaluation if the symptoms worsen or if the condition fails to improve as anticipated.  I provided 25 minutes of non-face-to-face time during this encounter.  HISTORY/CURRENT STATUS: For routine med check.  Doing well with mental health.  The Celexa is still working well.  He is able to enjoy things.  He and his wife take a 1/2 mile walk every morning which he enjoys.  Energy and motivation are good for him at his age.  Memory is stable.  Appetite is normal and weight is stable.  ADLs and personal hygiene are normal.  States it takes him longer to do things that he has to get done but he can still do what he needs to.  Rarely has anxiety.  No panic attacks.  Sleeps well most of the time.  No suicidal or homicidal thoughts.  Patient denies increased energy with decreased need for sleep, increased  talkativeness, racing thoughts, impulsivity or risky behaviors, increased spending, increased libido, grandiosity, increased irritability or anger, paranoia, or hallucinations.  Denies dizziness, syncope, seizures, numbness, tingling, tremor, tics, unsteady gait, slurred speech, confusion. Denies muscle or joint pain, stiffness, or dystonia.  Individual Medical History/ Review of Systems: Changes? :No      Past medications for mental health diagnoses include: unknown  Allergies: Penicillins, Dupixent [dupilumab], and Sulfa antibiotics  Current Medications:  Current Outpatient Medications:    acetaminophen (TYLENOL) 500 MG tablet, Take 500 mg by mouth as needed., Disp: , Rfl:    albuterol (ACCUNEB) 0.63 MG/3ML nebulizer solution, Take 3 mLs (0.63 mg total) by nebulization 2 (two) times daily., Disp: 180 mL, Rfl: 11   albuterol (VENTOLIN HFA) 108 (90 Base) MCG/ACT inhaler, TAKE 2 PUFFS BY MOUTH EVERY 6 HOURS AS NEEDED FOR WHEEZE OR SHORTNESS OF BREATH, Disp: 18 each, Rfl: 2   budesonide-formoterol (SYMBICORT) 160-4.5 MCG/ACT inhaler, Inhale 2 puffs into the lungs in the morning and at bedtime., Disp: 1 each, Rfl: 5   citalopram (CELEXA) 20 MG tablet, TAKE 1 TABLET BY MOUTH ONCE DAILY IN THE MORNING, Disp: 90 tablet, Rfl: 1   Multiple Vitamin (MULTI VITAMIN DAILY PO), Take by mouth., Disp: , Rfl:    propranolol (INDERAL) 10 MG tablet, TAKE 1 TABLET BY MOUTH ONCE DAILY AS NEEDED (Patient taking differently: Take 10 mg by mouth daily as needed (anxiety).), Disp: 30 tablet, Rfl: 0   Tiotropium Bromide Monohydrate (SPIRIVA RESPIMAT) 2.5 MCG/ACT AERS, INHALE 2 PUFFS  BY MOUTH INTO THE LUNGS DAILY, Disp: 4 g, Rfl: 5 Medication Side Effects: none  Family Medical/ Social History: Changes? Older brother died. He's the last one living in his immediate family.   MENTAL HEALTH EXAM:  There were no vitals taken for this visit.There is no height or weight on file to calculate BMI.  General Appearance:   unable to assess  Eye Contact:   unable to assess  Speech:  Clear and Coherent and Normal Rate  Volume:  Normal  Mood:  Euthymic  Affect:   unable to assess  Thought Process:  Goal Directed and Descriptions of Associations: Circumstantial  Orientation:  Full (Time, Place, and Person)  Thought Content: Logical   Suicidal Thoughts:  No  Homicidal Thoughts:  No  Memory:  WNL  Judgement:  Good  Insight:  Good  Psychomotor Activity:   unable to assess  Concentration:  Concentration: Good and Attention Span: Good  Recall:  Good  Fund of Knowledge: Good  Language: Good  Assets:  Desire for Improvement Financial Resources/Insurance Housing Transportation  ADL's:  Intact  Cognition: WNL  Prognosis:  Good   DIAGNOSES:    ICD-10-CM   1. Recurrent major depressive disorder, in full remission ([place])  F33.42     2. Generalized anxiety disorder  F41.1      Receiving Psychotherapy: No   RECOMMENDATIONS:  PDMP reviewed.  No results available. I provided 25 minutes of non-face-to-face time during this encounter, including time spent before and after the visit in records review, medical decision making, counseling pertinent to today's visit, and charting.   He is doing really well on the Celexa and has been on the same dose for years.  We agreed to make no changes.  He prefers to have infrequent visits but will call if he is having any problems.  Continue Celexa 20 mg qd. Continue Propranolol 10 mg qid prn anxiety.  Takes it rarely. Return in 1 year.   Donnal Moat, PA-C

## 2022-08-19 DIAGNOSIS — H353111 Nonexudative age-related macular degeneration, right eye, early dry stage: Secondary | ICD-10-CM | POA: Diagnosis not present

## 2022-08-19 DIAGNOSIS — H353221 Exudative age-related macular degeneration, left eye, with active choroidal neovascularization: Secondary | ICD-10-CM | POA: Diagnosis not present

## 2022-08-25 ENCOUNTER — Telehealth: Payer: Medicare PPO | Admitting: Physician Assistant

## 2022-09-12 ENCOUNTER — Other Ambulatory Visit: Payer: Self-pay | Admitting: Physician Assistant

## 2022-09-22 DIAGNOSIS — H353111 Nonexudative age-related macular degeneration, right eye, early dry stage: Secondary | ICD-10-CM | POA: Diagnosis not present

## 2022-09-22 DIAGNOSIS — H353221 Exudative age-related macular degeneration, left eye, with active choroidal neovascularization: Secondary | ICD-10-CM | POA: Diagnosis not present

## 2022-09-22 DIAGNOSIS — H35433 Paving stone degeneration of retina, bilateral: Secondary | ICD-10-CM | POA: Diagnosis not present

## 2022-09-22 DIAGNOSIS — H43813 Vitreous degeneration, bilateral: Secondary | ICD-10-CM | POA: Diagnosis not present

## 2022-10-05 ENCOUNTER — Other Ambulatory Visit: Payer: Self-pay | Admitting: Pulmonary Disease

## 2022-10-10 IMAGING — MR MR HEAD W/O CM
6 of 10 series · 29 of 48 positions shown · non-contrast
Comparison: Head CT October 10, 2019.

CLINICAL DATA: Neuro deficit, acute, stroke suspected.

EXAM:
MRI HEAD WITHOUT CONTRAST
TECHNIQUE: Multiplanar, multiecho pulse sequences of the brain and surrounding
structures were obtained without intravenous contrast.

[Series 2: DWI · axial · 3.0mm · 0.94mm/px · z∈[-114,+31]mm · 9 of 98 slices shown (1 of 2)]
[im 1/98]
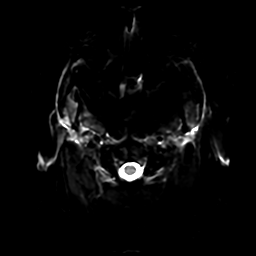
[im 13/98]
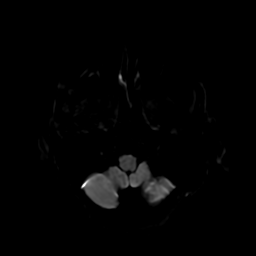
[im 25/98]
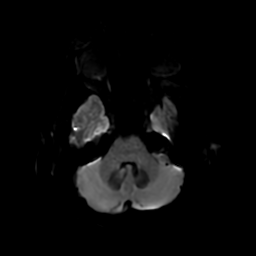
[im 37/98]
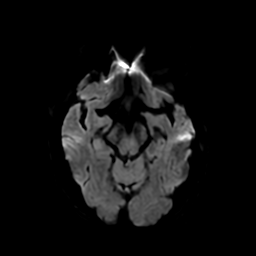
[im 49/98]
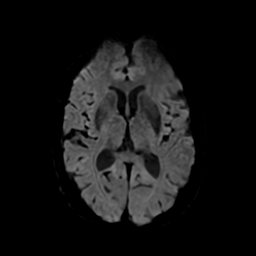
[im 61/98]
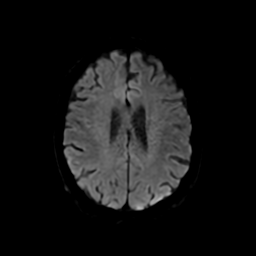
[im 73/98]
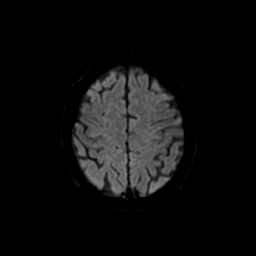
[im 85/98]
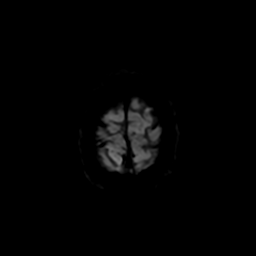
[im 98/98]
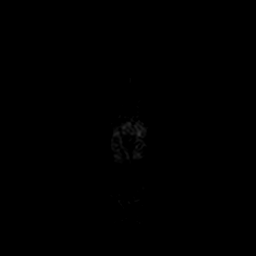

[Series 3: DWI · coronal · 4.0mm · 0.94mm/px · 7 of 76 slices shown (2 of 2)]
[im 1/76]
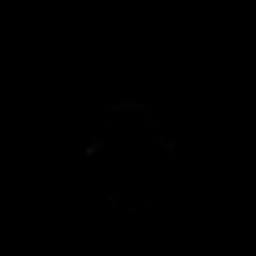
[im 13/76]
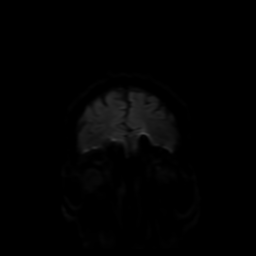
[im 26/76]
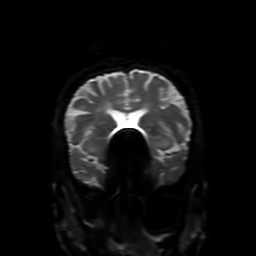
[im 38/76]
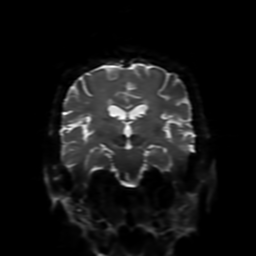
[im 51/76]
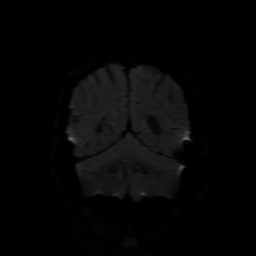
[im 63/76]
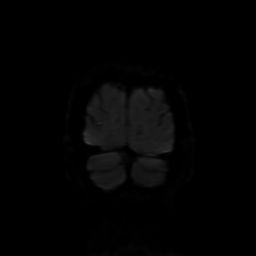
[im 76/76]
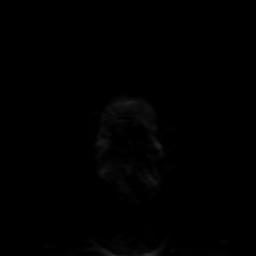

[Series 4: FLAIR · sagittal · 5.0mm · 0.23mm/px · 2 of 23 slices shown (1 of 2)]
[im 1/23]
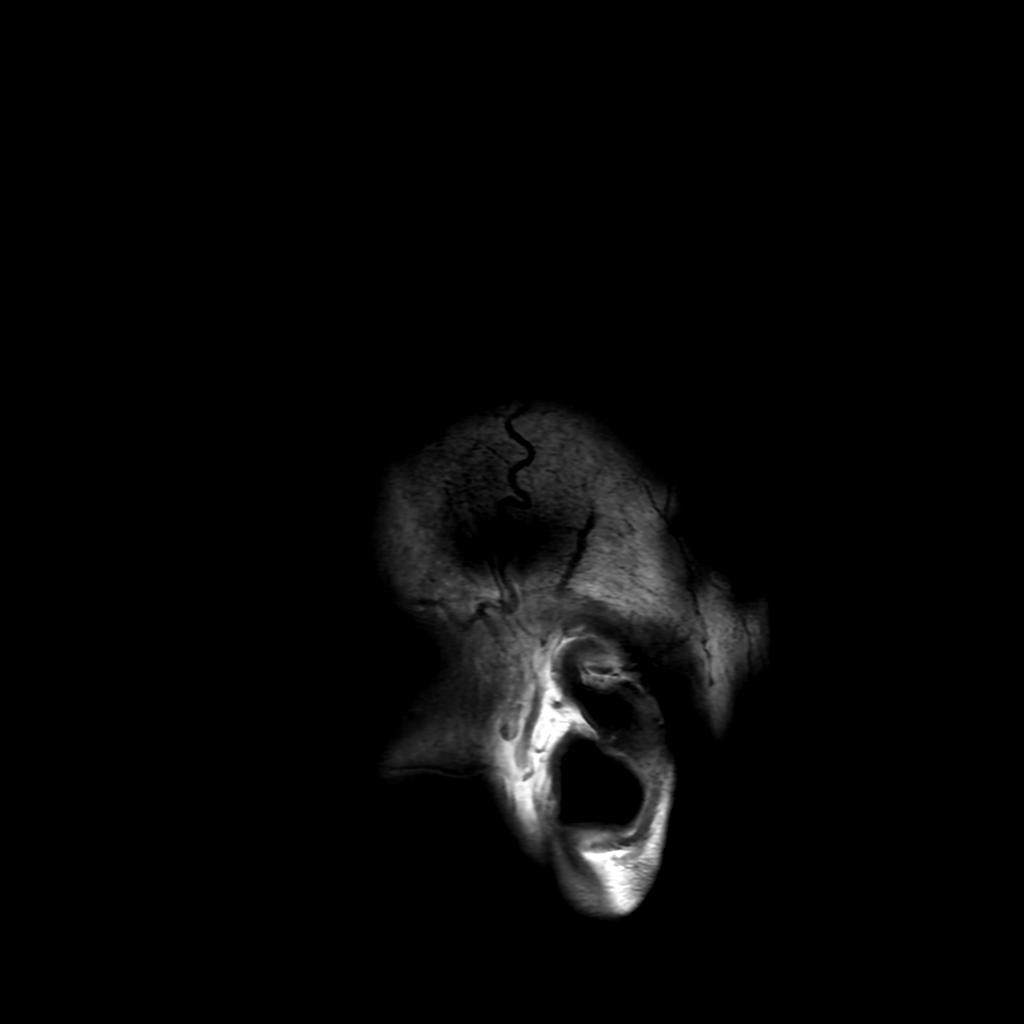
[im 23/23]
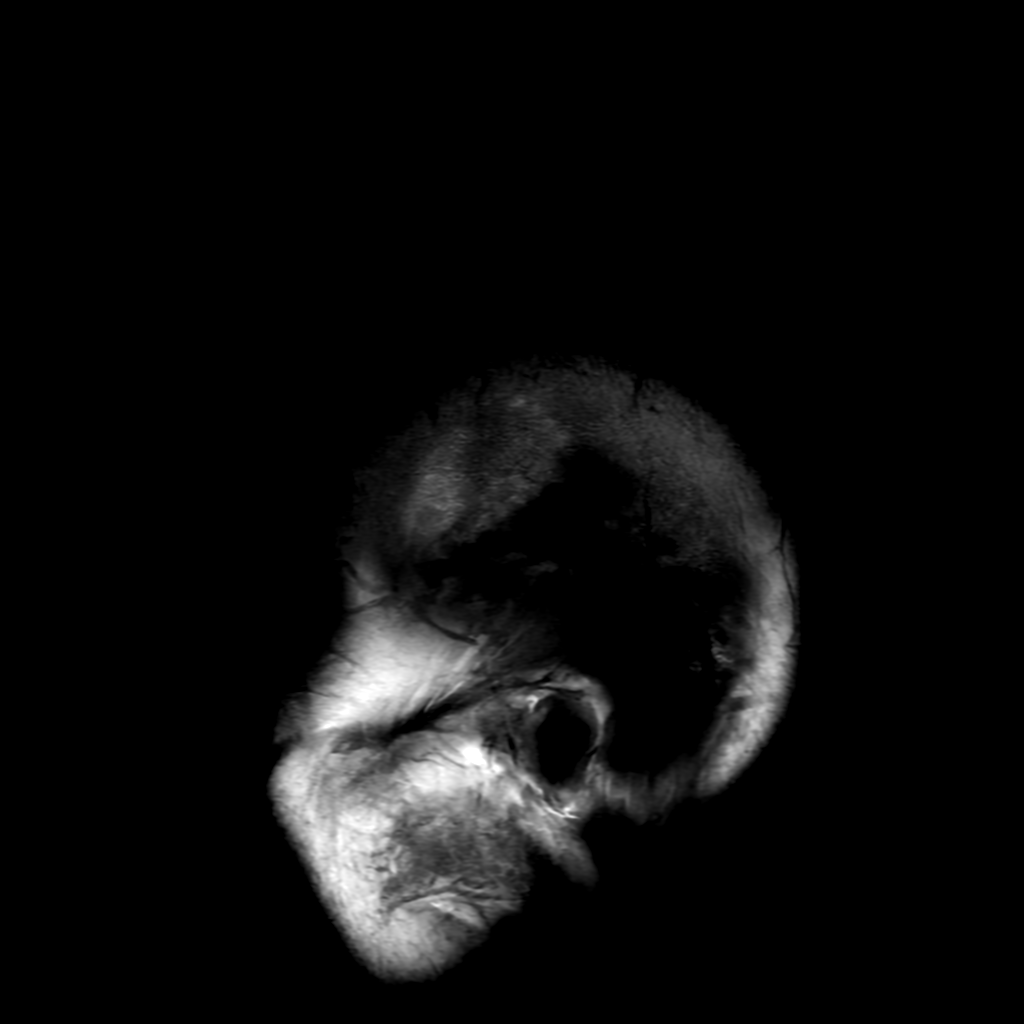

[Series 6: FLAIR · axial · 4.0mm · 0.45mm/px · z∈[-112,+31]mm · 3 of 34 slices shown (2 of 2)]
[im 1/34]
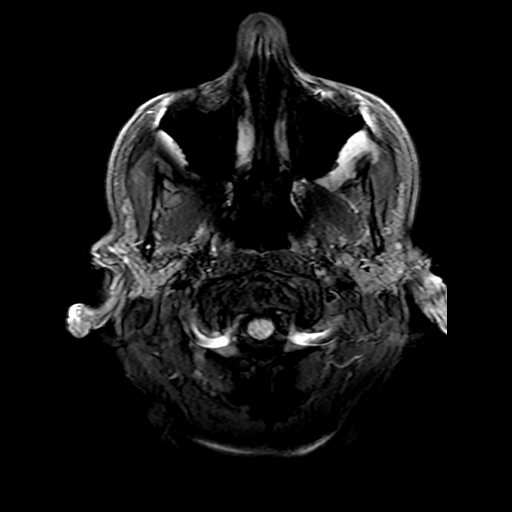
[im 17/34]
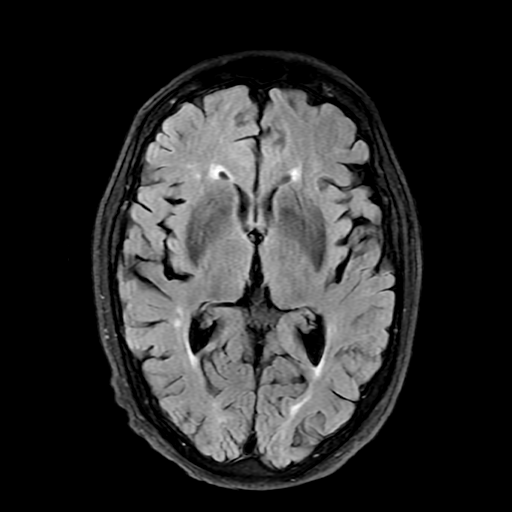
[im 34/34]
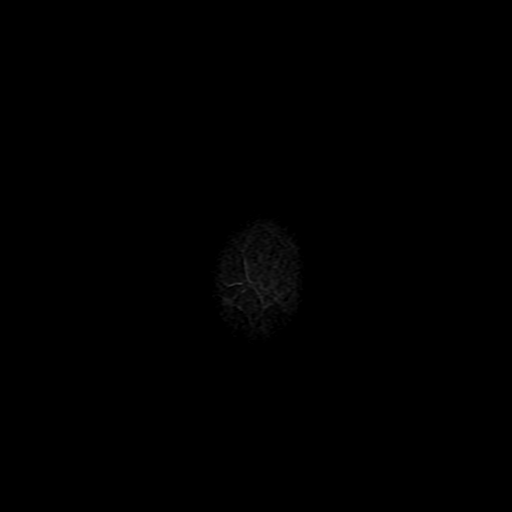

[Series 250: ADC · axial · 3.0mm · 0.94mm/px · z∈[-114,+31]mm · 5 of 50 slices shown (1 of 2)]
[im 1/50]
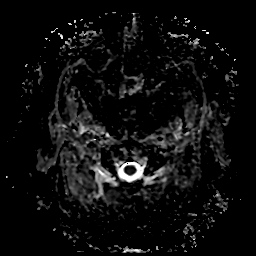
[im 13/50]
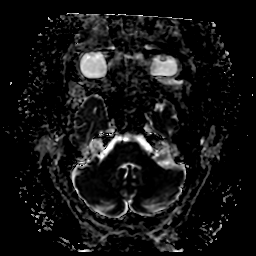
[im 25/50]
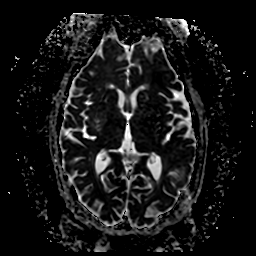
[im 37/50]
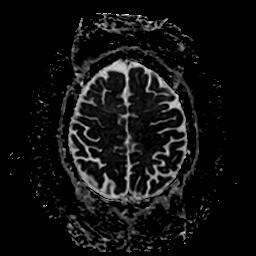
[im 50/50]
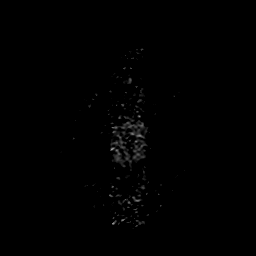

[Series 350: ADC · coronal · 4.0mm · 0.94mm/px · 3 of 37 slices shown (2 of 2)]
[im 1/37]
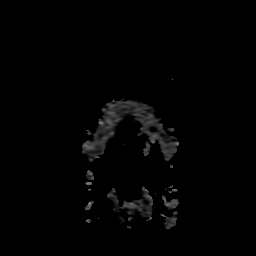
[im 19/37]
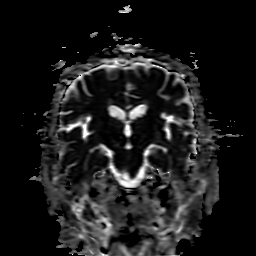
[im 37/37]
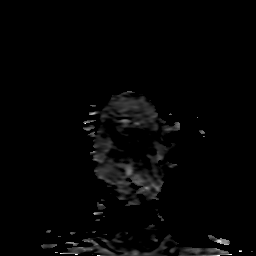

[29 of 48 positions shown; findings below may reference images not displayed]

FINDINGS: Brain: Small cortical focus of restricted diffusion in the right
frontal lobe (series 2, image 28). No scattered and confluent foci
of T2 hyperintensity are seen within the white matter of the
cerebral hemispheres and within the pons, nonspecific, most likely
related to chronic small vessel ischemia. Hemorrhage, hydrocephalus,
extra-axial collection or mass lesion.

Vascular: Normal flow voids.

Skull and upper cervical spine: Normal marrow signal.

Sinuses/Orbits: Negative.

Other: None.
IMPRESSION: 1. Small cortical focus of restricted diffusion within the right
frontal lobe consistent with acute/subacute infarct.
2. Moderate chronic small vessel ischemic changes of the white
matter.

These results will be called to the ordering clinician or
representative by the Radiologist Assistant, and communication
documented in the PACS or [REDACTED].

## 2022-10-10 IMAGING — CT CT ANGIO HEAD-NECK (W OR W/O PERF)
2 of 11 series · 7 of 33 positions shown · IV contrast (omnipaque)
Comparison: MRI from the same day.  CT head virtually 05/04/2019.

CLINICAL DATA: Stroke/TIA, assess extracranial arteries



[Series 7: cta neck · axial · 0.68mm/px · z∈[-284,-162]mm · 2 of 185 slices shown]
[im 62/185  soft-tissue]
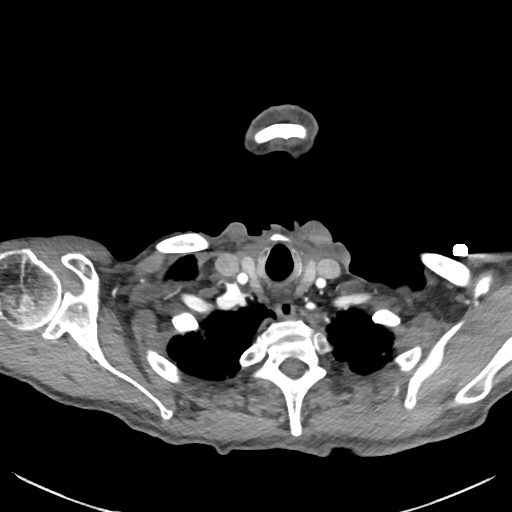
[im 123/185  bone]
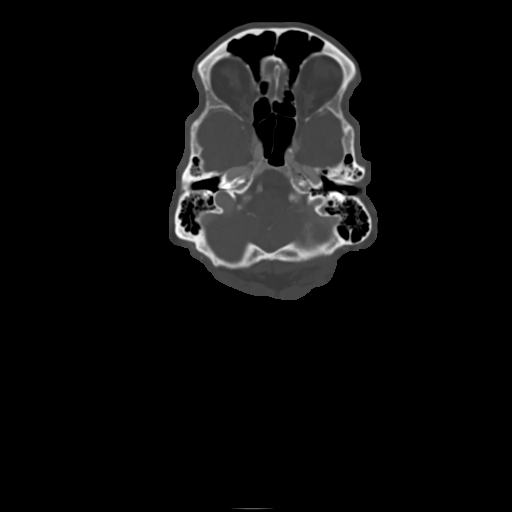

[Series 11: cta neck axial · axial · 0.39mm/px · z∈[-381,-146]mm · 5 of 366 slices shown]
[im 61/366  soft-tissue]
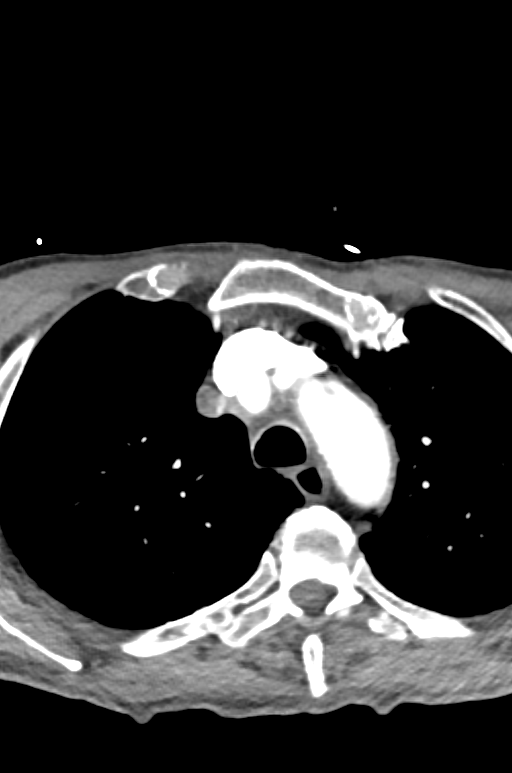
[im 122/366  soft-tissue]
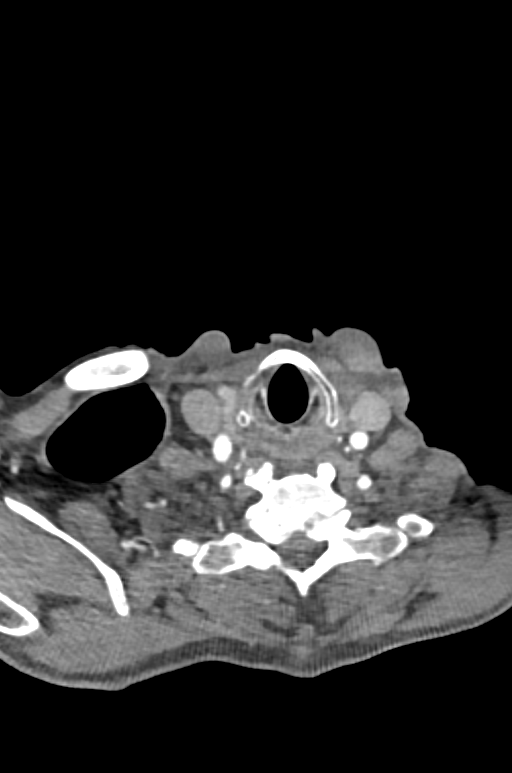
[im 183/366  soft-tissue]
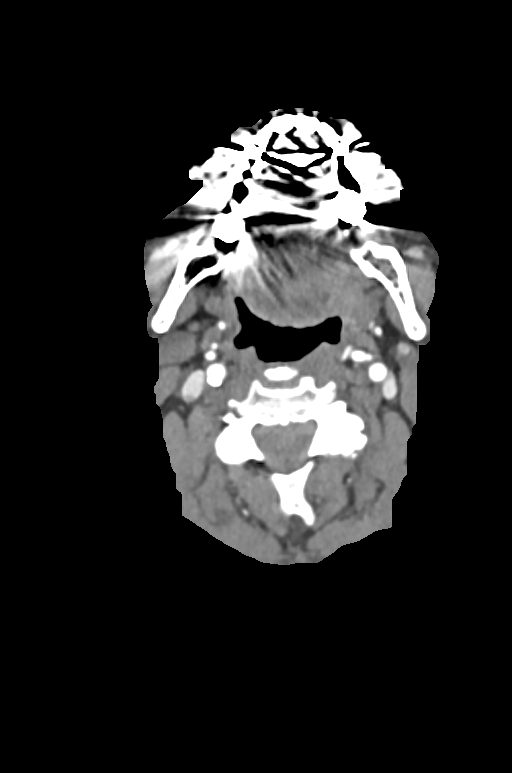
[im 244/366  soft-tissue]
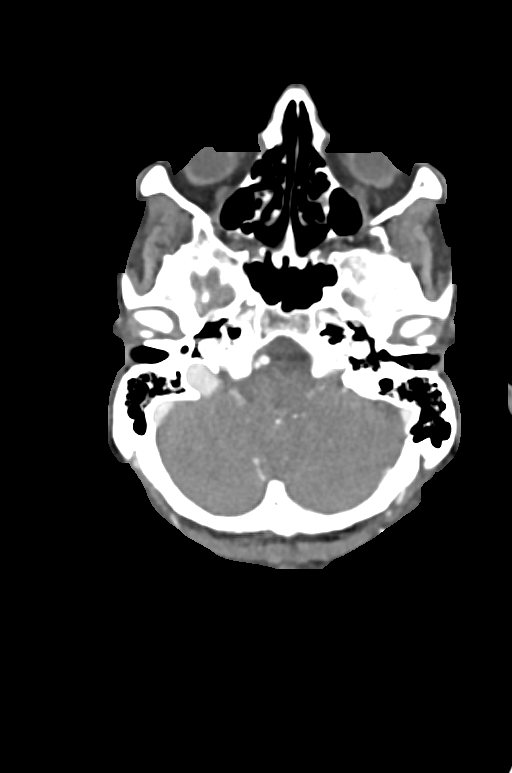
[im 305/366  soft-tissue]
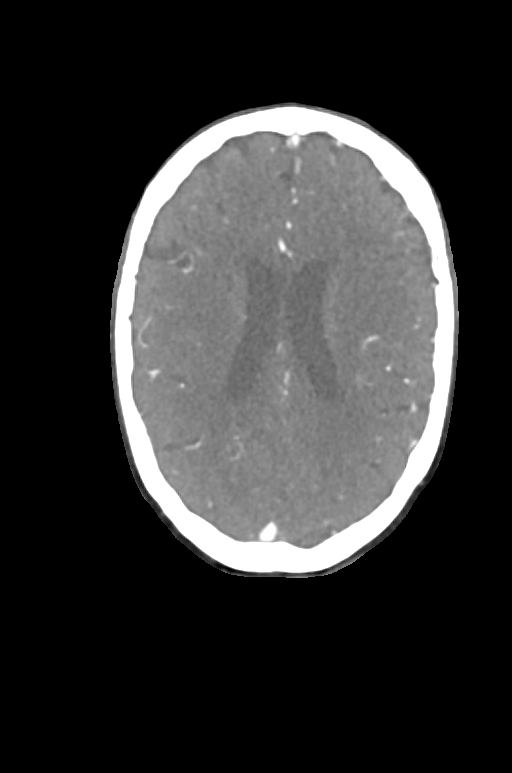

[7 of 33 positions shown; findings below may reference images not displayed]

FINDINGS: CT HEAD FINDINGS

Brain: Small cortical infarct along the right frontal lobe better
seen on same day MRI. No evidence of interval acute large vascular
territory infarction, acute hemorrhage, hydrocephalus, extra-axial
collection or mass lesion/mass effect. Mild to moderate patchy white
matter hypoattenuation, compatible with chronic microvascular
ischemic disease

Vascular: See below.

Skull: No acute fracture.

Sinuses: Visualized sinuses are largely clear.

Orbits: No acute finding.

Review of the MIP images confirms the above findings

CTA NECK FINDINGS

Aortic arch: Atherosclerosis.  Great vessel origins are patent.

Right carotid system: Patent. Predominately calcific atherosclerosis
at the carotid bifurcation without greater than 50% stenosis. Mildly
tortuous ICA.

Left carotid system: Patent. Predominantly calcific atherosclerosis
at the carotid bifurcation with approximately 50% stenosis of the
internal carotid artery origin. Mildly tortuous ICA.

Vertebral arteries: Left dominant. No significant (greater than 50%)
stenosis in the neck. Tortuous bilaterally.

Skeleton: Moderate to severe multilevel degenerative disc disease
with disc height loss, endplate sclerosis, and posterior endplate
spurring. Severe facet arthropathy in the upper cervical spine.

Other neck: No acute abnormality.

Upper chest: Severe paraseptal and centrilobular emphysema
bilaterally. Biapical pleuroparenchymal scarring. No consolidation.

Review of the MIP images confirms the above findings

CTA HEAD FINDINGS

Anterior circulation: Bilateral intracranial ICAs are patent. Mild
left greater than right ICA calcific atherosclerosis without greater
than 50% narrowing. Bilateral MCAs and ACAs are patent without
proximal hemodynamically significant stenosis. No aneurysm
identified.

Posterior circulation: Small/non dominant right intradural vertebral
artery which makes a large contribution to the right PICA, patent.
Left intradural vertebral artery is also patent without significant
stenosis. The basilar artery and posterior cerebral arteries are
patent without proximal hemodynamically significant stenosis. Right
fetal type PCA, anatomic variant. No aneurysm identified.

Venous sinuses: As permitted by contrast timing, patent.

Review of the MIP images confirms the above findings
IMPRESSION: CT head:

1. Small cortical infarct in the right frontal lobe better seen on
same day MRI.
2. No evidence of other/interval acute intracranial abnormality.
3. Mild to moderate chronic microvascular changes.

CTA Head:

No large vessel occlusion or proximal hemodynamically significant
stenosis.

CTA Neck:

1. Bilateral carotid bifurcation atherosclerosis with approximately
50% stenosis of the left internal carotid artery origin.
2. Severe emphysema.

## 2022-10-17 DIAGNOSIS — I700 Atherosclerosis of aorta: Secondary | ICD-10-CM | POA: Diagnosis not present

## 2022-10-17 DIAGNOSIS — Z8673 Personal history of transient ischemic attack (TIA), and cerebral infarction without residual deficits: Secondary | ICD-10-CM | POA: Diagnosis not present

## 2022-10-17 DIAGNOSIS — Z8719 Personal history of other diseases of the digestive system: Secondary | ICD-10-CM | POA: Diagnosis not present

## 2022-10-17 DIAGNOSIS — J449 Chronic obstructive pulmonary disease, unspecified: Secondary | ICD-10-CM | POA: Diagnosis not present

## 2022-10-17 DIAGNOSIS — F4310 Post-traumatic stress disorder, unspecified: Secondary | ICD-10-CM | POA: Diagnosis not present

## 2022-10-17 DIAGNOSIS — N400 Enlarged prostate without lower urinary tract symptoms: Secondary | ICD-10-CM | POA: Diagnosis not present

## 2022-10-17 DIAGNOSIS — L509 Urticaria, unspecified: Secondary | ICD-10-CM | POA: Diagnosis not present

## 2022-10-17 DIAGNOSIS — Z0000 Encounter for general adult medical examination without abnormal findings: Secondary | ICD-10-CM | POA: Diagnosis not present

## 2022-10-17 DIAGNOSIS — R7303 Prediabetes: Secondary | ICD-10-CM | POA: Diagnosis not present

## 2022-10-24 ENCOUNTER — Encounter (HOSPITAL_BASED_OUTPATIENT_CLINIC_OR_DEPARTMENT_OTHER): Payer: Self-pay

## 2022-10-24 ENCOUNTER — Ambulatory Visit (HOSPITAL_BASED_OUTPATIENT_CLINIC_OR_DEPARTMENT_OTHER)
Admission: RE | Admit: 2022-10-24 | Discharge: 2022-10-24 | Disposition: A | Discharge status: Home / Self Care | Payer: Medicare PPO | Source: Ambulatory Visit | Attending: Pulmonary Disease | Admitting: Pulmonary Disease

## 2022-10-24 DIAGNOSIS — R911 Solitary pulmonary nodule: Secondary | ICD-10-CM | POA: Insufficient documentation

## 2022-10-25 ENCOUNTER — Telehealth: Payer: Self-pay | Admitting: Pulmonary Disease

## 2022-10-25 DIAGNOSIS — R911 Solitary pulmonary nodule: Secondary | ICD-10-CM

## 2022-10-25 NOTE — Telephone Encounter (Signed)
Called and spoke with Merrily Pew and he states he already spoke to [place]. Nothing further needed

## 2022-10-25 NOTE — Telephone Encounter (Signed)
Call report from [place] at [place] imaging LDCT done 10/24/22  IMPRESSION: 1. Lung-RADS 4B, suspicious. Additional imaging evaluation or consultation with Pulmonology or Thoracic Surgery recommended. Irregular solid pulmonary nodule in the posterior aspect of the superior segment right lower lobe measures 13.3 mm in volume derived mean diameter, increased from 11.4 mm on 07/06/2022 screening chest CT, suspicious for primary bronchogenic carcinoma. 2. No thoracic adenopathy. 3. Three-vessel coronary atherosclerosis. 4. Aortic Atherosclerosis (ICD10-I70.0) and Emphysema (ICD10-J43.9).   These results will be called to the ordering clinician or representative by the Radiologist Assistant, and communication documented in the PACS or [employer].     Electronically Signed   By: Ilona Sorrel M.D.   On: 10/25/2022 13:49

## 2022-10-26 NOTE — Telephone Encounter (Signed)
Will need to hold on lung screening program. Concerned for primary bronchogenic carcinoma with slow growing lesion in RLL of the lung. Recent PET neg for mediastinal involvement.  Discussed with patient findings and given his poor baseline lung function, would be high risk for complications associated with bronchoscopy.  Will also contact [facility] regarding feasibility and risk of tissue sampling. Will message and refer to Dr. Sondra Come regarding candidacy for radiation.

## 2022-10-27 DIAGNOSIS — H353231 Exudative age-related macular degeneration, bilateral, with active choroidal neovascularization: Secondary | ICD-10-CM | POA: Diagnosis not present

## 2022-11-01 ENCOUNTER — Encounter (HOSPITAL_BASED_OUTPATIENT_CLINIC_OR_DEPARTMENT_OTHER): Payer: Self-pay | Admitting: Pulmonary Disease

## 2022-11-01 ENCOUNTER — Ambulatory Visit (INDEPENDENT_AMBULATORY_CARE_PROVIDER_SITE_OTHER): Payer: Medicare PPO | Admitting: Pulmonary Disease

## 2022-11-01 VITALS — BP 126/58 | HR 94 | Wt 181.6 lb

## 2022-11-01 DIAGNOSIS — J9612 Chronic respiratory failure with hypercapnia: Secondary | ICD-10-CM | POA: Diagnosis not present

## 2022-11-01 DIAGNOSIS — J449 Chronic obstructive pulmonary disease, unspecified: Secondary | ICD-10-CM

## 2022-11-01 MED ORDER — SPIRIVA RESPIMAT 2.5 MCG/ACT IN AERS
INHALATION_SPRAY | RESPIRATORY_TRACT | 5 refills | Status: DC
Start: 2022-11-01 — End: 2023-05-30

## 2022-11-01 MED ORDER — BUDESONIDE-FORMOTEROL FUMARATE 160-4.5 MCG/ACT IN AERO
2.0000 | INHALATION_SPRAY | Freq: Two times a day (BID) | RESPIRATORY_TRACT | 5 refills | Status: DC
Start: 2022-11-01 — End: 2023-05-30

## 2022-11-01 NOTE — Progress Notes (Unsigned)
Subjective:   PATIENT ID: Martin Mendez GENDER: male DOB: 1945-12-19, MRN: [id number]   HPI  Chief Complaint  Patient presents with   Follow-up    CT results    Reason for Visit: Follow-up  [name] is a 76 year old male former smoker with emphysema and PTSD who presents for follow-up  Synopsis: He was admitted from 06/05/2021 to 06/09/2021 for hematemesis.  EGD on 06/05/2021 demonstrated gastritis, duodenitis and nonbleeding duodenal ulcer.  His hospital course was complicated by acute respiratory failure with hypercapnia.  He was treated with noninvasive ventilation with improvement.  Pulmonary was consulted while inpatient and recommended noninvasive ventilation for outpatient management.  08/04/22 He is compliant with his vent. Based on peer to peer he should have NIV until November. He has had good results with his vent. Complaint with his bronchodilators. Denies significant shortness of breath, cough or wheezing. Will occasionally have cough. No exacerbations since 05/2021  12/49/23 Since our last visit he has been compliant with BiPAP He is being treated with ARED2 for AMD.  Prior inhalers: Incruse - prefers Spiriva  Social History: Former smoker  Past Medical History:  Diagnosis Date   COPD (chronic obstructive pulmonary disease) ([place])    PTSD (post-traumatic stress disorder)     Outpatient Medications Prior to Visit  Medication Sig Dispense Refill   acetaminophen (TYLENOL) 500 MG tablet Take 500 mg by mouth as needed.     albuterol (ACCUNEB) 0.63 MG/3ML nebulizer solution Take 3 mLs (0.63 mg total) by nebulization 2 (two) times daily. 180 mL 11   albuterol (VENTOLIN HFA) 108 (90 Base) MCG/ACT inhaler TAKE 2 PUFFS BY MOUTH EVERY 6 HOURS AS NEEDED FOR WHEEZING OR SHORTNESS OF BREATH 18 each 2   budesonide-formoterol (SYMBICORT) 160-4.5 MCG/ACT inhaler Inhale 2 puffs into the lungs in the morning and at bedtime. 1 each 5   citalopram (CELEXA) 20 MG tablet TAKE  1 TABLET BY MOUTH ONCE DAILY IN THE MORNING 90 tablet 0   Multiple Vitamin (MULTI VITAMIN DAILY PO) Take by mouth.     propranolol (INDERAL) 10 MG tablet TAKE 1 TABLET BY MOUTH ONCE DAILY AS NEEDED (Patient taking differently: Take 10 mg by mouth daily as needed (anxiety).) 30 tablet 0   Tiotropium Bromide Monohydrate (SPIRIVA RESPIMAT) 2.5 MCG/ACT AERS INHALE 2 PUFFS BY MOUTH INTO THE LUNGS DAILY 4 g 5   No facility-administered medications prior to visit.    Review of Systems  Constitutional:  Negative for chills, diaphoresis, fever, malaise/fatigue and weight loss.  HENT:  Negative for congestion.   Respiratory:  Positive for cough and sputum production. Negative for hemoptysis, shortness of breath and wheezing.   Cardiovascular:  Negative for chest pain, palpitations and leg swelling.     Objective:   Vitals:   11/01/22 1341  BP: (!) 126/58  Pulse: 94  SpO2: 93%  Weight: 181 lb 9.6 oz (82.4 kg)   SpO2: 93 % O2 Device: None (Room air)  Physical Exam: General: Well-appearing, no acute distress HENT: [place], AT Eyes: EOMI, no scleral icterus Respiratory: Clear to auscultation bilaterally.  No crackles, wheezing or rales Cardiovascular: RRR, -M/R/G, no JVD Extremities:-Edema,-tenderness Neuro: AAO x4, CNII-XII grossly intact Psych: Normal mood, normal affect  Data Reviewed:  Imaging: CTA 10/10/2019-moderate centrilobular emphysema.  Right upper lobe 3 mm nodular density, right lower lobe 3 mm nodule.  Left upper lobe subpleural 2 and 3 mm nodule.  Left lower lobe 3 mm nodule.  No aneurysm or dissection.  No  acute findings in chest abdomen or pelvis CT Chest Lung Cancer 07/06/22 - Aggressive appearing pulmonary nodule measuring 1.14 cm in superior segment of right lower lobe concerning for neoplasm PET/CT 07/25/22 - 55mm RLL nodule with minimal abnormal FDG avidity CT Chest 10/24/22 - 13 mm. Severe emphysema.  PFT: None on file  Labs: CBC    Component Value Date/Time    WBC 6.7 12/30/2021 1454   WBC 7.6 06/10/2021 0207   RBC 5.20 12/30/2021 1454   RBC 3.27 (L) 06/10/2021 0207   HGB 13.5 12/30/2021 1454   HCT 42.9 12/30/2021 1454   PLT 198 06/10/2021 0207   MCV 83 12/30/2021 1454   MCH 26.0 (L) 12/30/2021 1454   MCH 30.6 06/10/2021 0207   MCHC 31.5 12/30/2021 1454   MCHC 33.3 06/10/2021 0207   RDW 15.4 12/30/2021 1454   LYMPHSABS 1.5 12/30/2021 1454   MONOABS 0.6 06/05/2021 0321   EOSABS 0.4 12/30/2021 1454   BASOSABS 0.1 12/30/2021 1454   Absolute eosinophils 06/05/2021-300  ABG    Component Value Date/Time   PHART 7.325 (L) 06/08/2021 2200   PCO2ART 81.4 (HH) 06/08/2021 2200   PO2ART 105 06/08/2021 2200   HCO3 37.8 (H) 06/10/2021 1654   O2SAT 88.0 06/10/2021 1654   Interpretation: Acute on chronic hypercarbic respiratory failure  Assessment & Plan:   Discussion: 76 year old male with moderate emphysema and 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 No exacerbations in >1 year (05/2021). COPD symptoms overall well controlled on LAMA with occasional productive cough. Has declined flutter valve. Discussed nebulizer use for mucous clearance. Discussed clinical course and management of COPD including bronchodilator regimen and action plan for exacerbation. Reviewed CT imaging regarding borderline nodule with minimal FDG avidity. Discussed surveillance vs diagnostic testing. After shared decision will plan for repeat CT.  76 year old male   COPD, unspecified --CONTINUE Spiriva TWO puffs ONCE daily --CONTINUE Symbicort TWO puffs TWICE a day. Continue until you run out --CONTINUE Albuterol AS NEEDED for shortness of breath or wheezing --CONTINUE nebulizer and Albuterol nebulizer up to twice a day as needed for mucous clearance  Chronic hypercarbic respiratory failure secondary to COPD --CONTINUE BiPAP nightly  Right lower lobe nodule --Scheduled with Radiation Oncology  Tobacco cessation --Quit smoking July  2022  Health Maintenance Immunization History  Administered Date(s) Administered   Fluad Quad(high Dose 65+) 10/11/2021, 08/04/2022   Influenza-Unspecified 09/14/2008   PFIZER Comirnaty(Gray Top)Covid-19 Tri-Sucrose Vaccine 06/06/2021   Zoster Recombinat (Shingrix) 10/17/2022   CT Lung Screen- Refer to program  No orders of the defined types were placed in this encounter.  No orders of the defined types were placed in this encounter.  No follow-ups on file. After CT  I have spent a total time of36-minutes on the day of the appointment including chart review, data review, collecting history, coordinating care and discussing medical diagnosis and plan with the patient/family. Past medical history, allergies, medications were reviewed. Pertinent imaging, labs and tests included in this note have been reviewed and interpreted independently by me.   [place], MD [facility] 11/01/2022 1:48 PM  Office Number [phone]

## 2022-11-01 NOTE — Patient Instructions (Signed)
COPD, unspecified --CONTINUE Spiriva TWO puffs ONCE daily --CONTINUE Symbicort TWO puffs TWICE a day. Continue until you run out --CONTINUE Albuterol AS NEEDED for shortness of breath or wheezing --CONTINUE nebulizer and Albuterol nebulizer up to twice a day as needed for mucous clearance  Chronic hypercarbic respiratory failure secondary to COPD --CONTINUE BiPAP nightly  Right lower lobe nodule --Scheduled with Radiation Oncology  Follow-up with me in 6 months

## 2022-11-02 ENCOUNTER — Encounter (HOSPITAL_BASED_OUTPATIENT_CLINIC_OR_DEPARTMENT_OTHER): Payer: Self-pay | Admitting: Pulmonary Disease

## 2022-11-03 NOTE — Progress Notes (Signed)
Thoracic Location of Tumor / Histology:  Right lower lobe nodule  CT Chest Low Dose w/o Contrast  10/24/2022 --IMPRESSION: Lung-RADS 4B, suspicious. Additional imaging evaluation or consultation with Pulmonology or Thoracic Surgery recommended. Irregular solid pulmonary nodule in the posterior aspect of the superior segment right lower lobe measures 13.3 mm in volume derived mean diameter, increased from 11.4 mm on 07/06/2022 screening chest CT, suspicious for primary bronchogenic carcinoma. No thoracic adenopathy. Three-vessel coronary atherosclerosis. Aortic Atherosclerosis (ICD10-I70.0) and Emphysema (ICD10-J43.9)  PET Scan  07/25/2022 --IMPRESSION: Minimal abnormal FDG avidity in the 11 mm posterior right lower lobe pulmonary nodule, possibly reflecting a benign nodule or a very low-grade primary bronchogenic adenocarcinoma. Suggest short-term interval follow-up chest CT versus biopsy for further evaluation. Mild nodular hypermetabolic activity in the prostate gland slightly asymmetric to the right is nonspecific consider correlation with serum PSA and/or urology referral if clinically indicated. 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 Aortic Atherosclerosis (ICD10-I70.0) and Emphysema (ICD10-J43.9)  Biopsies revealed: N/A  Tobacco/Marijuana/Snuff/ETOH use: Patient is a former smoker (1 pack/day for ~60 years; quit in 05/2021). Denies any alcohol or recreational drug use  Past/Anticipated interventions by cardiothoracic surgery, if any:  11/01/2022 --Dr. Rayann Heman (office visit) Reviewed CT and PET imaging extensively.  Addressed patient and wife's questions.  Discussed bronchoscopy risks and benefits.  After discussion, patient prefers to minimize risk and discuss further management with radiation oncology.  Set patient's expectations that he may require additional imaging first.  Return in about 6 months (around  05/03/2023)   Past/Anticipated interventions by medical oncology, if any:  No referral placed at this time  Signs/Symptoms Weight changes, if any: denies Respiratory complaints, if any: reports an occasional productive cough Hemoptysis, if any: none Pain issues, if any:  denies  SAFETY ISSUES: Prior radiation? No Pacemaker/ICD? No  Possible current pregnancy? N/A Is the patient on methotrexate? No  Current Complaints / other details:  Nothing else of note

## 2022-11-08 NOTE — Progress Notes (Signed)
Radiation Oncology         [phone] ________________________________  Initial Outpatient Consultation  Name: Martin Mendez MRN: [id number]  Date: 11/09/2022  DOB: 17-Oct-1946  ZD:GLOVFI, Denton Ar, MD  Margaretha Seeds, MD   REFERRING PHYSICIAN: Margaretha Seeds, MD  DIAGNOSIS: The primary encounter diagnosis was Mass of lower lobe of right lung. A diagnosis of Right lower lobe pulmonary nodule was also pertinent to this visit.  Right lower lobe pulmonary nodule   HISTORY OF PRESENT ILLNESS::Martin Mendez is a 76 y.o. male former smoker. Today, he is accompanied by his wife. he is seen as a courtesy of Dr. Loanne Drilling for an opinion concerning radiation therapy as part of management for his recently diagnosed RLL pulmonary nodule.   The patient presented for a lung cancer screening CT on 07/06/22 which revealed an aggressive appearing pulmonary nodule in the superior segment of the right lower lobe, measuring approximately 11.4 mm in diameter, concerning for potential neoplasm, categorized as Lung-RADS 4BS, suspicious.   (Prior to his screening CT, the patient was already established with Dr. Loanne Drilling for management of COPD).   PET scan on 07/25/22 showed minimal abnormal FDG avidity in the 11 mm posterior right lower lobe pulmonary nodule, thus likely representing a benign nodule or a low-grade primary bronchogenic adenocarcinoma. PET also showed nonspecific mild nodular hypermetabolic activity in the prostate gland, slightly asymmetric to the right.   Based on PET/CT findings, Dr. Loanne Drilling recommended proceeding with follow-up imaging in 3 months for reassessment of the nodule.   Subsequent follow-up chest CT on 10/24/22 showed an increase in size of the RLL nodule to 13.3 mm, previously 11.4 mm. CT otherwise showed no thoracic adenopathy or other new or progressive findings.   During his most recent follow-up visit with pulmonology on 11/01/22, Dr. Loanne Drilling discussed the role of  bronchoscopy for further evaluation and biopsies of the nodule. However, given the patient's significant COPD he prefers to avoid the risks associated with undergoing bronchoscopy and requested to pursue radiation therapy without tissue diagnosis.  PFT: Spirometry 04/19/22 FVC 1.38 (33%) FEV1 0.72 (24%) Ratio 52   Interpretation: Very severe obstructive defect   PREVIOUS RADIATION THERAPY: No  PAST MEDICAL HISTORY:  Past Medical History:  Diagnosis Date   COPD (chronic obstructive pulmonary disease) ([place])    PTSD (post-traumatic stress disorder)     PAST SURGICAL HISTORY: Past Surgical History:  Procedure Laterality Date   ESOPHAGOGASTRODUODENOSCOPY (EGD) WITH PROPOFOL N/A 06/05/2021   Procedure: ESOPHAGOGASTRODUODENOSCOPY (EGD) WITH PROPOFOL;  Surgeon: [name], MD;  Location: [facility];  Service: Endoscopy;  Laterality: N/A;   IR ANGIOGRAM FOLLOW UP STUDY  06/07/2021   IR ANGIOGRAM SELECTIVE EACH ADDITIONAL VESSEL  06/07/2021   IR ANGIOGRAM VISCERAL SELECTIVE  06/07/2021   IR EMBO ART  VEN HEMORR LYMPH EXTRAV  INC GUIDE ROADMAPPING  06/07/2021   IR US GUIDE VASC ACCESS RIGHT  06/07/2021    FAMILY HISTORY: History reviewed. No pertinent family history.  SOCIAL HISTORY:  Social History   Tobacco Use   Smoking status: Former    Packs/day: 1.00    Years: 60.00    Total pack years: 60.00    Types: Cigarettes    Quit date: 06/04/2021    Years since quitting: 1.4   Smokeless tobacco: Never  Vaping Use   Vaping Use: Never used  Substance Use Topics   Alcohol use: No   Drug use: No    ALLERGIES:  Allergies  Allergen Reactions   Penicillins  Swelling    Did it involve swelling of the face/tongue/throat, SOB, or low BP? No Did it involve sudden or severe rash/hives, skin peeling, or any reaction on the inside of your mouth or nose? No Did you need to seek medical attention at a hospital or doctor's office? No When did it last happen?    29 years ago (age 60)   If all  above answers are "NO", may proceed with cephalosporin use.  Swelling of arms and legs   Dupixent [Dupilumab] Rash   Sulfa Antibiotics Rash    tongue    MEDICATIONS:  Current Outpatient Medications  Medication Sig Dispense Refill   dextromethorphan-guaiFENesin (MUCINEX DM) 30-600 MG 12hr tablet Take 1 tablet by mouth 2 (two) times daily as needed for cough.     Multiple Vitamins-Minerals (PRESERVISION AREDS 2 PO) Take 1 capsule by mouth daily.     acetaminophen (TYLENOL) 500 MG tablet Take 500 mg by mouth as needed.     albuterol (ACCUNEB) 0.63 MG/3ML nebulizer solution Take 3 mLs (0.63 mg total) by nebulization 2 (two) times daily. 180 mL 11   albuterol (VENTOLIN HFA) 108 (90 Base) MCG/ACT inhaler TAKE 2 PUFFS BY MOUTH EVERY 6 HOURS AS NEEDED FOR WHEEZING OR SHORTNESS OF BREATH 18 each 2   budesonide-formoterol (SYMBICORT) 160-4.5 MCG/ACT inhaler Inhale 2 puffs into the lungs in the morning and at bedtime. 1 each 5   citalopram (CELEXA) 20 MG tablet TAKE 1 TABLET BY MOUTH ONCE DAILY IN THE MORNING 90 tablet 0   Multiple Vitamin (MULTI VITAMIN DAILY PO) Take by mouth.     propranolol (INDERAL) 10 MG tablet TAKE 1 TABLET BY MOUTH ONCE DAILY AS NEEDED (Patient taking differently: Take 10 mg by mouth daily as needed (anxiety).) 30 tablet 0   Tiotropium Bromide Monohydrate (SPIRIVA RESPIMAT) 2.5 MCG/ACT AERS INHALE 2 PUFFS BY MOUTH INTO THE LUNGS DAILY 4 g 5   No current facility-administered medications for this encounter.    REVIEW OF SYSTEMS:  A 10+ POINT REVIEW OF SYSTEMS WAS OBTAINED including neurology, dermatology, psychiatry, cardiac, respiratory, lymph, extremities, GI, GU, musculoskeletal, constitutional, reproductive, HEENT.  Reports becoming significantly short of breath with walking approximately 100 yards.  He uses oxygen at night with his CPAP.  He denies any pain within the chest area significant cough or hemoptysis.   PHYSICAL EXAM:  height is 5\' 10"  (1.778 m) and weight is  182 lb 12.8 oz (82.9 kg). His temperature is 97.9 F (36.6 C). His blood pressure is 121/78 and his pulse is 85. His respiration is 20 and oxygen saturation is 94%.   General: Alert and oriented, in no acute distress HEENT: Head is normocephalic. Extraocular movements are intact.  Neck: Neck is supple, no palpable cervical or supraclavicular lymphadenopathy. Heart: Regular in rate and rhythm with no murmurs, rubs, or gallops. Chest: Clear to auscultation bilaterally, with no rhonchi, wheezes, or rales. Abdomen: Soft, nontender, nondistended, with no rigidity or guarding. Extremities: No cyanosis or edema. Lymphatics: see Neck Exam Skin: No concerning lesions. Musculoskeletal: symmetric strength and muscle tone throughout. Neurologic: Cranial nerves II through XII are grossly intact. No obvious focalities. Speech is fluent. Coordination is intact. Psychiatric: Judgment and insight are intact. Affect is appropriate.   ECOG = 1  0 - Asymptomatic (Fully active, able to carry on all predisease activities without restriction)  1 - Symptomatic but completely ambulatory (Restricted in physically strenuous activity but ambulatory and able to carry out work of a light or sedentary nature.  For example, light housework, office work)  2 - Symptomatic, <50% in bed during the day (Ambulatory and capable of all self care but unable to carry out any work activities. Up and about more than 50% of waking hours)  3 - Symptomatic, >50% in bed, but not bedbound (Capable of only limited self-care, confined to bed or chair 50% or more of waking hours)  4 - Bedbound (Completely disabled. Cannot carry on any self-care. Totally confined to bed or chair)  5 - Death   Eustace Pen MM, Creech RH, Tormey DC, et al. [phone]). "Toxicity and response criteria of the [facility]". [place] Oncol. 5 (6): 649-55  LABORATORY DATA:  Lab Results  Component Value Date   WBC 6.7 12/30/2021   HGB 13.5  12/30/2021   HCT 42.9 12/30/2021   MCV 83 12/30/2021   PLT 198 06/10/2021   NEUTROABS 4.1 12/30/2021   Lab Results  Component Value Date   NA 139 12/30/2021   K 5.1 12/30/2021   CL 100 12/30/2021   CO2 28 12/30/2021   GLUCOSE 75 12/30/2021   BUN 17 12/30/2021   CREATININE 0.80 12/30/2021   CALCIUM 9.4 12/30/2021      RADIOGRAPHY: CT CHEST LCS NODULE F/U LOW DOSE WO CONTRAST  Result Date: 10/25/2022 CLINICAL DATA:  76 year old asymptomatic male former smoker presents for three-month follow-up. EXAM: CT CHEST WITHOUT CONTRAST FOR LUNG CANCER SCREENING NODULE FOLLOW-UP TECHNIQUE: Multidetector CT imaging of the chest was performed following the standard protocol without IV contrast. RADIATION DOSE REDUCTION: This exam was performed according to the departmental dose-optimization program which includes automated exposure control, adjustment of the mA and/or kV according to patient size and/or use of iterative reconstruction technique. COMPARISON:  07/25/2022 PET-CT.  07/06/2022 screening chest CT. FINDINGS: Cardiovascular: Normal heart size. No significant pericardial effusion/thickening. Three-vessel coronary atherosclerosis. Atherosclerotic nonaneurysmal thoracic aorta. Normal caliber pulmonary arteries. Mediastinum/Nodes: No significant thyroid nodules. Unremarkable esophagus. No pathologically enlarged axillary, mediastinal or hilar lymph nodes, noting limited sensitivity for the detection of hilar adenopathy on this noncontrast study. Lungs/Pleura: No pneumothorax. No pleural effusion. Severe centrilobular emphysema with diffuse bronchial wall thickening. No acute consolidative airspace disease or lung masses. Irregular solid pulmonary nodule in the posterior aspect of the superior segment right lower lobe measures 13.3 mm in volume derived mean diameter (series 3/image 136), increased from 11.4 mm on 07/06/2022 screening chest CT. Stable solid posterior right upper lobe 5 mm pulmonary nodule  (series 3/image 99). No new significant pulmonary nodules. 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 A few scattered stable small simple liver cysts, largest 1.3 cm at the right liver dome. Musculoskeletal: No aggressive appearing focal osseous lesions. Mild thoracic spondylosis. IMPRESSION: 1. Lung-RADS 4B, suspicious. Additional imaging evaluation or consultation with Pulmonology or Thoracic Surgery recommended. Irregular solid pulmonary nodule in the posterior aspect of the superior segment right lower lobe measures 13.3 mm in volume derived mean diameter, increased from 11.4 mm on 07/06/2022 screening chest CT, suspicious for primary bronchogenic carcinoma. 2. No thoracic adenopathy. 3. Three-vessel coronary atherosclerosis. 4. Aortic Atherosclerosis (ICD10-I70.0) and Emphysema (ICD10-J43.9). These results will be called to the ordering clinician or representative by the Radiologist Assistant, and communication documented in the PACS or [employer]. Electronically Signed   By: Ilona Sorrel M.D.   On: 10/25/2022 13:49      IMPRESSION: Solitary right lower lobe pulmonary nodule suspicious for primary bronchogenic carcinoma  Imaging characteristics are consistent with a early stage bronchogenic carcinoma.  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  I discussed potential evaluation with bronchoscopy and biopsy.  Given the patient's breathing situation with very severe obstructive defect on PFT's and history of PTSD he does not wish to pursue potential bronchoscopy and biopsy.  He also does not wish to consider potential CT-guided biopsy.  He is not a good candidate for surgery in light of above issues.  He would be a good candidate for SBRT directed at his solitary nodule in the right lower lobe.  I discussed the general course of treatment, anticipated side effects and  potential long-term effects of SBRT.  He appears to understand and wishes to proceed with planned course of treatment   PLAN: He is scheduled for CT simulation December 28 at 1 PM.  Treatments to begin approximately 7 to 10 days later.  Anticipate 3 SBRT treatments directed at the right lower lobe pulmonary nodule.   60 minutes of total time was spent for this patient encounter, including preparation, face-to-face counseling with the patient and coordination of care, physical exam, and documentation of the encounter.   ------------------------------------------------  Blair Promise, PhD, MD  This document serves as a record of services personally performed by Gery Pray, MD. It was created on his behalf by Roney Mans, a trained medical scribe. The creation of this record is based on the scribe's personal observations and the provider's statements to them. This document has been checked and approved by the attending provider.

## 2022-11-09 ENCOUNTER — Encounter: Payer: Self-pay | Admitting: Radiation Oncology

## 2022-11-09 ENCOUNTER — Ambulatory Visit
Admission: RE | Admit: 2022-11-09 | Discharge: 2022-11-09 | Disposition: A | Discharge status: Home / Self Care | Payer: Medicare PPO | Source: Ambulatory Visit | Attending: Radiation Oncology | Admitting: Radiation Oncology

## 2022-11-09 VITALS — BP 121/78 | HR 85 | Temp 97.9°F | Resp 20 | Ht 70.0 in | Wt 182.8 lb

## 2022-11-09 DIAGNOSIS — J449 Chronic obstructive pulmonary disease, unspecified: Secondary | ICD-10-CM | POA: Insufficient documentation

## 2022-11-09 DIAGNOSIS — R918 Other nonspecific abnormal finding of lung field: Secondary | ICD-10-CM

## 2022-11-09 DIAGNOSIS — K7689 Other specified diseases of liver: Secondary | ICD-10-CM | POA: Diagnosis not present

## 2022-11-09 DIAGNOSIS — Z79899 Other long term (current) drug therapy: Secondary | ICD-10-CM | POA: Insufficient documentation

## 2022-11-09 DIAGNOSIS — J432 Centrilobular emphysema: Secondary | ICD-10-CM | POA: Insufficient documentation

## 2022-11-09 DIAGNOSIS — R911 Solitary pulmonary nodule: Secondary | ICD-10-CM

## 2022-11-09 DIAGNOSIS — Z7951 Long term (current) use of inhaled steroids: Secondary | ICD-10-CM | POA: Diagnosis not present

## 2022-11-10 ENCOUNTER — Ambulatory Visit
Admission: RE | Admit: 2022-11-10 | Discharge: 2022-11-10 | Disposition: A | Discharge status: Home / Self Care | Payer: Medicare PPO | Source: Ambulatory Visit | Attending: Radiation Oncology | Admitting: Radiation Oncology

## 2022-11-10 ENCOUNTER — Telehealth: Payer: Self-pay

## 2022-11-10 ENCOUNTER — Other Ambulatory Visit: Payer: Self-pay

## 2022-11-10 DIAGNOSIS — R918 Other nonspecific abnormal finding of lung field: Secondary | ICD-10-CM | POA: Diagnosis not present

## 2022-11-10 DIAGNOSIS — R911 Solitary pulmonary nodule: Secondary | ICD-10-CM | POA: Diagnosis not present

## 2022-11-10 NOTE — Telephone Encounter (Signed)
Patient is requesting a call from Dr Ernst Bowler. I asked him what it was in regards to and he states he would rather speak with Dr. Ernst Bowler directly.

## 2022-11-11 NOTE — Telephone Encounter (Signed)
Per provider request -   Called patient - DOB verified - stated he wanted to know purpose of 11/24/22 appt - advised 6 mth f/u.  Patient advised he's seeing Dr. Loanne Drilling - no need/reason for follow up - request appt to be canceled.  Per patient request - canceled 11/24/22 appt.  Forwarding message to provider as update.

## 2022-11-11 NOTE — Telephone Encounter (Signed)
I had f/u as needed anyway. So that is fine with me. Thanks!   Salvatore Marvel, MD [facility]

## 2022-11-11 NOTE — Telephone Encounter (Signed)
Can an MA call to see what this is about?   Salvatore Marvel, MD [facility] [place]

## 2022-11-14 DIAGNOSIS — J449 Chronic obstructive pulmonary disease, unspecified: Secondary | ICD-10-CM | POA: Diagnosis not present

## 2022-11-15 DIAGNOSIS — R918 Other nonspecific abnormal finding of lung field: Secondary | ICD-10-CM | POA: Insufficient documentation

## 2022-11-15 DIAGNOSIS — C3431 Malignant neoplasm of lower lobe, right bronchus or lung: Secondary | ICD-10-CM | POA: Insufficient documentation

## 2022-11-15 DIAGNOSIS — Z510 Encounter for antineoplastic radiation therapy: Secondary | ICD-10-CM | POA: Insufficient documentation

## 2022-11-15 DIAGNOSIS — R911 Solitary pulmonary nodule: Secondary | ICD-10-CM | POA: Insufficient documentation

## 2022-11-16 DIAGNOSIS — J449 Chronic obstructive pulmonary disease, unspecified: Secondary | ICD-10-CM | POA: Diagnosis not present

## 2022-11-22 ENCOUNTER — Ambulatory Visit: Payer: Medicare PPO | Admitting: Radiation Oncology

## 2022-11-24 ENCOUNTER — Ambulatory Visit
Admission: RE | Admit: 2022-11-24 | Discharge: 2022-11-24 | Disposition: A | Discharge status: Home / Self Care | Payer: Medicare PPO | Source: Ambulatory Visit | Attending: Radiation Oncology | Admitting: Radiation Oncology

## 2022-11-24 ENCOUNTER — Ambulatory Visit: Payer: Medicare PPO | Admitting: Allergy & Immunology

## 2022-11-24 ENCOUNTER — Other Ambulatory Visit: Payer: Self-pay

## 2022-11-24 DIAGNOSIS — R911 Solitary pulmonary nodule: Secondary | ICD-10-CM

## 2022-11-24 DIAGNOSIS — Z510 Encounter for antineoplastic radiation therapy: Secondary | ICD-10-CM | POA: Diagnosis not present

## 2022-11-24 LAB — RAD ONC ARIA SESSION SUMMARY
Course Elapsed Days: 0
Plan Fractions Treated to Date: 1
Plan Prescribed Dose Per Fraction: 18 Gy
Plan Total Fractions Prescribed: 3
Plan Total Prescribed Dose: 54 Gy
Reference Point Dosage Given to Date: 18 Gy
Reference Point Session Dosage Given: 18 Gy
Session Number: 1

## 2022-11-29 ENCOUNTER — Other Ambulatory Visit: Payer: Self-pay

## 2022-11-29 ENCOUNTER — Ambulatory Visit: Payer: Medicare PPO

## 2022-11-29 ENCOUNTER — Ambulatory Visit
Admission: RE | Admit: 2022-11-29 | Discharge: 2022-11-29 | Disposition: A | Discharge status: Home / Self Care | Payer: Medicare PPO | Source: Ambulatory Visit | Attending: Radiation Oncology | Admitting: Radiation Oncology

## 2022-11-29 DIAGNOSIS — R911 Solitary pulmonary nodule: Secondary | ICD-10-CM

## 2022-11-29 DIAGNOSIS — Z510 Encounter for antineoplastic radiation therapy: Secondary | ICD-10-CM | POA: Diagnosis not present

## 2022-11-29 LAB — RAD ONC ARIA SESSION SUMMARY
Course Elapsed Days: 5
Plan Fractions Treated to Date: 2
Plan Prescribed Dose Per Fraction: 18 Gy
Plan Total Fractions Prescribed: 3
Plan Total Prescribed Dose: 54 Gy
Reference Point Dosage Given to Date: 36 Gy
Reference Point Session Dosage Given: 18 Gy
Session Number: 2

## 2022-12-01 ENCOUNTER — Other Ambulatory Visit: Payer: Self-pay

## 2022-12-01 ENCOUNTER — Ambulatory Visit
Admission: RE | Admit: 2022-12-01 | Discharge: 2022-12-01 | Disposition: A | Discharge status: Home / Self Care | Payer: Medicare PPO | Source: Ambulatory Visit | Attending: Radiation Oncology | Admitting: Radiation Oncology

## 2022-12-01 DIAGNOSIS — R918 Other nonspecific abnormal finding of lung field: Secondary | ICD-10-CM | POA: Diagnosis not present

## 2022-12-01 DIAGNOSIS — Z510 Encounter for antineoplastic radiation therapy: Secondary | ICD-10-CM | POA: Diagnosis not present

## 2022-12-01 DIAGNOSIS — R911 Solitary pulmonary nodule: Secondary | ICD-10-CM

## 2022-12-01 LAB — RAD ONC ARIA SESSION SUMMARY
Course Elapsed Days: 7
Plan Fractions Treated to Date: 3
Plan Prescribed Dose Per Fraction: 18 Gy
Plan Total Fractions Prescribed: 3
Plan Total Prescribed Dose: 54 Gy
Reference Point Dosage Given to Date: 54 Gy
Reference Point Session Dosage Given: 18 Gy
Session Number: 3

## 2022-12-07 DIAGNOSIS — J449 Chronic obstructive pulmonary disease, unspecified: Secondary | ICD-10-CM | POA: Diagnosis not present

## 2022-12-07 DIAGNOSIS — J9612 Chronic respiratory failure with hypercapnia: Secondary | ICD-10-CM | POA: Diagnosis not present

## 2022-12-07 DIAGNOSIS — G4733 Obstructive sleep apnea (adult) (pediatric): Secondary | ICD-10-CM | POA: Diagnosis not present

## 2022-12-08 DIAGNOSIS — H353221 Exudative age-related macular degeneration, left eye, with active choroidal neovascularization: Secondary | ICD-10-CM | POA: Diagnosis not present

## 2022-12-08 NOTE — Radiation Completion Notes (Signed)
Patient Name: Martin Mendez, Martin Mendez MRN: [id number] Date of Birth: 04/05/1946 Referring Physician: Rayann Heman, M.D. Date of Service: 2022-12-08 Radiation Oncologist: Teryl Lucy, M.D. [place] END OF TREATMENT NOTE     Diagnosis: R91.8 Other nonspecific abnormal finding of lung field Intent: Curative     ==========DELIVERED PLANS==========  First Treatment Date: 2022-11-24 - Last Treatment Date: 2022-12-01   Plan Name: Lung_R_SBRT Site: Lung, Right Technique: SBRT/SRT-IMRT Mode: Photon Dose Per Fraction: 18 Gy Prescribed Dose (Delivered / Prescribed): 54 Gy / 54 Gy Prescribed Fxs (Delivered / Prescribed): 3 / 3     ==========ON TREATMENT VISIT DATES========== 2022-11-24, 2022-11-29, 2022-11-29, 2022-12-01     ==========UPCOMING VISITS==========       ==========APPENDIX - ON TREATMENT VISIT NOTES==========   See weekly On Treatment Notes is Epic for details.

## 2022-12-09 ENCOUNTER — Other Ambulatory Visit: Payer: Self-pay | Admitting: Physician Assistant

## 2022-12-09 DIAGNOSIS — J9612 Chronic respiratory failure with hypercapnia: Secondary | ICD-10-CM | POA: Diagnosis not present

## 2022-12-09 DIAGNOSIS — J449 Chronic obstructive pulmonary disease, unspecified: Secondary | ICD-10-CM | POA: Diagnosis not present

## 2022-12-09 DIAGNOSIS — G4733 Obstructive sleep apnea (adult) (pediatric): Secondary | ICD-10-CM | POA: Diagnosis not present

## 2022-12-10 DIAGNOSIS — J9612 Chronic respiratory failure with hypercapnia: Secondary | ICD-10-CM | POA: Diagnosis not present

## 2022-12-10 DIAGNOSIS — J449 Chronic obstructive pulmonary disease, unspecified: Secondary | ICD-10-CM | POA: Diagnosis not present

## 2022-12-10 DIAGNOSIS — G4733 Obstructive sleep apnea (adult) (pediatric): Secondary | ICD-10-CM | POA: Diagnosis not present

## 2022-12-14 ENCOUNTER — Telehealth: Payer: Self-pay

## 2022-12-14 NOTE — Telephone Encounter (Signed)
Notified Patient of completion of Cancer Claim Form. Fax transmission confirmation received. Advised Patient of need to obtain copies of billing statements and provided information on how to obtain. Understanding voiced. Copy of form mailed to Patient as requested. No other needs or concerns voiced at this time.

## 2022-12-15 DIAGNOSIS — J449 Chronic obstructive pulmonary disease, unspecified: Secondary | ICD-10-CM | POA: Diagnosis not present

## 2022-12-17 DIAGNOSIS — J449 Chronic obstructive pulmonary disease, unspecified: Secondary | ICD-10-CM | POA: Diagnosis not present

## 2023-01-02 NOTE — Progress Notes (Signed)
Martin Mendez is here today for follow up post radiation to the lung.  Lung Side: RT Lung  Does the patient complain of any of the following: Pain: None Shortness of breath w/wo exertion: Yes, occasionally w/ exertion, worsened by COPD. Cough: Yes, every few days w/ thick, white mucus. Hemoptysis: None Pain with swallowing: None Swallowing/choking concerns: None Appetite: Good Energy Level: Fair Post radiation skin Changes: None    Additional comments if applicable: None  This concludes the interview.   Leandra Kern, LPN

## 2023-01-05 ENCOUNTER — Encounter: Payer: Self-pay | Admitting: Radiation Oncology

## 2023-01-06 NOTE — Progress Notes (Signed)
  Radiation Oncology         [phone] ________________________________  Patient Name: Martin Mendez MRN: [id number] DOB: [birth year] Referring Physician: Loanne Drilling [facility] Date of Service: 12/01/2022 [facility], [place]                                                        End Of Treatment Note  Diagnoses: R91.8-Other nonspecific abnormal finding of lung field  Staging: The primary encounter diagnosis was Mass of lower lobe of right lung. A diagnosis of Right lower lobe pulmonary nodule was also pertinent to this visit.   Right lower lobe pulmonary nodule   Intent: Curative  Radiation Treatment Dates: 11/24/2022 through 12/01/2022 Site Technique Total Dose (Gy) Dose per Fx (Gy) Completed Fx Beam Energies  Lung, Right: Lung_R IMRT 54/54 18 3/3 6XFFF   Narrative: The patient tolerated radiation therapy relatively well overall without any significant side effects other than a mild productive cough.   Plan: The patient will follow-up with radiation oncology in one month .  ________________________________________________ -----------------------------------  Blair Promise, PhD, MD  This document serves as a record of services personally performed by Gery Pray, MD. It was created on his behalf by Roney Mans, a trained medical scribe. The creation of this record is based on the scribe's personal observations and the provider's statements to them. This document has been checked and approved by the attending provider.

## 2023-01-07 NOTE — Progress Notes (Signed)
Radiation Oncology         [phone] ________________________________  Name: Martin Mendez MRN: [id number]  Date: 01/09/2023  DOB: Aug 23, 1946  Follow-Up Visit Note  CC: Wenda Low, MD  Margaretha Seeds, MD  No diagnosis found.  Diagnosis: The primary encounter diagnosis was Mass of lower lobe of right lung. A diagnosis of Right lower lobe pulmonary nodule was also pertinent to this visit.   Right lower lobe pulmonary nodule   Interval Since Last Radiation: 1 month 8 days   Intent: Curative  Radiation Treatment Dates: 11/24/2022 through 12/01/2022 Site Technique Total Dose (Gy) Dose per Fx (Gy) Completed Fx Beam Energies  Lung, Right: Lung_R IMRT 54/54 18 3/3 6XFFF   Narrative:  The patient returns today for routine follow-up. The patient tolerated radiation therapy relatively well overall without any significant side effects or concerns other than a mild productive cough.        No significant interval since the patient was seen in consultation on 11/09/22.     ***        Allergies:  is allergic to penicillins, dupixent [dupilumab], and sulfa antibiotics.  Meds: Current Outpatient Medications  Medication Sig Dispense Refill   acetaminophen (TYLENOL) 500 MG tablet Take 500 mg by mouth as needed.     albuterol (ACCUNEB) 0.63 MG/3ML nebulizer solution Take 3 mLs (0.63 mg total) by nebulization 2 (two) times daily. 180 mL 11   albuterol (VENTOLIN HFA) 108 (90 Base) MCG/ACT inhaler TAKE 2 PUFFS BY MOUTH EVERY 6 HOURS AS NEEDED FOR WHEEZING OR SHORTNESS OF BREATH 18 each 2   budesonide-formoterol (SYMBICORT) 160-4.5 MCG/ACT inhaler Inhale 2 puffs into the lungs in the morning and at bedtime. 1 each 5   citalopram (CELEXA) 20 MG tablet TAKE 1 TABLET BY MOUTH ONCE DAILY IN THE MORNING 90 tablet 0   dextromethorphan-guaiFENesin (MUCINEX DM) 30-600 MG 12hr tablet Take 1 tablet by mouth 2 (two) times daily as needed for cough.     Multiple Vitamin (MULTI VITAMIN DAILY PO) Take  by mouth.     Multiple Vitamins-Minerals (PRESERVISION AREDS 2 PO) Take 1 capsule by mouth daily.     propranolol (INDERAL) 10 MG tablet TAKE 1 TABLET BY MOUTH ONCE DAILY AS NEEDED (Patient taking differently: Take 10 mg by mouth daily as needed (anxiety).) 30 tablet 0   Tiotropium Bromide Monohydrate (SPIRIVA RESPIMAT) 2.5 MCG/ACT AERS INHALE 2 PUFFS BY MOUTH INTO THE LUNGS DAILY 4 g 5   No current facility-administered medications for this encounter.    Physical Findings: The patient is in no acute distress. Patient is alert and oriented.  vitals were not taken for this visit. .  No significant changes. Lungs are clear to auscultation bilaterally. Heart has regular rate and rhythm. No palpable cervical, supraclavicular, or axillary adenopathy. Abdomen soft, non-tender, normal bowel sounds.   Lab Findings: Lab Results  Component Value Date   WBC 6.7 12/30/2021   HGB 13.5 12/30/2021   HCT 42.9 12/30/2021   MCV 83 12/30/2021   PLT 198 06/10/2021    Radiographic Findings: No results found.  Impression: The primary encounter diagnosis was Mass of lower lobe of right lung. A diagnosis of Right lower lobe pulmonary nodule was also pertinent to this visit.   Right lower lobe pulmonary nodule    The patient is recovering from the effects of radiation.  ***  Plan:  ***   *** minutes of total time was spent for this patient encounter, including preparation, face-to-face counseling  with the patient and coordination of care, physical exam, and documentation of the encounter. ____________________________________  Blair Promise, PhD, MD  This document serves as a record of services personally performed by Gery Pray, MD. It was created on his behalf by Roney Mans, a trained medical scribe. The creation of this record is based on the scribe's personal observations and the provider's statements to them. This document has been checked and approved by the attending provider.

## 2023-01-09 ENCOUNTER — Encounter: Payer: Self-pay | Admitting: Radiation Oncology

## 2023-01-09 ENCOUNTER — Ambulatory Visit
Admission: RE | Admit: 2023-01-09 | Discharge: 2023-01-09 | Disposition: A | Discharge status: Home / Self Care | Payer: Medicare PPO | Source: Ambulatory Visit | Attending: Radiation Oncology | Admitting: Radiation Oncology

## 2023-01-09 VITALS — BP 119/75 | HR 75 | Temp 98.8°F | Resp 20 | Ht 70.0 in | Wt 184.4 lb

## 2023-01-09 DIAGNOSIS — J449 Chronic obstructive pulmonary disease, unspecified: Secondary | ICD-10-CM | POA: Diagnosis not present

## 2023-01-09 DIAGNOSIS — R911 Solitary pulmonary nodule: Secondary | ICD-10-CM

## 2023-01-09 DIAGNOSIS — J9612 Chronic respiratory failure with hypercapnia: Secondary | ICD-10-CM | POA: Diagnosis not present

## 2023-01-09 DIAGNOSIS — Z923 Personal history of irradiation: Secondary | ICD-10-CM | POA: Insufficient documentation

## 2023-01-09 DIAGNOSIS — R918 Other nonspecific abnormal finding of lung field: Secondary | ICD-10-CM | POA: Insufficient documentation

## 2023-01-09 DIAGNOSIS — G4733 Obstructive sleep apnea (adult) (pediatric): Secondary | ICD-10-CM | POA: Diagnosis not present

## 2023-01-09 HISTORY — DX: Personal history of irradiation: Z92.3

## 2023-01-10 DIAGNOSIS — J9612 Chronic respiratory failure with hypercapnia: Secondary | ICD-10-CM | POA: Diagnosis not present

## 2023-01-10 DIAGNOSIS — J449 Chronic obstructive pulmonary disease, unspecified: Secondary | ICD-10-CM | POA: Diagnosis not present

## 2023-01-10 DIAGNOSIS — G4733 Obstructive sleep apnea (adult) (pediatric): Secondary | ICD-10-CM | POA: Diagnosis not present

## 2023-01-11 ENCOUNTER — Other Ambulatory Visit: Payer: Self-pay | Admitting: Pulmonary Disease

## 2023-01-13 DIAGNOSIS — J449 Chronic obstructive pulmonary disease, unspecified: Secondary | ICD-10-CM | POA: Diagnosis not present

## 2023-01-15 DIAGNOSIS — J449 Chronic obstructive pulmonary disease, unspecified: Secondary | ICD-10-CM | POA: Diagnosis not present

## 2023-01-26 DIAGNOSIS — H35371 Puckering of macula, right eye: Secondary | ICD-10-CM | POA: Diagnosis not present

## 2023-01-26 DIAGNOSIS — H43813 Vitreous degeneration, bilateral: Secondary | ICD-10-CM | POA: Diagnosis not present

## 2023-01-26 DIAGNOSIS — H353111 Nonexudative age-related macular degeneration, right eye, early dry stage: Secondary | ICD-10-CM | POA: Diagnosis not present

## 2023-01-26 DIAGNOSIS — H353221 Exudative age-related macular degeneration, left eye, with active choroidal neovascularization: Secondary | ICD-10-CM | POA: Diagnosis not present

## 2023-02-07 DIAGNOSIS — G4733 Obstructive sleep apnea (adult) (pediatric): Secondary | ICD-10-CM | POA: Diagnosis not present

## 2023-02-07 DIAGNOSIS — J9612 Chronic respiratory failure with hypercapnia: Secondary | ICD-10-CM | POA: Diagnosis not present

## 2023-02-07 DIAGNOSIS — J449 Chronic obstructive pulmonary disease, unspecified: Secondary | ICD-10-CM | POA: Diagnosis not present

## 2023-02-08 DIAGNOSIS — J449 Chronic obstructive pulmonary disease, unspecified: Secondary | ICD-10-CM | POA: Diagnosis not present

## 2023-02-08 DIAGNOSIS — G4733 Obstructive sleep apnea (adult) (pediatric): Secondary | ICD-10-CM | POA: Diagnosis not present

## 2023-02-08 DIAGNOSIS — J9612 Chronic respiratory failure with hypercapnia: Secondary | ICD-10-CM | POA: Diagnosis not present

## 2023-02-13 DIAGNOSIS — J449 Chronic obstructive pulmonary disease, unspecified: Secondary | ICD-10-CM | POA: Diagnosis not present

## 2023-02-15 DIAGNOSIS — J449 Chronic obstructive pulmonary disease, unspecified: Secondary | ICD-10-CM | POA: Diagnosis not present

## 2023-02-20 DIAGNOSIS — J9612 Chronic respiratory failure with hypercapnia: Secondary | ICD-10-CM | POA: Diagnosis not present

## 2023-02-20 DIAGNOSIS — J449 Chronic obstructive pulmonary disease, unspecified: Secondary | ICD-10-CM | POA: Diagnosis not present

## 2023-02-20 DIAGNOSIS — G4733 Obstructive sleep apnea (adult) (pediatric): Secondary | ICD-10-CM | POA: Diagnosis not present

## 2023-03-07 ENCOUNTER — Other Ambulatory Visit: Payer: Self-pay | Admitting: Physician Assistant

## 2023-03-09 DIAGNOSIS — H353221 Exudative age-related macular degeneration, left eye, with active choroidal neovascularization: Secondary | ICD-10-CM | POA: Diagnosis not present

## 2023-03-09 DIAGNOSIS — H43813 Vitreous degeneration, bilateral: Secondary | ICD-10-CM | POA: Diagnosis not present

## 2023-03-09 DIAGNOSIS — H353111 Nonexudative age-related macular degeneration, right eye, early dry stage: Secondary | ICD-10-CM | POA: Diagnosis not present

## 2023-03-09 DIAGNOSIS — H35371 Puckering of macula, right eye: Secondary | ICD-10-CM | POA: Diagnosis not present

## 2023-03-10 DIAGNOSIS — J9612 Chronic respiratory failure with hypercapnia: Secondary | ICD-10-CM | POA: Diagnosis not present

## 2023-03-10 DIAGNOSIS — G4733 Obstructive sleep apnea (adult) (pediatric): Secondary | ICD-10-CM | POA: Diagnosis not present

## 2023-03-10 DIAGNOSIS — J449 Chronic obstructive pulmonary disease, unspecified: Secondary | ICD-10-CM | POA: Diagnosis not present

## 2023-03-11 DIAGNOSIS — J9612 Chronic respiratory failure with hypercapnia: Secondary | ICD-10-CM | POA: Diagnosis not present

## 2023-03-11 DIAGNOSIS — J449 Chronic obstructive pulmonary disease, unspecified: Secondary | ICD-10-CM | POA: Diagnosis not present

## 2023-03-11 DIAGNOSIS — G4733 Obstructive sleep apnea (adult) (pediatric): Secondary | ICD-10-CM | POA: Diagnosis not present

## 2023-03-15 DIAGNOSIS — J449 Chronic obstructive pulmonary disease, unspecified: Secondary | ICD-10-CM | POA: Diagnosis not present

## 2023-03-17 DIAGNOSIS — J449 Chronic obstructive pulmonary disease, unspecified: Secondary | ICD-10-CM | POA: Diagnosis not present

## 2023-03-22 DIAGNOSIS — G4733 Obstructive sleep apnea (adult) (pediatric): Secondary | ICD-10-CM | POA: Diagnosis not present

## 2023-03-22 DIAGNOSIS — J9612 Chronic respiratory failure with hypercapnia: Secondary | ICD-10-CM | POA: Diagnosis not present

## 2023-03-22 DIAGNOSIS — J449 Chronic obstructive pulmonary disease, unspecified: Secondary | ICD-10-CM | POA: Diagnosis not present

## 2023-03-28 ENCOUNTER — Telehealth: Payer: Self-pay | Admitting: *Deleted

## 2023-03-28 NOTE — Telephone Encounter (Signed)
CALLED  PATIENT TO INFORM OF CT FOR 04-14-23- ARRIVAL TIME-1:15 PM @ [facility], NO RESTRICTIONS TO TEST, PATIENT TO RECEIVE RESULTS FROM DR. KINARD ON 04-17-23 @ 10:30 AM, SPOKE WITH PATIENT AND HE IS AWARE OF THESE APPTS. AND THE INSTRUCTIONS

## 2023-04-06 ENCOUNTER — Ambulatory Visit: Payer: Self-pay | Admitting: Radiation Oncology

## 2023-04-09 DIAGNOSIS — G4733 Obstructive sleep apnea (adult) (pediatric): Secondary | ICD-10-CM | POA: Diagnosis not present

## 2023-04-09 DIAGNOSIS — J9612 Chronic respiratory failure with hypercapnia: Secondary | ICD-10-CM | POA: Diagnosis not present

## 2023-04-09 DIAGNOSIS — J449 Chronic obstructive pulmonary disease, unspecified: Secondary | ICD-10-CM | POA: Diagnosis not present

## 2023-04-10 DIAGNOSIS — J9612 Chronic respiratory failure with hypercapnia: Secondary | ICD-10-CM | POA: Diagnosis not present

## 2023-04-10 DIAGNOSIS — G4733 Obstructive sleep apnea (adult) (pediatric): Secondary | ICD-10-CM | POA: Diagnosis not present

## 2023-04-10 DIAGNOSIS — J449 Chronic obstructive pulmonary disease, unspecified: Secondary | ICD-10-CM | POA: Diagnosis not present

## 2023-04-12 DIAGNOSIS — H353220 Exudative age-related macular degeneration, left eye, stage unspecified: Secondary | ICD-10-CM | POA: Diagnosis not present

## 2023-04-13 ENCOUNTER — Other Ambulatory Visit (HOSPITAL_COMMUNITY): Payer: Medicare PPO

## 2023-04-14 ENCOUNTER — Ambulatory Visit (HOSPITAL_COMMUNITY)
Admission: RE | Admit: 2023-04-14 | Discharge: 2023-04-14 | Disposition: A | Discharge status: Home / Self Care | Payer: Medicare PPO | Source: Ambulatory Visit | Attending: Radiation Oncology | Admitting: Radiation Oncology

## 2023-04-14 DIAGNOSIS — J439 Emphysema, unspecified: Secondary | ICD-10-CM | POA: Diagnosis not present

## 2023-04-14 DIAGNOSIS — R911 Solitary pulmonary nodule: Secondary | ICD-10-CM | POA: Diagnosis not present

## 2023-04-14 DIAGNOSIS — R918 Other nonspecific abnormal finding of lung field: Secondary | ICD-10-CM | POA: Diagnosis not present

## 2023-04-14 DIAGNOSIS — C3490 Malignant neoplasm of unspecified part of unspecified bronchus or lung: Secondary | ICD-10-CM | POA: Diagnosis not present

## 2023-04-14 NOTE — Progress Notes (Signed)
Radiation Oncology         [phone] ________________________________  Name: Martin Mendez MRN: [id number]  Date: 04/17/2023  DOB: 26-Jan-1946  Follow-Up Visit Note  CC: Georgann Housekeeper, MD  Luciano Cutter, MD  No diagnosis found.  Diagnosis: The primary encounter diagnosis was Mass of lower lobe of right lung. A diagnosis of Right lower lobe pulmonary nodule was also pertinent to this visit.   Right lower lobe pulmonary nodule    Interval Since Last Radiation: 4 months and 16 days   Intent: Curative  Radiation Treatment Dates: 11/24/2022 through 12/01/2022 Site Technique Total Dose (Gy) Dose per Fx (Gy) Completed Fx Beam Energies  Lung, Right: Lung_R IMRT 54/54 18 3/3 6XFFF   Narrative:  The patient returns today for routine follow-up and to review recent imaging. He was last seen here for follow-up on 01/09/23. His most recent chest CT performed on 04/14/23 demonstrates: ***.        No other significant interval history since the patient was last seen.    ***                       Allergies:  is allergic to penicillins, dupixent [dupilumab], and sulfa antibiotics.  Meds: Current Outpatient Medications  Medication Sig Dispense Refill   acetaminophen (TYLENOL) 500 MG tablet Take 500 mg by mouth as needed.     albuterol (ACCUNEB) 0.63 MG/3ML nebulizer solution Take 3 mLs (0.63 mg total) by nebulization 2 (two) times daily. 180 mL 11   albuterol (VENTOLIN HFA) 108 (90 Base) MCG/ACT inhaler TAKE 2 PUFFS BY MOUTH EVERY 6 HOURS AS NEEDED FOR WHEEZING OR SHORTNESS OF BREATH 18 each 2   budesonide-formoterol (SYMBICORT) 160-4.5 MCG/ACT inhaler Inhale 2 puffs into the lungs in the morning and at bedtime. 1 each 5   citalopram (CELEXA) 20 MG tablet TAKE 1 TABLET BY MOUTH ONCE DAILY IN THE MORNING 90 tablet 1   dextromethorphan-guaiFENesin (MUCINEX DM) 30-600 MG 12hr tablet Take 1 tablet by mouth 2 (two) times daily as needed for cough.     Multiple Vitamin (MULTI VITAMIN DAILY PO)  Take by mouth.     Multiple Vitamins-Minerals (PRESERVISION AREDS 2 PO) Take 1 capsule by mouth daily.     propranolol (INDERAL) 10 MG tablet TAKE 1 TABLET BY MOUTH ONCE DAILY AS NEEDED (Patient taking differently: Take 10 mg by mouth daily as needed (anxiety).) 30 tablet 0   Tiotropium Bromide Monohydrate (SPIRIVA RESPIMAT) 2.5 MCG/ACT AERS INHALE 2 PUFFS BY MOUTH INTO THE LUNGS DAILY 4 g 5   No current facility-administered medications for this encounter.    Physical Findings: The patient is in no acute distress. Patient is alert and oriented.  vitals were not taken for this visit. .  No significant changes. Lungs are clear to auscultation bilaterally. Heart has regular rate and rhythm. No palpable cervical, supraclavicular, or axillary adenopathy. Abdomen soft, non-tender, normal bowel sounds.   Lab Findings: Lab Results  Component Value Date   WBC 6.7 12/30/2021   HGB 13.5 12/30/2021   HCT 42.9 12/30/2021   MCV 83 12/30/2021   PLT 198 06/10/2021    Radiographic Findings: No results found.  Impression:  The primary encounter diagnosis was Mass of lower lobe of right lung. A diagnosis of Right lower lobe pulmonary nodule was also pertinent to this visit.   Right lower lobe pulmonary nodule     The patient is recovering from the effects of radiation.  ***  Plan:  ***   *** minutes of total time was spent for this patient encounter, including preparation, face-to-face counseling with the patient and coordination of care, physical exam, and documentation of the encounter. ____________________________________  Billie Lade, PhD, MD  This document serves as a record of services personally performed by Antony Blackbird, MD. It was created on his behalf by Neena Rhymes, a trained medical scribe. The creation of this record is based on the scribe's personal observations and the provider's statements to them. This document has been checked and approved by the attending provider.

## 2023-04-15 DIAGNOSIS — J449 Chronic obstructive pulmonary disease, unspecified: Secondary | ICD-10-CM | POA: Diagnosis not present

## 2023-04-17 ENCOUNTER — Ambulatory Visit
Admission: RE | Admit: 2023-04-17 | Discharge: 2023-04-17 | Disposition: A | Discharge status: Home / Self Care | Payer: Medicare PPO | Source: Ambulatory Visit | Attending: Radiation Oncology | Admitting: Radiation Oncology

## 2023-04-17 ENCOUNTER — Encounter: Payer: Self-pay | Admitting: Radiation Oncology

## 2023-04-17 ENCOUNTER — Other Ambulatory Visit: Payer: Self-pay

## 2023-04-17 VITALS — BP 108/68 | HR 71 | Temp 97.6°F | Resp 20 | Ht 70.0 in | Wt 184.6 lb

## 2023-04-17 DIAGNOSIS — Z8719 Personal history of other diseases of the digestive system: Secondary | ICD-10-CM | POA: Diagnosis not present

## 2023-04-17 DIAGNOSIS — Z79899 Other long term (current) drug therapy: Secondary | ICD-10-CM | POA: Insufficient documentation

## 2023-04-17 DIAGNOSIS — I700 Atherosclerosis of aorta: Secondary | ICD-10-CM | POA: Diagnosis not present

## 2023-04-17 DIAGNOSIS — J439 Emphysema, unspecified: Secondary | ICD-10-CM | POA: Insufficient documentation

## 2023-04-17 DIAGNOSIS — Z923 Personal history of irradiation: Secondary | ICD-10-CM | POA: Insufficient documentation

## 2023-04-17 DIAGNOSIS — R918 Other nonspecific abnormal finding of lung field: Secondary | ICD-10-CM | POA: Diagnosis not present

## 2023-04-17 DIAGNOSIS — I2510 Atherosclerotic heart disease of native coronary artery without angina pectoris: Secondary | ICD-10-CM | POA: Insufficient documentation

## 2023-04-17 DIAGNOSIS — Z7951 Long term (current) use of inhaled steroids: Secondary | ICD-10-CM | POA: Diagnosis not present

## 2023-04-17 DIAGNOSIS — R911 Solitary pulmonary nodule: Secondary | ICD-10-CM | POA: Diagnosis not present

## 2023-04-17 DIAGNOSIS — J449 Chronic obstructive pulmonary disease, unspecified: Secondary | ICD-10-CM | POA: Diagnosis not present

## 2023-04-17 DIAGNOSIS — J9612 Chronic respiratory failure with hypercapnia: Secondary | ICD-10-CM | POA: Diagnosis not present

## 2023-04-17 DIAGNOSIS — Z8673 Personal history of transient ischemic attack (TIA), and cerebral infarction without residual deficits: Secondary | ICD-10-CM | POA: Diagnosis not present

## 2023-04-17 DIAGNOSIS — F4310 Post-traumatic stress disorder, unspecified: Secondary | ICD-10-CM | POA: Diagnosis not present

## 2023-04-17 NOTE — Progress Notes (Signed)
Erich Montane is here today for follow up post radiation to the lung.  Lung Side: Right, patient completed treatment on 12/01/22.  Does the patient complain of any of the following: Pain: No Shortness of breath w/wo exertion: Yes, mostly on exertion. Patient also has COPD.  Cough: Yes, productive Hemoptysis: No Pain with swallowing: No Swallowing/choking concerns: No Appetite: Good Weight:  Wt Readings from Last 3 Encounters:  04/17/23 184 lb 9.6 oz (83.7 kg)  01/09/23 184 lb 6.4 oz (83.6 kg)  11/09/22 182 lb 12.8 oz (82.9 kg)   Energy Level: Fair  Post radiation skin Changes: No    Additional comments if applicable:   BP 108/68 (BP Location: Right Arm, Patient Position: Sitting, Cuff Size: Large)   Pulse 71   Temp 97.6 F (36.4 C)   Resp 20   Ht 5\' 10"  (1.778 m)   Wt 184 lb 9.6 oz (83.7 kg)   SpO2 96%   BMI 26.49 kg/m

## 2023-04-20 DIAGNOSIS — H353111 Nonexudative age-related macular degeneration, right eye, early dry stage: Secondary | ICD-10-CM | POA: Diagnosis not present

## 2023-04-20 DIAGNOSIS — H35371 Puckering of macula, right eye: Secondary | ICD-10-CM | POA: Diagnosis not present

## 2023-04-20 DIAGNOSIS — H43813 Vitreous degeneration, bilateral: Secondary | ICD-10-CM | POA: Diagnosis not present

## 2023-04-20 DIAGNOSIS — H353221 Exudative age-related macular degeneration, left eye, with active choroidal neovascularization: Secondary | ICD-10-CM | POA: Diagnosis not present

## 2023-04-22 DIAGNOSIS — J449 Chronic obstructive pulmonary disease, unspecified: Secondary | ICD-10-CM | POA: Diagnosis not present

## 2023-04-22 DIAGNOSIS — J9612 Chronic respiratory failure with hypercapnia: Secondary | ICD-10-CM | POA: Diagnosis not present

## 2023-04-22 DIAGNOSIS — G4733 Obstructive sleep apnea (adult) (pediatric): Secondary | ICD-10-CM | POA: Diagnosis not present

## 2023-05-10 DIAGNOSIS — J9612 Chronic respiratory failure with hypercapnia: Secondary | ICD-10-CM | POA: Diagnosis not present

## 2023-05-10 DIAGNOSIS — J449 Chronic obstructive pulmonary disease, unspecified: Secondary | ICD-10-CM | POA: Diagnosis not present

## 2023-05-10 DIAGNOSIS — G4733 Obstructive sleep apnea (adult) (pediatric): Secondary | ICD-10-CM | POA: Diagnosis not present

## 2023-05-11 DIAGNOSIS — G4733 Obstructive sleep apnea (adult) (pediatric): Secondary | ICD-10-CM | POA: Diagnosis not present

## 2023-05-11 DIAGNOSIS — J9612 Chronic respiratory failure with hypercapnia: Secondary | ICD-10-CM | POA: Diagnosis not present

## 2023-05-11 DIAGNOSIS — J449 Chronic obstructive pulmonary disease, unspecified: Secondary | ICD-10-CM | POA: Diagnosis not present

## 2023-05-15 DIAGNOSIS — J449 Chronic obstructive pulmonary disease, unspecified: Secondary | ICD-10-CM | POA: Diagnosis not present

## 2023-05-17 DIAGNOSIS — J449 Chronic obstructive pulmonary disease, unspecified: Secondary | ICD-10-CM | POA: Diagnosis not present

## 2023-05-23 DIAGNOSIS — G4733 Obstructive sleep apnea (adult) (pediatric): Secondary | ICD-10-CM | POA: Diagnosis not present

## 2023-05-30 ENCOUNTER — Ambulatory Visit (HOSPITAL_BASED_OUTPATIENT_CLINIC_OR_DEPARTMENT_OTHER): Payer: Medicare PPO | Admitting: Pulmonary Disease

## 2023-05-30 ENCOUNTER — Encounter (HOSPITAL_BASED_OUTPATIENT_CLINIC_OR_DEPARTMENT_OTHER): Payer: Self-pay | Admitting: Pulmonary Disease

## 2023-05-30 VITALS — BP 108/68 | HR 80 | Temp 98.5°F | Ht 70.0 in | Wt 184.6 lb

## 2023-05-30 DIAGNOSIS — J449 Chronic obstructive pulmonary disease, unspecified: Secondary | ICD-10-CM | POA: Diagnosis not present

## 2023-05-30 DIAGNOSIS — R911 Solitary pulmonary nodule: Secondary | ICD-10-CM

## 2023-05-30 DIAGNOSIS — J9612 Chronic respiratory failure with hypercapnia: Secondary | ICD-10-CM | POA: Diagnosis not present

## 2023-05-30 MED ORDER — BUDESONIDE-FORMOTEROL FUMARATE 160-4.5 MCG/ACT IN AERO
2.0000 | INHALATION_SPRAY | Freq: Two times a day (BID) | RESPIRATORY_TRACT | 5 refills | Status: DC
Start: 2023-05-30 — End: 2023-12-05

## 2023-05-30 MED ORDER — SPIRIVA RESPIMAT 2.5 MCG/ACT IN AERS
INHALATION_SPRAY | RESPIRATORY_TRACT | 5 refills | Status: DC
Start: 2023-05-30 — End: 2023-12-05

## 2023-05-30 NOTE — Patient Instructions (Addendum)
  COPD, unspecified Hoarseness --CONTINUE Spiriva TWO puffs ONCE daily --CONTINUE Symbicort TWO puffs TWICE a day. 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 Minimal leakage --CONTINUE BiPAP  Right lower lobe nodule - improving --CT Chest in 10/2023 with Radiation Oncology

## 2023-05-30 NOTE — Progress Notes (Unsigned)
Subjective:   PATIENT ID: Martin Mendez GENDER: male DOB: April 29, 1946, MRN: [id number]   HPI  Chief Complaint  Patient presents with   Follow-up    Breathing is some better since the last visit. He uses his albuterol inhaler and neb both 1-2 x per day. He has occ cough with thick, white to yellow sputum.     Reason for Visit: Follow-up  [name] is a 77 year old male former smoker with emphysema and PTSD who presents for follow-up  Synopsis: He was admitted from 06/05/2021 to 06/09/2021 for hematemesis.  EGD on 06/05/2021 demonstrated gastritis, duodenitis and nonbleeding duodenal ulcer.  His hospital course was complicated by acute respiratory failure with hypercapnia.  He was treated with noninvasive ventilation with improvement.  Pulmonary was consulted while inpatient and recommended noninvasive ventilation for outpatient management. COPD well-controlled with Symbicort, Spiriva and BiPAP  12/49/23 Wife present and provides additional support. Since our last visit we have discussed his recent CT and PET imaging concerning for possible slow growing RLL lung lesion concerning for primary lung cancer. He is scheduled with radiation oncology for further discussion and likely plan for radiation if offered. His overall lung health is fair and he has been compliant with BiPAP which improves his breathing. Compliant with bronchodilators. No exacerbations since 05/2021. He reports he is being treated with ARED2 for AMD, which is less likely to contribute to risk of lung cancer.  05/30/23 Wife present and provides additional history. Has completed radiation with improved RLL nodule size on May CT. He is compliant with BiPAP nightly and occasionally wife reports some leaking at night. He reports occasional cough every 3-4 days and once he produces sputum he is fine. Compliant with his inhalers. Uses nebulizer for these episodes. Will have hoarseness by the end of the day. Using BiPAP humidifier.    Prior inhalers: Incruse - prefers Spiriva  Social History: Former smoker  Past Medical History:  Diagnosis Date   COPD (chronic obstructive pulmonary disease) (HCC)    History of radiation therapy    Right Lung- 11/24/22-12/01/22- Dr. Antony Blackbird   PTSD (post-traumatic stress disorder)     Outpatient Medications Prior to Visit  Medication Sig Dispense Refill   acetaminophen (TYLENOL) 500 MG tablet Take 500 mg by mouth as needed.     albuterol (ACCUNEB) 0.63 MG/3ML nebulizer solution Take 3 mLs (0.63 mg total) by nebulization 2 (two) times daily. 180 mL 11   albuterol (VENTOLIN HFA) 108 (90 Base) MCG/ACT inhaler TAKE 2 PUFFS BY MOUTH EVERY 6 HOURS AS NEEDED FOR WHEEZING OR SHORTNESS OF BREATH 18 each 2   citalopram (CELEXA) 20 MG tablet TAKE 1 TABLET BY MOUTH ONCE DAILY IN THE MORNING 90 tablet 1   dextromethorphan-guaiFENesin (MUCINEX DM) 30-600 MG 12hr tablet Take 1 tablet by mouth 2 (two) times daily as needed for cough.     faricimab-svoa (VABYSMO) 6 MG/0.05ML SOLN intravitreal injection 6 mg by Intravitreal route once.     Multiple Vitamin (MULTI VITAMIN DAILY PO) Take by mouth.     Multiple Vitamins-Minerals (PRESERVISION AREDS 2 PO) Take 1 capsule by mouth daily.     propranolol (INDERAL) 10 MG tablet TAKE 1 TABLET BY MOUTH ONCE DAILY AS NEEDED (Patient taking differently: Take 10 mg by mouth daily as needed (anxiety).) 30 tablet 0   budesonide-formoterol (SYMBICORT) 160-4.5 MCG/ACT inhaler Inhale 2 puffs into the lungs in the morning and at bedtime. 1 each 5   Tiotropium Bromide Monohydrate (SPIRIVA RESPIMAT) 2.5 MCG/ACT  AERS INHALE 2 PUFFS BY MOUTH INTO THE LUNGS DAILY 4 g 5   No facility-administered medications prior to visit.    Review of Systems  Constitutional:  Negative for chills, diaphoresis, fever, malaise/fatigue and weight loss.  HENT:  Negative for congestion.        Hoarsenss  Respiratory:  Positive for cough. Negative for hemoptysis, sputum production,  shortness of breath and wheezing.   Cardiovascular:  Negative for chest pain, palpitations and leg swelling.     Objective:   Vitals:   05/30/23 1359  BP: 108/68  Pulse: 80  Temp: 98.5 F (36.9 C)  TempSrc: Oral  SpO2: 95%  Weight: 184 lb 9.6 oz (83.7 kg)  Height: 5\' 10"  (1.778 m)   SpO2: 95 % (on RA) O2 Device: None (Room air)  Physical Exam: General: Well-appearing, no acute distress HENT: [place], AT Eyes: EOMI, no scleral icterus Respiratory: Diminished but clear to auscultation bilaterally.  No crackles, wheezing or rales Cardiovascular: RRR, -M/R/G, no JVD Extremities:-Edema,-tenderness Neuro: AAO x4, CNII-XII grossly intact Psych: Normal mood, normal affect  Data Reviewed:  Imaging: CTA 10/10/2019-moderate centrilobular emphysema.  Right upper lobe 3 mm nodular density, right lower lobe 3 mm nodule.  Left upper lobe subpleural 2 and 3 mm nodule.  Left lower lobe 3 mm nodule.  No aneurysm or dissection.  No acute findings in chest abdomen or pelvis CT Chest Lung Cancer 07/06/22 - Aggressive appearing pulmonary nodule measuring 1.14 cm in superior segment of right lower lobe concerning for neoplasm PET/CT 07/25/22 - 11mm RLL nodule with minimal abnormal FDG avidity CT Chest 10/24/22 - 13 mm. Severe emphysema. Irregular solid RLL nodule 13.3 mm, increased from 11/4 mm. Stable RUL 5mm nodule CT Chest 04/14/23 - Severe emphysema. Slightly diminished size of RLL nodule from 1.4 x 0.9 > 1.2 x 0.6 cm. Unchanged 0.5 cm RUL nodule  PFT: Spirometry 04/19/22 FVC 1.38 (33%) FEV1 0.72 (24%) Ratio 52   Interpretation: Very severe obstructive defect  Labs: CBC    Component Value Date/Time   WBC 6.7 12/30/2021 1454   WBC 7.6 06/10/2021 0207   RBC 5.20 12/30/2021 1454   RBC 3.27 (L) 06/10/2021 0207   HGB 13.5 12/30/2021 1454   HCT 42.9 12/30/2021 1454   PLT 198 06/10/2021 0207   MCV 83 12/30/2021 1454   MCH 26.0 (L) 12/30/2021 1454   MCH 30.6 06/10/2021 0207   MCHC 31.5  12/30/2021 1454   MCHC 33.3 06/10/2021 0207   RDW 15.4 12/30/2021 1454   LYMPHSABS 1.5 12/30/2021 1454   MONOABS 0.6 06/05/2021 0321   EOSABS 0.4 12/30/2021 1454   BASOSABS 0.1 12/30/2021 1454   Absolute eosinophils  06/05/2021-300 12/30/2021 - 400  ABG    Component Value Date/Time   PHART 7.325 (L) 06/08/2021 2200   PCO2ART 81.4 (HH) 06/08/2021 2200   PO2ART 105 06/08/2021 2200   HCO3 37.8 (H) 06/10/2021 1654   O2SAT 88.0 06/10/2021 1654   Interpretation: Acute on chronic hypercarbic respiratory failure  CPAP Compliance 05/01/23-05/30/23 Usage 29/30 days (97%) >4 hours 29 days (97%) Avg hours used 6 hours 3 min BiPAP 10/5 AHI 0.9 Leaks Max 7.1  Assessment & Plan:   Discussion: 77 year old male with RLL pulmonary nodule, severe emphysema, chronic hypercarbic respiratory failure on BiPAP, hx CVA who presents follow-up. Reviewed CT with improved RLL nodule size. Stable respiratory symptoms on triple therapy and CPAP. Discussed clinical course and management of COPD including bronchodilator regimen, preventive care including vaccinations and action plan for  exacerbation.  COPD, unspecified - controlled Hoarseness - secondary ICS vs NIV. If worsens consider stepping down to LAMA/LABA --CONTINUE Spiriva TWO puffs ONCE daily --CONTINUE Symbicort TWO puffs TWICE a day. 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 Minimal leakage --CONTINUE BiPAP  Right lower lobe nodule - improving --CT Chest in 10/2023 with Radiation Oncology  Tobacco cessation --Quit smoking July 2022  Health Maintenance Immunization History  Administered Date(s) Administered   Fluad Quad(high Dose 65+) 10/11/2021, 08/04/2022   Influenza-Unspecified 09/14/2008   PFIZER Comirnaty(Gray  Top)Covid-19 Tri-Sucrose Vaccine 06/06/2021   Zoster Recombinant(Shingrix) 10/17/2022   CT Lung Screen- Refer to program  No orders of the defined types were placed in this encounter.  Meds ordered this encounter  Medications   budesonide-formoterol (SYMBICORT) 160-4.5 MCG/ACT inhaler    Sig: Inhale 2 puffs into the lungs in the morning and at bedtime.    Dispense:  1 each    Refill:  5   Tiotropium Bromide Monohydrate (SPIRIVA RESPIMAT) 2.5 MCG/ACT AERS    Sig: INHALE 2 PUFFS BY MOUTH INTO THE LUNGS DAILY    Dispense:  4 g    Refill:  5   Return in about 6 months (around 11/30/2023).   I have spent a total time of 33-minutes on the day of the appointment including chart review, data review, collecting history, coordinating care and discussing medical diagnosis and plan with the patient/family. Past medical history, allergies, medications were reviewed. Pertinent imaging, labs and tests included in this note have been reviewed and interpreted independently by me.  Faline Langer Mechele Collin, MD [facility] Office Number [phone]

## 2023-06-09 DIAGNOSIS — J449 Chronic obstructive pulmonary disease, unspecified: Secondary | ICD-10-CM | POA: Diagnosis not present

## 2023-06-09 DIAGNOSIS — H353111 Nonexudative age-related macular degeneration, right eye, early dry stage: Secondary | ICD-10-CM | POA: Diagnosis not present

## 2023-06-09 DIAGNOSIS — H43813 Vitreous degeneration, bilateral: Secondary | ICD-10-CM | POA: Diagnosis not present

## 2023-06-09 DIAGNOSIS — G4733 Obstructive sleep apnea (adult) (pediatric): Secondary | ICD-10-CM | POA: Diagnosis not present

## 2023-06-09 DIAGNOSIS — J9612 Chronic respiratory failure with hypercapnia: Secondary | ICD-10-CM | POA: Diagnosis not present

## 2023-06-09 DIAGNOSIS — H35371 Puckering of macula, right eye: Secondary | ICD-10-CM | POA: Diagnosis not present

## 2023-06-09 DIAGNOSIS — H353221 Exudative age-related macular degeneration, left eye, with active choroidal neovascularization: Secondary | ICD-10-CM | POA: Diagnosis not present

## 2023-06-10 DIAGNOSIS — G4733 Obstructive sleep apnea (adult) (pediatric): Secondary | ICD-10-CM | POA: Diagnosis not present

## 2023-06-10 DIAGNOSIS — J449 Chronic obstructive pulmonary disease, unspecified: Secondary | ICD-10-CM | POA: Diagnosis not present

## 2023-06-10 DIAGNOSIS — J9612 Chronic respiratory failure with hypercapnia: Secondary | ICD-10-CM | POA: Diagnosis not present

## 2023-06-15 DIAGNOSIS — J449 Chronic obstructive pulmonary disease, unspecified: Secondary | ICD-10-CM | POA: Diagnosis not present

## 2023-06-17 DIAGNOSIS — J449 Chronic obstructive pulmonary disease, unspecified: Secondary | ICD-10-CM | POA: Diagnosis not present

## 2023-06-18 ENCOUNTER — Other Ambulatory Visit: Payer: Self-pay | Admitting: Pulmonary Disease

## 2023-06-23 DIAGNOSIS — G4733 Obstructive sleep apnea (adult) (pediatric): Secondary | ICD-10-CM | POA: Diagnosis not present

## 2023-07-10 DIAGNOSIS — J9612 Chronic respiratory failure with hypercapnia: Secondary | ICD-10-CM | POA: Diagnosis not present

## 2023-07-10 DIAGNOSIS — G4733 Obstructive sleep apnea (adult) (pediatric): Secondary | ICD-10-CM | POA: Diagnosis not present

## 2023-07-10 DIAGNOSIS — J449 Chronic obstructive pulmonary disease, unspecified: Secondary | ICD-10-CM | POA: Diagnosis not present

## 2023-07-11 DIAGNOSIS — J449 Chronic obstructive pulmonary disease, unspecified: Secondary | ICD-10-CM | POA: Diagnosis not present

## 2023-07-11 DIAGNOSIS — J9612 Chronic respiratory failure with hypercapnia: Secondary | ICD-10-CM | POA: Diagnosis not present

## 2023-07-11 DIAGNOSIS — G4733 Obstructive sleep apnea (adult) (pediatric): Secondary | ICD-10-CM | POA: Diagnosis not present

## 2023-07-16 DIAGNOSIS — J449 Chronic obstructive pulmonary disease, unspecified: Secondary | ICD-10-CM | POA: Diagnosis not present

## 2023-07-18 DIAGNOSIS — J449 Chronic obstructive pulmonary disease, unspecified: Secondary | ICD-10-CM | POA: Diagnosis not present

## 2023-07-24 DIAGNOSIS — G4733 Obstructive sleep apnea (adult) (pediatric): Secondary | ICD-10-CM | POA: Diagnosis not present

## 2023-08-10 DIAGNOSIS — J9612 Chronic respiratory failure with hypercapnia: Secondary | ICD-10-CM | POA: Diagnosis not present

## 2023-08-10 DIAGNOSIS — G4733 Obstructive sleep apnea (adult) (pediatric): Secondary | ICD-10-CM | POA: Diagnosis not present

## 2023-08-10 DIAGNOSIS — J449 Chronic obstructive pulmonary disease, unspecified: Secondary | ICD-10-CM | POA: Diagnosis not present

## 2023-08-11 DIAGNOSIS — J9612 Chronic respiratory failure with hypercapnia: Secondary | ICD-10-CM | POA: Diagnosis not present

## 2023-08-11 DIAGNOSIS — J449 Chronic obstructive pulmonary disease, unspecified: Secondary | ICD-10-CM | POA: Diagnosis not present

## 2023-08-11 DIAGNOSIS — G4733 Obstructive sleep apnea (adult) (pediatric): Secondary | ICD-10-CM | POA: Diagnosis not present

## 2023-08-15 DIAGNOSIS — J449 Chronic obstructive pulmonary disease, unspecified: Secondary | ICD-10-CM | POA: Diagnosis not present

## 2023-08-16 ENCOUNTER — Ambulatory Visit: Payer: Medicare PPO | Admitting: Physician Assistant

## 2023-08-16 NOTE — Progress Notes (Signed)
No show

## 2023-08-17 ENCOUNTER — Ambulatory Visit: Payer: Medicare PPO | Admitting: Physician Assistant

## 2023-08-17 DIAGNOSIS — H353111 Nonexudative age-related macular degeneration, right eye, early dry stage: Secondary | ICD-10-CM | POA: Diagnosis not present

## 2023-08-17 DIAGNOSIS — J449 Chronic obstructive pulmonary disease, unspecified: Secondary | ICD-10-CM | POA: Diagnosis not present

## 2023-08-17 DIAGNOSIS — H43813 Vitreous degeneration, bilateral: Secondary | ICD-10-CM | POA: Diagnosis not present

## 2023-08-17 DIAGNOSIS — H35371 Puckering of macula, right eye: Secondary | ICD-10-CM | POA: Diagnosis not present

## 2023-08-17 DIAGNOSIS — H353221 Exudative age-related macular degeneration, left eye, with active choroidal neovascularization: Secondary | ICD-10-CM | POA: Diagnosis not present

## 2023-08-18 ENCOUNTER — Other Ambulatory Visit: Payer: Self-pay | Admitting: Pulmonary Disease

## 2023-08-18 ENCOUNTER — Ambulatory Visit (INDEPENDENT_AMBULATORY_CARE_PROVIDER_SITE_OTHER): Payer: Medicare PPO | Admitting: Physician Assistant

## 2023-08-18 ENCOUNTER — Encounter: Payer: Self-pay | Admitting: Physician Assistant

## 2023-08-18 DIAGNOSIS — F411 Generalized anxiety disorder: Secondary | ICD-10-CM

## 2023-08-18 DIAGNOSIS — F3342 Major depressive disorder, recurrent, in full remission: Secondary | ICD-10-CM | POA: Diagnosis not present

## 2023-08-18 MED ORDER — CITALOPRAM HYDROBROMIDE 20 MG PO TABS
20.0000 mg | ORAL_TABLET | Freq: Every morning | ORAL | 3 refills | Status: DC
Start: 2023-08-18 — End: 2024-08-20

## 2023-08-18 MED ORDER — PROPRANOLOL HCL 10 MG PO TABS
10.0000 mg | ORAL_TABLET | Freq: Every day | ORAL | 0 refills | Status: DC | PRN
Start: 2023-08-18 — End: 2024-08-20

## 2023-08-18 NOTE — Progress Notes (Signed)
[facility]  Patient ID: Martin Mendez,  MRN: [id number]  PCP: Martin Housekeeper, MD  Date of Evaluation: 08/18/2023 Time spent: 26 minutes  Chief Complaint:  Chief Complaint   Depression; Anxiety; Follow-up   Virtual Visit via Telehealth  I connected with patient by telephone, with their informed consent, and verified patient privacy and that I am speaking with the correct person using two identifiers.  I am private, in my office and the patient is at home.  I discussed the limitations, risks, security and privacy concerns of performing an evaluation and management service by telephone and the availability of in person appointments. I also discussed with the patient that there may be a patient responsible charge related to this service. The patient expressed understanding and agreed to proceed.   I discussed the assessment and treatment plan with the patient. The patient was provided an opportunity to ask questions and all were answered. The patient agreed with the plan and demonstrated an understanding of the instructions.   The patient was advised to call back or seek an in-person evaluation if the symptoms worsen or if the condition fails to improve as anticipated.  I provided 26 minutes of non-face-to-face time during this encounter.  HISTORY/CURRENT STATUS: For routine med check.  States he's doing well. Patient is able to enjoy things.  Energy and motivation are good.   No extreme sadness, tearfulness, or feelings of hopelessness.  Sleeps well most of the time. ADLs and personal hygiene are normal.   Denies any changes in concentration, making decisions, or remembering things.  Appetite has not changed.  Weight is stable.  Denies suicidal or homicidal thoughts.  Martin Mendez gets panicky, will take a propranolol helps. He'll take one before he goes out especially if he knows he will be around a lot of people.  He rarely takes it, in fact 30 pills has lasted him for 2 years.   He does need a new prescription.  Patient denies increased energy with decreased need for sleep, increased talkativeness, racing thoughts, impulsivity or risky behaviors, increased spending, increased libido, grandiosity, increased irritability or anger, paranoia, or hallucinations.  Denies dizziness, syncope, seizures, numbness, tingling, tremor, tics, unsteady gait, slurred speech, confusion. Denies muscle or joint pain, stiffness, or dystonia.  Individual Medical History/ Review of Systems: Changes? :No     Macular degeneration, getting intravitreal injections and is responding well.  Past medications for mental health diagnoses include: unknown  Allergies: Penicillins, Dupixent [dupilumab], and Sulfa antibiotics  Current Medications:  Current Outpatient Medications:    acetaminophen (TYLENOL) 500 MG tablet, Take 500 mg by mouth as needed., Disp: , Rfl:    albuterol (ACCUNEB) 0.63 MG/3ML nebulizer solution, Take 3 mLs (0.63 mg total) by nebulization 2 (two) times daily., Disp: 180 mL, Rfl: 11   albuterol (VENTOLIN HFA) 108 (90 Base) MCG/ACT inhaler, TAKE 2 PUFFS BY MOUTH EVERY 6 HOURS AS NEEDED FOR WHEEZING OR SHORTNESS OF BREATH, Disp: 18 each, Rfl: 2   budesonide-formoterol (SYMBICORT) 160-4.5 MCG/ACT inhaler, Inhale 2 puffs into the lungs in the morning and at bedtime., Disp: 1 each, Rfl: 5   faricimab-svoa (VABYSMO) 6 MG/0.05ML SOLN intravitreal injection, 6 mg by Intravitreal route once., Disp: , Rfl:    Multiple Vitamin (MULTI VITAMIN DAILY PO), Take by mouth., Disp: , Rfl:    Multiple Vitamins-Minerals (PRESERVISION AREDS 2 PO), Take 1 capsule by mouth daily., Disp: , Rfl:    Tiotropium Bromide Monohydrate (SPIRIVA RESPIMAT) 2.5 MCG/ACT AERS, INHALE 2 PUFFS BY MOUTH  INTO THE LUNGS DAILY, Disp: 4 g, Rfl: 5   citalopram (CELEXA) 20 MG tablet, Take 1 tablet (20 mg total) by mouth every morning., Disp: 90 tablet, Rfl: 3   dextromethorphan-guaiFENesin (MUCINEX DM) 30-600 MG 12hr tablet,  Take 1 tablet by mouth 2 (two) times daily as needed for cough. (Patient not taking: Reported on 08/18/2023), Disp: , Rfl:    propranolol (INDERAL) 10 MG tablet, Take 1 tablet (10 mg total) by mouth daily as needed., Disp: 30 tablet, Rfl: 0 Medication Side Effects: none  Family Medical/ Social History: Changes?  None  MENTAL HEALTH EXAM:  There were no vitals taken for this visit.There is no height or weight on file to calculate BMI.  General Appearance:  unable to assess  Eye Contact:   unable to assess  Speech:  Clear and Coherent and Normal Rate  Volume:  Normal  Mood:  Euthymic  Affect:   unable to assess  Thought Process:  Goal Directed and Descriptions of Associations: Circumstantial  Orientation:  Full (Time, Place, and Person)  Thought Content: Logical   Suicidal Thoughts:  No  Homicidal Thoughts:  No  Memory:  WNL  Judgement:  Good  Insight:  Good  Psychomotor Activity:   unable to assess  Concentration:  Concentration: Good and Attention Span: Good  Recall:  Good  Fund of Knowledge: Good  Language: Good  Assets:  Communication Skills Desire for Improvement Financial Resources/Insurance Housing Transportation  ADL's:  Intact  Cognition: WNL  Prognosis:  Good   DIAGNOSES:    ICD-10-CM   1. Recurrent major depressive disorder, in full remission (HCC)  F33.42     2. Generalized anxiety disorder  F41.1      Receiving Psychotherapy: No   RECOMMENDATIONS:  PDMP reviewed.  No controlled substances. I provided 26 minutes of non-face-to-face time during this encounter, including time spent before and after the visit in records review, medical decision making, counseling pertinent to today's visit, and charting.   He is doing well so no changes need to be made.  Continue Celexa 20 mg qd. Continue Propranolol 10 mg qid prn anxiety.  He take it rarely. Return in 1 year. Must be in office.  Martin Overly, PA-C

## 2023-09-15 DIAGNOSIS — J449 Chronic obstructive pulmonary disease, unspecified: Secondary | ICD-10-CM | POA: Diagnosis not present

## 2023-09-17 DIAGNOSIS — J449 Chronic obstructive pulmonary disease, unspecified: Secondary | ICD-10-CM | POA: Diagnosis not present

## 2023-09-21 ENCOUNTER — Other Ambulatory Visit: Payer: Self-pay | Admitting: Pulmonary Disease

## 2023-09-28 DIAGNOSIS — H353220 Exudative age-related macular degeneration, left eye, stage unspecified: Secondary | ICD-10-CM | POA: Diagnosis not present

## 2023-09-28 DIAGNOSIS — R7303 Prediabetes: Secondary | ICD-10-CM | POA: Diagnosis not present

## 2023-09-28 DIAGNOSIS — J449 Chronic obstructive pulmonary disease, unspecified: Secondary | ICD-10-CM | POA: Diagnosis not present

## 2023-09-28 DIAGNOSIS — Z9981 Dependence on supplemental oxygen: Secondary | ICD-10-CM | POA: Diagnosis not present

## 2023-09-28 DIAGNOSIS — Z0000 Encounter for general adult medical examination without abnormal findings: Secondary | ICD-10-CM | POA: Diagnosis not present

## 2023-09-28 DIAGNOSIS — J9612 Chronic respiratory failure with hypercapnia: Secondary | ICD-10-CM | POA: Diagnosis not present

## 2023-09-28 DIAGNOSIS — I700 Atherosclerosis of aorta: Secondary | ICD-10-CM | POA: Diagnosis not present

## 2023-09-28 DIAGNOSIS — L509 Urticaria, unspecified: Secondary | ICD-10-CM | POA: Diagnosis not present

## 2023-09-28 DIAGNOSIS — Z8673 Personal history of transient ischemic attack (TIA), and cerebral infarction without residual deficits: Secondary | ICD-10-CM | POA: Diagnosis not present

## 2023-09-28 DIAGNOSIS — Z125 Encounter for screening for malignant neoplasm of prostate: Secondary | ICD-10-CM | POA: Diagnosis not present

## 2023-09-29 DIAGNOSIS — Z1211 Encounter for screening for malignant neoplasm of colon: Secondary | ICD-10-CM | POA: Diagnosis not present

## 2023-10-10 ENCOUNTER — Telehealth: Payer: Self-pay | Admitting: *Deleted

## 2023-10-10 NOTE — Telephone Encounter (Signed)
CALLED PATIENT TO INFORM OF CT FOR 10-17-23- ARRIVAL TIME- 5 PM @ [facility], NO RESTRICTIONS TO SCAN, PATIENT TO RECEIVE RESULTS FROM DR. KINARD ON 10-19-23 @ 11:30 AM, LVM FOR A RETURN CALL

## 2023-10-11 ENCOUNTER — Telehealth: Payer: Self-pay | Admitting: *Deleted

## 2023-10-11 NOTE — Telephone Encounter (Signed)
Called patient to inform of CT being moved to 10-24-23- arrival time- 12:45 pm @ [facility], patient to receive results from  Dr. Roselind Messier on 10-30-23 @ 11:45 am, spoke with patient and he is aware of these appts. and the instructions

## 2023-10-15 DIAGNOSIS — J449 Chronic obstructive pulmonary disease, unspecified: Secondary | ICD-10-CM | POA: Diagnosis not present

## 2023-10-17 ENCOUNTER — Ambulatory Visit (HOSPITAL_COMMUNITY): Payer: Medicare PPO

## 2023-10-17 DIAGNOSIS — J449 Chronic obstructive pulmonary disease, unspecified: Secondary | ICD-10-CM | POA: Diagnosis not present

## 2023-10-19 ENCOUNTER — Ambulatory Visit: Payer: Self-pay | Admitting: Radiation Oncology

## 2023-10-20 ENCOUNTER — Other Ambulatory Visit: Payer: Self-pay | Admitting: Pulmonary Disease

## 2023-10-24 ENCOUNTER — Encounter (HOSPITAL_COMMUNITY): Payer: Self-pay

## 2023-10-24 ENCOUNTER — Ambulatory Visit (HOSPITAL_COMMUNITY)
Admission: RE | Admit: 2023-10-24 | Discharge: 2023-10-24 | Disposition: A | Discharge status: Home / Self Care | Payer: Medicare PPO | Source: Ambulatory Visit | Attending: Radiation Oncology | Admitting: Radiation Oncology

## 2023-10-24 DIAGNOSIS — R911 Solitary pulmonary nodule: Secondary | ICD-10-CM | POA: Insufficient documentation

## 2023-10-24 DIAGNOSIS — J439 Emphysema, unspecified: Secondary | ICD-10-CM | POA: Diagnosis not present

## 2023-10-24 DIAGNOSIS — C3490 Malignant neoplasm of unspecified part of unspecified bronchus or lung: Secondary | ICD-10-CM | POA: Diagnosis not present

## 2023-10-24 DIAGNOSIS — I700 Atherosclerosis of aorta: Secondary | ICD-10-CM | POA: Diagnosis not present

## 2023-10-25 DIAGNOSIS — G4733 Obstructive sleep apnea (adult) (pediatric): Secondary | ICD-10-CM | POA: Diagnosis not present

## 2023-10-29 NOTE — Progress Notes (Signed)
Radiation Oncology         ([phone]) [phone] ________________________________  Name: Martin Mendez MRN: [id number]  Date: 10/30/2023  DOB: July 17, 1946  Follow-Up Visit Note  CC: Georgann Housekeeper, MD  Luciano Cutter, MD  No diagnosis found.  Diagnosis: The primary encounter diagnosis was Mass of lower lobe of right lung. A diagnosis of Right lower lobe pulmonary nodule was also pertinent to this visit.   Right lower lobe pulmonary nodule   Interval Since Last Radiation: Nearly 11 months   Intent: Curative  Radiation Treatment Dates: 11/24/2022 through 12/01/2022 Site Technique Total Dose (Gy) Dose per Fx (Gy) Completed Fx Beam Energies  Lung, Right: Lung_R IMRT 54/54 18 3/3 [id number]    Narrative:  The patient returns today for routine follow-up and to review recent imaging. He was last seen here for follow-up on 04/17/23.     Since his last visit, the patient followed up with Dr. Everardo All at [facility] on 05/30/23.  The patient's wife was present at that time who helped provide additional history and reported that he did have an occasional cough every 3-4 days which seemed to improve following sputum production. He was also noted to be compliant with his inhaler regimen and nightly BiPAP.    His most recent chest CT performed on 10/24/23 demonstrates: ***.  No other significant oncologic interval history since the patient was last seen for follow-up.   ***                          Allergies:  is allergic to penicillins, dupixent [dupilumab], and sulfa antibiotics.  Meds: Current Outpatient Medications  Medication Sig Dispense Refill   acetaminophen (TYLENOL) 500 MG tablet Take 500 mg by mouth as needed.     albuterol (ACCUNEB) 0.63 MG/3ML nebulizer solution TAKE 3 MLS (0.63 MG TOTAL) BY NEBULIZATION 2 (TWO) TIMES DAILY. 150 mL 11   albuterol (VENTOLIN HFA) 108 (90 Base) MCG/ACT inhaler TAKE 2 PUFFS BY MOUTH EVERY 6 HOURS AS NEEDED FOR WHEEZING OR SHORTNESS OF  BREATH 18 each 2   budesonide-formoterol (SYMBICORT) 160-4.5 MCG/ACT inhaler Inhale 2 puffs into the lungs in the morning and at bedtime. 1 each 5   citalopram (CELEXA) 20 MG tablet Take 1 tablet (20 mg total) by mouth every morning. 90 tablet 3   dextromethorphan-guaiFENesin (MUCINEX DM) 30-600 MG 12hr tablet Take 1 tablet by mouth 2 (two) times daily as needed for cough. (Patient not taking: Reported on 08/18/2023)     faricimab-svoa (VABYSMO) 6 MG/0.05ML SOLN intravitreal injection 6 mg by Intravitreal route once.     Multiple Vitamin (MULTI VITAMIN DAILY PO) Take by mouth.     Multiple Vitamins-Minerals (PRESERVISION AREDS 2 PO) Take 1 capsule by mouth daily.     propranolol (INDERAL) 10 MG tablet Take 1 tablet (10 mg total) by mouth daily as needed. 30 tablet 0   Tiotropium Bromide Monohydrate (SPIRIVA RESPIMAT) 2.5 MCG/ACT AERS INHALE 2 PUFFS BY MOUTH INTO THE LUNGS DAILY 4 g 5   No current facility-administered medications for this encounter.    Physical Findings: The patient is in no acute distress. Patient is alert and oriented.  vitals were not taken for this visit. .  No significant changes. Lungs are clear to auscultation bilaterally. Heart has regular rate and rhythm. No palpable cervical, supraclavicular, or axillary adenopathy. Abdomen soft, non-tender, normal bowel sounds.   Lab Findings: Lab Results  Component Value Date   WBC 6.7  12/30/2021   HGB 13.5 12/30/2021   HCT 42.9 12/30/2021   MCV 83 12/30/2021   PLT 198 06/10/2021    Radiographic Findings: No results found.  Impression: The primary encounter diagnosis was Mass of lower lobe of right lung. A diagnosis of Right lower lobe pulmonary nodule was also pertinent to this visit.   Right lower lobe pulmonary nodule   The patient is recovering from the effects of radiation.  ***  Plan:  ***   *** minutes of total time was spent for this patient encounter, including preparation, face-to-face counseling with the  patient and coordination of care, physical exam, and documentation of the encounter. ____________________________________  Billie Lade, PhD, MD  This document serves as a record of services personally performed by Antony Blackbird, MD. It was created on his behalf by Neena Rhymes, a trained medical scribe. The creation of this record is based on the scribe's personal observations and the provider's statements to them. This document has been checked and approved by the attending provider.

## 2023-10-30 ENCOUNTER — Ambulatory Visit
Admission: RE | Admit: 2023-10-30 | Discharge: 2023-10-30 | Disposition: A | Discharge status: Home / Self Care | Payer: Medicare PPO | Source: Ambulatory Visit | Attending: Radiation Oncology | Admitting: Radiation Oncology

## 2023-10-30 ENCOUNTER — Encounter: Payer: Self-pay | Admitting: Radiation Oncology

## 2023-10-30 VITALS — BP 134/82 | HR 71 | Temp 98.0°F | Resp 18 | Ht 70.0 in | Wt 189.0 lb

## 2023-10-30 DIAGNOSIS — Z7951 Long term (current) use of inhaled steroids: Secondary | ICD-10-CM | POA: Insufficient documentation

## 2023-10-30 DIAGNOSIS — N200 Calculus of kidney: Secondary | ICD-10-CM | POA: Insufficient documentation

## 2023-10-30 DIAGNOSIS — Z79899 Other long term (current) drug therapy: Secondary | ICD-10-CM | POA: Diagnosis not present

## 2023-10-30 DIAGNOSIS — J432 Centrilobular emphysema: Secondary | ICD-10-CM | POA: Insufficient documentation

## 2023-10-30 DIAGNOSIS — Z923 Personal history of irradiation: Secondary | ICD-10-CM | POA: Diagnosis not present

## 2023-10-30 DIAGNOSIS — I700 Atherosclerosis of aorta: Secondary | ICD-10-CM | POA: Insufficient documentation

## 2023-10-30 DIAGNOSIS — R911 Solitary pulmonary nodule: Secondary | ICD-10-CM

## 2023-10-30 DIAGNOSIS — R918 Other nonspecific abnormal finding of lung field: Secondary | ICD-10-CM | POA: Insufficient documentation

## 2023-10-30 NOTE — Progress Notes (Signed)
Martin Mendez is here today for follow up post radiation to the lung.  Lung Side: Right  Does the patient complain of any of the following: Pain:Denies pain Shortness of breath w/wo exertion: Denies Cough: Reports having COPD Hemoptysis: Denies Pain with swallowing: Denies Swallowing/choking concerns: Denies Appetite: Good Energy Level: Low Post radiation skin Changes: Denies     BP 134/82 (BP Location: Left Arm, Patient Position: Sitting)   Pulse 71   Temp 98 F (36.7 C) (Temporal)   Resp 18   Ht 5\' 10"  (1.778 m)   Wt 189 lb (85.7 kg)   SpO2 95%   BMI 27.12 kg/m

## 2023-11-09 DIAGNOSIS — H353221 Exudative age-related macular degeneration, left eye, with active choroidal neovascularization: Secondary | ICD-10-CM | POA: Diagnosis not present

## 2023-11-09 DIAGNOSIS — H353111 Nonexudative age-related macular degeneration, right eye, early dry stage: Secondary | ICD-10-CM | POA: Diagnosis not present

## 2023-11-09 DIAGNOSIS — H43813 Vitreous degeneration, bilateral: Secondary | ICD-10-CM | POA: Diagnosis not present

## 2023-11-09 DIAGNOSIS — H35371 Puckering of macula, right eye: Secondary | ICD-10-CM | POA: Diagnosis not present

## 2023-11-15 DIAGNOSIS — J449 Chronic obstructive pulmonary disease, unspecified: Secondary | ICD-10-CM | POA: Diagnosis not present

## 2023-11-17 DIAGNOSIS — J449 Chronic obstructive pulmonary disease, unspecified: Secondary | ICD-10-CM | POA: Diagnosis not present

## 2023-11-29 DIAGNOSIS — R972 Elevated prostate specific antigen [PSA]: Secondary | ICD-10-CM | POA: Diagnosis not present

## 2023-12-05 ENCOUNTER — Ambulatory Visit (HOSPITAL_BASED_OUTPATIENT_CLINIC_OR_DEPARTMENT_OTHER): Payer: Medicare PPO | Admitting: Pulmonary Disease

## 2023-12-05 ENCOUNTER — Encounter (HOSPITAL_BASED_OUTPATIENT_CLINIC_OR_DEPARTMENT_OTHER): Payer: Self-pay | Admitting: Pulmonary Disease

## 2023-12-05 VITALS — BP 134/74 | HR 78 | Temp 98.4°F | Ht 70.0 in | Wt 186.0 lb

## 2023-12-05 DIAGNOSIS — J449 Chronic obstructive pulmonary disease, unspecified: Secondary | ICD-10-CM

## 2023-12-05 MED ORDER — SPIRIVA RESPIMAT 2.5 MCG/ACT IN AERS: INHALATION_SPRAY | RESPIRATORY_TRACT | 11 refills | Status: DC

## 2023-12-05 MED ORDER — BUDESONIDE-FORMOTEROL FUMARATE 160-4.5 MCG/ACT IN AERO
2.0000 | INHALATION_SPRAY | Freq: Two times a day (BID) | RESPIRATORY_TRACT | 11 refills | Status: DC
Start: 2023-12-05 — End: 2024-02-06

## 2023-12-05 NOTE — Patient Instructions (Signed)
COPD, unspecified - controlled Hoarseness - secondary ICS vs NIV. If worsens consider stepping down to LAMA/LABA --CONTINUE Spiriva 2.5 TWO puffs ONCE daily --CONTINUE Symbicort 160-4.5 TWO puffs TWICE a day. 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 Continue surveillance --Reviewed CT chest in 10/2023 with Radiation Oncology --Next scan due 04/2024

## 2023-12-05 NOTE — Progress Notes (Signed)
Subjective:   PATIENT ID: Martin Mendez GENDER: male DOB: 02/21/1946, MRN: [id number]   HPI  Chief Complaint  Patient presents with   COPD    Reason for Visit: Follow-up  [name] is a 78 year old male former smoker with emphysema and PTSD who presents for follow-up  Synopsis: He was admitted from 06/05/2021 to 06/09/2021 for hematemesis.  EGD on 06/05/2021 demonstrated gastritis, duodenitis and nonbleeding duodenal ulcer.  His hospital course was complicated by acute respiratory failure with hypercapnia.  He was treated with noninvasive ventilation with improvement.  Pulmonary was consulted while inpatient and recommended noninvasive ventilation for outpatient management. COPD well-controlled with Symbicort, Spiriva and BiPAP  12/49/23 Wife present and provides additional support. Since our last visit we have discussed his recent CT and PET imaging concerning for possible slow growing RLL lung lesion concerning for primary lung cancer. He is scheduled with radiation oncology for further discussion and likely plan for radiation if offered. His overall lung health is fair and he has been compliant with BiPAP which improves his breathing. Compliant with bronchodilators. No exacerbations since 05/2021. He reports he is being treated with ARED2 for AMD, which is less likely to contribute to risk of lung cancer.  05/30/23 Wife present and provides additional history. Has completed radiation with improved RLL nodule size on May CT. He is compliant with BiPAP nightly and occasionally wife reports some leaking at night. He reports occasional cough every 3-4 days and once he produces sputum he is fine. Compliant with his inhalers. Uses nebulizer for these episodes. Will have hoarseness by the end of the day. Using BiPAP humidifier.   12/05/23 Since our last visit he reports stable shortness of breath, coughing with good sputum production. No wheezing. Compliant with BiPAP nightly. Compliant  with Symbicort and Spiriva. Uses 0-3 albuterol as needed. Uses nebulzer once a week to help facilitate sputum production as mucinex. Denies exacerbations requiring antibiotics or prednisone. Does have mild cold symptoms that resolve quickly. During the day oxygen level range from 92-97%  Prior inhalers: Incruse - prefers Spiriva  Social History: Former smoker  Past Medical History:  Diagnosis Date   COPD (chronic obstructive pulmonary disease) (HCC)    History of radiation therapy    Right Lung- 11/24/22-12/01/22- Dr. Antony Blackbird   PTSD (post-traumatic stress disorder)     Outpatient Medications Prior to Visit  Medication Sig Dispense Refill   acetaminophen (TYLENOL) 500 MG tablet Take 500 mg by mouth as needed.     albuterol (ACCUNEB) 0.63 MG/3ML nebulizer solution TAKE 3 MLS (0.63 MG TOTAL) BY NEBULIZATION 2 (TWO) TIMES DAILY. 150 mL 11   albuterol (VENTOLIN HFA) 108 (90 Base) MCG/ACT inhaler TAKE 2 PUFFS BY MOUTH EVERY 6 HOURS AS NEEDED FOR WHEEZING OR SHORTNESS OF BREATH 18 each 2   citalopram (CELEXA) 20 MG tablet Take 1 tablet (20 mg total) by mouth every morning. 90 tablet 3   dextromethorphan-guaiFENesin (MUCINEX DM) 30-600 MG 12hr tablet Take 1 tablet by mouth 2 (two) times daily as needed for cough.     faricimab-svoa (VABYSMO) 6 MG/0.05ML SOLN intravitreal injection 6 mg by Intravitreal route once.     Multiple Vitamin (MULTI VITAMIN DAILY PO) Take by mouth.     Multiple Vitamins-Minerals (PRESERVISION AREDS 2 PO) Take 1 capsule by mouth daily.     propranolol (INDERAL) 10 MG tablet Take 1 tablet (10 mg total) by mouth daily as needed. 30 tablet 0   budesonide-formoterol (SYMBICORT) 160-4.5 MCG/ACT inhaler Inhale  2 puffs into the lungs in the morning and at bedtime. 1 each 5   Tiotropium Bromide Monohydrate (SPIRIVA RESPIMAT) 2.5 MCG/ACT AERS INHALE 2 PUFFS BY MOUTH INTO THE LUNGS DAILY 4 g 5   No facility-administered medications prior to visit.    Review of Systems   Constitutional:  Negative for chills, diaphoresis, fever, malaise/fatigue and weight loss.  HENT:  Negative for congestion.   Respiratory:  Positive for cough and sputum production. Negative for hemoptysis, shortness of breath and wheezing.   Cardiovascular:  Negative for chest pain, palpitations and leg swelling.     Objective:   Vitals:   12/05/23 1457  BP: 134/74  Pulse: 78  SpO2: 91%  Weight: 186 lb (84.4 kg)  Height: 5\' 10"  (1.778 m)    SpO2: 91 %  Physical Exam: General: Well-appearing, no acute distress HENT: [place], AT Eyes: EOMI, no scleral icterus Respiratory: Clear to auscultation bilaterally.  No crackles, wheezing or rales Cardiovascular: RRR, -M/R/G, no JVD Extremities:-Edema,-tenderness Neuro: AAO x4, CNII-XII grossly intact Psych: Normal mood, normal affect   Data Reviewed:  Imaging: CTA 10/10/2019-moderate centrilobular emphysema.  Right upper lobe 3 mm nodular density, right lower lobe 3 mm nodule.  Left upper lobe subpleural 2 and 3 mm nodule.  Left lower lobe 3 mm nodule.  No aneurysm or dissection.  No acute findings in chest abdomen or pelvis CT Chest Lung Cancer 07/06/22 - Aggressive appearing pulmonary nodule measuring 1.14 cm in superior segment of right lower lobe concerning for neoplasm PET/CT 07/25/22 - 11mm RLL nodule with minimal abnormal FDG avidity CT Chest 10/24/22 - 13 mm. Severe emphysema. Irregular solid RLL nodule 13.3 mm, increased from 11/4 mm. Stable RUL 5mm nodule CT Chest 04/14/23 - Severe emphysema. Slightly diminished size of RLL nodule from 1.4 x 0.9 > 1.2 x 0.6 cm. Unchanged 0.5 cm RUL nodule CT Chest 10/23/24 - Decreased interval size of right lower lobe nodule. Increase in RUL nodule 5x 8 mm, mildly increased. Centrilobular emphysema.  PFT: Spirometry 04/19/22 FVC 1.38 (33%) FEV1 0.72 (24%) Ratio 52   Interpretation: Very severe obstructive defect  Labs: CBC    Component Value Date/Time   WBC 6.7 12/30/2021 1454   WBC 7.6  06/10/2021 0207   RBC 5.20 12/30/2021 1454   RBC 3.27 (L) 06/10/2021 0207   HGB 13.5 12/30/2021 1454   HCT 42.9 12/30/2021 1454   PLT 198 06/10/2021 0207   MCV 83 12/30/2021 1454   MCH 26.0 (L) 12/30/2021 1454   MCH 30.6 06/10/2021 0207   MCHC 31.5 12/30/2021 1454   MCHC 33.3 06/10/2021 0207   RDW 15.4 12/30/2021 1454   LYMPHSABS 1.5 12/30/2021 1454   MONOABS 0.6 06/05/2021 0321   EOSABS 0.4 12/30/2021 1454   BASOSABS 0.1 12/30/2021 1454   Absolute eosinophils  06/05/2021-300 12/30/2021 - 400  ABG    Component Value Date/Time   PHART 7.325 (L) 06/08/2021 2200   PCO2ART 81.4 (HH) 06/08/2021 2200   PO2ART 105 06/08/2021 2200   HCO3 37.8 (H) 06/10/2021 1654   O2SAT 88.0 06/10/2021 1654   Interpretation: Acute on chronic hypercarbic respiratory failure  CPAP Compliance 05/01/23-05/30/23 Usage 29/30 days (97%) >4 hours 29 days (97%) Avg hours used 6 hours 3 min BiPAP 10/5 AHI 0.9 Leaks Max 7.1  Assessment & Plan:   Discussion: 78 year old male with RLL and RUL pulmonary nodules, severe emphysema, chronic hypercarbic respiratory failure on BiPAP for COPD and nodule follow-up. Discussed clinical course and management of COPD including bronchodilator  regimen, preventive care, BiPAP and action plan for exacerbation.  COPD, unspecified - fairly controlled, no exacerbations Hoarseness - secondary ICS vs NIV. If worsens consider stepping down to LAMA/LABA --CONTINUE Spiriva 2.5 TWO puffs ONCE daily. REFILL --CONTINUE Symbicort 160-4.5 TWO puffs TWICE a day. Use with spacer. 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 Continue surveillance --Reviewed CT chest in 10/2023  with Radiation Oncology --Next scan due 04/2024  Tobacco cessation --Quit smoking July 2022  Health Maintenance Immunization History  Administered Date(s) Administered   Fluad Quad(high Dose 65+) 10/11/2021, 08/04/2022   Influenza-Unspecified 09/14/2008   PFIZER Comirnaty(Gray Top)Covid-19 Tri-Sucrose Vaccine 06/06/2021   Zoster Recombinant(Shingrix) 10/17/2022   CT Lung Screen- Refer to program  No orders of the defined types were placed in this encounter.  Meds ordered this encounter  Medications   budesonide-formoterol (SYMBICORT) 160-4.5 MCG/ACT inhaler    Sig: Inhale 2 puffs into the lungs in the morning and at bedtime.    Dispense:  1 each    Refill:  11   Tiotropium Bromide Monohydrate (SPIRIVA RESPIMAT) 2.5 MCG/ACT AERS    Sig: INHALE 2 PUFFS BY MOUTH INTO THE LUNGS DAILY    Dispense:  4 g    Refill:  11   Return in about 9 months (around 09/03/2024).   I have spent a total time of 30-minutes on the day of the appointment including chart review, data review, collecting history, coordinating care and discussing medical diagnosis and plan with the patient/family. Past medical history, allergies, medications were reviewed. Pertinent imaging, labs and tests included in this note have been reviewed and interpreted independently by me.  Rakiyah Esch Mechele Collin, MD [facility] Office Number [phone]

## 2023-12-16 DIAGNOSIS — J449 Chronic obstructive pulmonary disease, unspecified: Secondary | ICD-10-CM | POA: Diagnosis not present

## 2023-12-18 DIAGNOSIS — J449 Chronic obstructive pulmonary disease, unspecified: Secondary | ICD-10-CM | POA: Diagnosis not present

## 2024-01-13 DIAGNOSIS — J449 Chronic obstructive pulmonary disease, unspecified: Secondary | ICD-10-CM | POA: Diagnosis not present

## 2024-01-15 DIAGNOSIS — J449 Chronic obstructive pulmonary disease, unspecified: Secondary | ICD-10-CM | POA: Diagnosis not present

## 2024-01-21 ENCOUNTER — Other Ambulatory Visit: Payer: Self-pay | Admitting: Pulmonary Disease

## 2024-02-06 ENCOUNTER — Other Ambulatory Visit (HOSPITAL_BASED_OUTPATIENT_CLINIC_OR_DEPARTMENT_OTHER): Payer: Self-pay

## 2024-02-06 ENCOUNTER — Telehealth (HOSPITAL_BASED_OUTPATIENT_CLINIC_OR_DEPARTMENT_OTHER): Payer: Self-pay | Admitting: Pulmonary Disease

## 2024-02-06 MED ORDER — STIOLTO RESPIMAT 2.5-2.5 MCG/ACT IN AERS
2.0000 | INHALATION_SPRAY | Freq: Every day | RESPIRATORY_TRACT | 11 refills | Status: AC
Start: 2024-02-06 — End: ?

## 2024-02-06 NOTE — Telephone Encounter (Signed)
 Spoke with patient he wanted to make sure he should continue the Spiriva after he finishes the Nystatin and to let us know he was being treated for thrush. He's never had thrush before with this medication.

## 2024-02-06 NOTE — Telephone Encounter (Signed)
 [facility] Telephone Encounter  Diagnosed with thrush. On Nystatin.  Previously had issues with ICS/LABA with hoarseness. On symbicort and Spiriva. Will stop these inhalers  Start Stiolto 2 puffs daily. Less risk with thrush/hoarseness  Advised to call office for any questions or concerns.

## 2024-02-06 NOTE — Telephone Encounter (Signed)
 Patient would like to speak with clinical staff regarding his Spiriva. Patient states went to [facility] for dental work and stated he has thrush- has been rinsing when using his Spiriva and still has thrush and read where Spiriva could possibly cause his thrush. Please advise and call patient back   [phone]

## 2024-02-07 ENCOUNTER — Other Ambulatory Visit (HOSPITAL_COMMUNITY): Payer: Self-pay

## 2024-02-09 DIAGNOSIS — J9612 Chronic respiratory failure with hypercapnia: Secondary | ICD-10-CM | POA: Diagnosis not present

## 2024-02-09 DIAGNOSIS — G4733 Obstructive sleep apnea (adult) (pediatric): Secondary | ICD-10-CM | POA: Diagnosis not present

## 2024-02-09 DIAGNOSIS — J449 Chronic obstructive pulmonary disease, unspecified: Secondary | ICD-10-CM | POA: Diagnosis not present

## 2024-02-13 DIAGNOSIS — J449 Chronic obstructive pulmonary disease, unspecified: Secondary | ICD-10-CM | POA: Diagnosis not present

## 2024-02-15 DIAGNOSIS — J449 Chronic obstructive pulmonary disease, unspecified: Secondary | ICD-10-CM | POA: Diagnosis not present

## 2024-02-21 DIAGNOSIS — H2513 Age-related nuclear cataract, bilateral: Secondary | ICD-10-CM | POA: Diagnosis not present

## 2024-02-21 DIAGNOSIS — H35371 Puckering of macula, right eye: Secondary | ICD-10-CM | POA: Diagnosis not present

## 2024-02-21 DIAGNOSIS — H353221 Exudative age-related macular degeneration, left eye, with active choroidal neovascularization: Secondary | ICD-10-CM | POA: Diagnosis not present

## 2024-02-21 DIAGNOSIS — H353111 Nonexudative age-related macular degeneration, right eye, early dry stage: Secondary | ICD-10-CM | POA: Diagnosis not present

## 2024-02-21 DIAGNOSIS — H43813 Vitreous degeneration, bilateral: Secondary | ICD-10-CM | POA: Diagnosis not present

## 2024-03-06 ENCOUNTER — Ambulatory Visit: Payer: Self-pay

## 2024-03-06 ENCOUNTER — Other Ambulatory Visit (HOSPITAL_BASED_OUTPATIENT_CLINIC_OR_DEPARTMENT_OTHER): Payer: Self-pay

## 2024-03-06 ENCOUNTER — Telehealth (HOSPITAL_BASED_OUTPATIENT_CLINIC_OR_DEPARTMENT_OTHER): Payer: Self-pay

## 2024-03-06 MED ORDER — BUDESONIDE 0.5 MG/2ML IN SUSP
0.5000 mg | Freq: Two times a day (BID) | RESPIRATORY_TRACT | 5 refills | Status: DC
  Filled 2024-03-06: qty 120, 30d supply, fill #0
  Filled 2024-04-01: qty 120, 30d supply, fill #1
  Filled 2024-05-15: qty 120, 30d supply, fill #2
  Filled 2024-06-12: qty 120, 30d supply, fill #3
  Filled 2024-08-29: qty 120, 30d supply, fill #4
  Filled 2024-09-30: qty 120, 30d supply, fill #5

## 2024-03-06 NOTE — Telephone Encounter (Signed)
 Pt has an OV appt on 3/25 @ 11:00 am with Dr Washington Hacker, pt notified about this appt

## 2024-03-06 NOTE — Telephone Encounter (Signed)
 Copied from CRM [id number]. Topic: Clinical - Medication Question >> Mar 06, 2024  8:35 AM Crist Dominion wrote: Reason for CRM: Patient states he would like to speak with Dr. Washington Hacker or the provider that is covering for her as the new medication prescribed to him - Tiotropium Bromide-Olodaterol (STIOLTO RESPIMAT ) 2.5-2.5 MCG/ACT AERS - is causing him labored / difficulty breathing.  *Patient transferred to nurse triage for trouble breathing*

## 2024-03-06 NOTE — Addendum Note (Signed)
 Addended by: Quillian Brunt on: 03/06/2024 12:50 PM   Modules accepted: Orders

## 2024-03-06 NOTE — Telephone Encounter (Signed)
**Note De-identified  [place] Obfuscation** Please advise 

## 2024-03-06 NOTE — Telephone Encounter (Signed)
 He reports shortness of breath even when on Symbicort  and Spiriva . Worsened after down grading Stiolto. (Had to change due thrush).   CONTINUE Stiolto START budesonide  nebulizer in the morning and evening CONTINUE BiPAP with oxygen  nightly  Will attempt to schedule walk test this afternoon due to concern for borderline low O2 sats.

## 2024-03-06 NOTE — Telephone Encounter (Signed)
 Copied from CRM [id number]. Topic: Clinical - Red Word Triage >> Mar 06, 2024  8:33 AM Crist Dominion wrote: Red Word that prompted transfer to Nurse Triage: Trouble/ labored breathing after starting Tiotropium Bromide-Olodaterol (STIOLTO RESPIMAT ) 2.5-2.5 MCG/ACT AERS  E2C2 Pulmonary Triage - Initial Assessment Questions "Chief Complaint (e.g., cough, sob, wheezing, fever, chills, sweat or additional symptoms) *Go to specific symptom protocol after initial questions.  Medication Question  Dyspnea with Exertion since switching Stiolto Respimat   Inquiring about additional medication or oxygen  machine, moreso the Inogen machine.   Please Note: Please contact the patient at [phone].   "How long have symptoms been present?"  Ongoing  Have you tested for COVID or Flu? Note: If not, ask patient if a home test can be taken. If so, instruct patient to call back for positive results. No  MEDICINES:   "Have you used any OTC meds to help with symptoms?" No If yes, ask "What medications?"  None  "Have you used your inhalers/maintenance medication?" Yes If yes, "What medications?"  Rx'd Medications  If inhaler, ask "How many puffs and how often?" Note: Review instructions on medication in the chart.  Exactly as prescribed.   OXYGEN : "Do you wear supplemental oxygen ?" No If yes, "How many liters are you supposed to use?"  No  "Do you monitor your oxygen  levels?" Yes If yes, "What is your reading (oxygen  level) today?"  95%  "What is your usual oxygen  saturation reading?"  (Note: Pulmonary O2 sats should be 90% or greater)  92-96%   Reason for Disposition  [1] MILD longstanding difficulty breathing AND [2]  SAME as normal  Answer Assessment - Initial Assessment Questions 1. RESPIRATORY STATUS: "Describe your breathing?" (e.g., wheezing, shortness of breath, unable to speak, severe coughing)      Dyspnea with exertion  2. ONSET: "When did this breathing problem begin?"       Ongoing  3. PATTERN "Does the difficult breathing come and go, or has it been constant since it started?"      Comes and goes  4. SEVERITY: "How bad is your breathing?" (e.g., mild, moderate, severe)    - MILD: No SOB at rest, mild SOB with walking, speaks normally in sentences, can lie down, no retractions, pulse < 100.    - MODERATE: SOB at rest, SOB with minimal exertion and prefers to sit, cannot lie down flat, speaks in phrases, mild retractions, audible wheezing, pulse 100-120.    - SEVERE: Very SOB at rest, speaks in single words, struggling to breathe, sitting hunched forward, retractions, pulse > 120      Mild  5. RECURRENT SYMPTOM: "Have you had difficulty breathing before?" If Yes, ask: "When was the last time?" and "What happened that time?"      Yes, medication issue  6. CARDIAC HISTORY: "Do you have any history of heart disease?" (e.g., heart attack, angina, bypass surgery, angioplasty)      No  7. LUNG HISTORY: "Do you have any history of lung disease?"  (e.g., pulmonary embolus, asthma, emphysema)     Smoker, COPD  8. CAUSE: "What do you think is causing the breathing problem?"      New Medication  9. OTHER SYMPTOMS: "Do you have any other symptoms? (e.g., dizziness, runny nose, cough, chest pain, fever)      Cough  10. O2 SATURATION MONITOR:  "Do you use an oxygen  saturation monitor (pulse oximeter) at home?" If Yes, ask: "What is your reading (oxygen  level) today?" "What is your usual  oxygen  saturation reading?" (e.g., 95%)       95%  12. TRAVEL: "Have you traveled out of the country in the last month?" (e.g., travel history, exposures)       No  Protocols used: Breathing Difficulty-A-AH

## 2024-03-08 ENCOUNTER — Ambulatory Visit (HOSPITAL_BASED_OUTPATIENT_CLINIC_OR_DEPARTMENT_OTHER): Admitting: Pulmonary Disease

## 2024-03-08 ENCOUNTER — Encounter (HOSPITAL_BASED_OUTPATIENT_CLINIC_OR_DEPARTMENT_OTHER): Payer: Self-pay | Admitting: Pulmonary Disease

## 2024-03-08 VITALS — BP 116/60 | HR 85 | Ht 70.0 in | Wt 188.8 lb

## 2024-03-08 DIAGNOSIS — J449 Chronic obstructive pulmonary disease, unspecified: Secondary | ICD-10-CM

## 2024-03-08 DIAGNOSIS — R918 Other nonspecific abnormal finding of lung field: Secondary | ICD-10-CM | POA: Diagnosis not present

## 2024-03-08 DIAGNOSIS — J9612 Chronic respiratory failure with hypercapnia: Secondary | ICD-10-CM | POA: Diagnosis not present

## 2024-03-08 DIAGNOSIS — Z87891 Personal history of nicotine dependence: Secondary | ICD-10-CM

## 2024-03-08 NOTE — Patient Instructions (Signed)
 COPD, unspecified - fairly controlled, no exacerbations --START budesonide  nebulizer 0.5 mg/2ml in the morning and evening. Consider using albuterol  beforehand --CONTINUE Albuterol  nebulizer up to twice a day AS NEEDED for mucous clearance --CONTINUE Stiolto 2.5 TWO puffs ONCE daily.  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 

## 2024-03-08 NOTE — Progress Notes (Signed)
 Subjective:   PATIENT ID: Martin Mendez GENDER: male DOB: September 28, 1946, MRN: [id number]   HPI  Chief Complaint  Patient presents with   Follow-up    COPD    Reason for Visit: Follow-up  [name] is a 78 year old male former smoker with emphysema and PTSD who presents for follow-up  Synopsis: He was admitted from 06/05/2021 to 06/09/2021 for hematemesis.  EGD on 06/05/2021 demonstrated gastritis, duodenitis and nonbleeding duodenal ulcer.  His hospital course was complicated by acute respiratory failure with hypercapnia.  He was treated with noninvasive ventilation with improvement.  Pulmonary was consulted while inpatient and recommended noninvasive ventilation for outpatient management. COPD well-controlled with Symbicort , Spiriva  and BiPAP  12/49/23 Wife present and provides additional support. Since our last visit we have discussed his recent CT and PET imaging concerning for possible slow growing RLL lung lesion concerning for primary lung cancer. He is scheduled with radiation oncology for further discussion and likely plan for radiation if offered. His overall lung health is fair and he has been compliant with BiPAP which improves his breathing. Compliant with bronchodilators. No exacerbations since 05/2021. He reports he is being treated with ARED2 for AMD, which is less likely to contribute to risk of lung cancer.  05/30/23 Wife present and provides additional history. Has completed radiation with improved RLL nodule size on May CT. He is compliant with BiPAP nightly and occasionally wife reports some leaking at night. He reports occasional cough every 3-4 days and once he produces sputum he is fine. Compliant with his inhalers. Uses nebulizer for these episodes. Will have hoarseness by the end of the day. Using BiPAP humidifier.   12/05/23 Since our last visit he reports stable shortness of breath, coughing with good sputum production. No wheezing. Compliant with BiPAP nightly.  Compliant with Symbicort  and Spiriva . Uses 0-3 albuterol  as needed. Uses nebulzer once a week to help facilitate sputum production as mucinex . Denies exacerbations requiring antibiotics or prednisone . Does have mild cold symptoms that resolve quickly. During the day oxygen  level range from 92-97%  03/08/24 Since our last visit we have had to stop the ICS component of his inhaler due to thrush/hoarsness. Was switched to Stiolto on 02/06/24 due to shortness of breath. But this shortness of breath preceded him being on triple therapy so worried about progression of his COPD. He also reports concerns for borderline low oxygen  saturations. Has started budesonide  and reports initial chest tightness but improves after a minute.  Prior inhalers: Incruse - prefers Spiriva   Social History: Former smoker  Past Medical History:  Diagnosis Date   COPD (chronic obstructive pulmonary disease) (HCC)    History of radiation therapy    Right Lung- 11/24/22-12/01/22- Dr. Retta Caster   PTSD (post-traumatic stress disorder)     Outpatient Medications Prior to Visit  Medication Sig Dispense Refill   acetaminophen  (TYLENOL ) 500 MG tablet Take 500 mg by mouth as needed.     albuterol  (ACCUNEB ) 0.63 MG/3ML nebulizer solution TAKE 3 MLS (0.63 MG TOTAL) BY NEBULIZATION 2 (TWO) TIMES DAILY. 150 mL 11   albuterol  (VENTOLIN  HFA) 108 (90 Base) MCG/ACT inhaler TAKE 2 PUFFS BY MOUTH EVERY 6 HOURS AS NEEDED FOR WHEEZING OR SHORTNESS OF BREATH 18 each 2   budesonide  (PULMICORT ) 0.5 MG/2ML nebulizer solution Take 2 mLs (0.5 mg total) by nebulization 2 (two) times daily. 120 mL 5   citalopram  (CELEXA ) 20 MG tablet Take 1 tablet (20 mg total) by mouth every morning. 90 tablet 3  dextromethorphan-guaiFENesin  (MUCINEX  DM) 30-600 MG 12hr tablet Take 1 tablet by mouth 2 (two) times daily as needed for cough.     faricimab -svoa (VABYSMO ) 6 MG/0.05ML SOLN intravitreal injection 6 mg by Intravitreal route once.     Multiple Vitamin  (MULTI VITAMIN DAILY PO) Take by mouth.     Multiple Vitamins-Minerals (PRESERVISION AREDS 2 PO) Take 1 capsule by mouth daily.     propranolol  (INDERAL ) 10 MG tablet Take 1 tablet (10 mg total) by mouth daily as needed. 30 tablet 0   Tiotropium Bromide-Olodaterol (STIOLTO RESPIMAT ) 2.5-2.5 MCG/ACT AERS Inhale 2 puffs into the lungs daily. 4 g 11   No facility-administered medications prior to visit.    Review of Systems  Constitutional:  Negative for chills, diaphoresis, fever, malaise/fatigue and weight loss.  HENT:  Negative for congestion.   Respiratory:  Positive for shortness of breath. Negative for cough, hemoptysis, sputum production and wheezing.   Cardiovascular:  Negative for chest pain, palpitations and leg swelling.     Objective:   Vitals:   03/08/24 1114  BP: 116/60  Pulse: 85  SpO2: 92%  Weight: 188 lb 12.8 oz (85.6 kg)  Height: 5\' 10"  (1.778 m)    SpO2: 92 %  Physical Exam: General: Well-appearing, no acute distress HENT: [place], AT Eyes: EOMI, no scleral icterus Respiratory: Clear to auscultation bilaterally.  No crackles, wheezing or rales Cardiovascular: RRR, -M/R/G, no JVD Extremities:-Edema,-tenderness Neuro: AAO x4, CNII-XII grossly intact Psych: Normal mood, normal affect  Data Reviewed:  Imaging: CTA 10/10/2019-moderate centrilobular emphysema.  Right upper lobe 3 mm nodular density, right lower lobe 3 mm nodule.  Left upper lobe subpleural 2 and 3 mm nodule.  Left lower lobe 3 mm nodule.  No aneurysm or dissection.  No acute findings in chest abdomen or pelvis CT Chest Lung Cancer 07/06/22 - Aggressive appearing pulmonary nodule measuring 1.14 cm in superior segment of right lower lobe concerning for neoplasm PET/CT 07/25/22 - 11mm RLL nodule with minimal abnormal FDG avidity CT Chest 10/24/22 - 13 mm. Severe emphysema. Irregular solid RLL nodule 13.3 mm, increased from 11/4 mm. Stable RUL 5mm nodule CT Chest 04/14/23 - Severe emphysema. Slightly  diminished size of RLL nodule from 1.4 x 0.9 > 1.2 x 0.6 cm. Unchanged 0.5 cm RUL nodule CT Chest 10/24/23 - Decreased interval size of right lower lobe nodule. Increase in RUL nodule 5x 8 mm, mildly increased. Centrilobular emphysema.  PFT: Spirometry 04/19/22 FVC 1.38 (33%) FEV1 0.72 (24%) Ratio 52   Interpretation: Very severe obstructive defect  Labs: CBC    Component Value Date/Time   WBC 6.7 12/30/2021 1454   WBC 7.6 06/10/2021 0207   RBC 5.20 12/30/2021 1454   RBC 3.27 (L) 06/10/2021 0207   HGB 13.5 12/30/2021 1454   HCT 42.9 12/30/2021 1454   PLT 198 06/10/2021 0207   MCV 83 12/30/2021 1454   MCH 26.0 (L) 12/30/2021 1454   MCH 30.6 06/10/2021 0207   MCHC 31.5 12/30/2021 1454   MCHC 33.3 06/10/2021 0207   RDW 15.4 12/30/2021 1454   LYMPHSABS 1.5 12/30/2021 1454   MONOABS 0.6 06/05/2021 0321   EOSABS 0.4 12/30/2021 1454   BASOSABS 0.1 12/30/2021 1454   Absolute eosinophils  06/05/2021-300 12/30/2021 - 400  ABG    Component Value Date/Time   PHART 7.325 (L) 06/08/2021 2200   PCO2ART 81.4 (HH) 06/08/2021 2200   PO2ART 105 06/08/2021 2200   HCO3 37.8 (H) 06/10/2021 1654   O2SAT 88.0 06/10/2021 1654   Interpretation:  Acute on chronic hypercarbic respiratory failure  CPAP Compliance 05/01/23-05/30/23 Usage 29/30 days (97%) >4 hours 29 days (97%) Avg hours used 6 hours 3 min BiPAP 10/5 AHI 0.9 Leaks Max 7.1  Ambulatory O2   Fri Mar 08, 2024 11:28 AM   POC SATURATION QUALIFICATIONS: (This note is used to comply with regulatory documentation for home oxygen )   Patient Saturations on Room Air at Rest =92 %   Patient Saturations on Room Air while Ambulating = 87%   Patient Saturations on 2 Liters of continuous oxygen  while Ambulating =96 %   Please briefly explain why patient needs home oxygen : to maintain sats above 90 %  Assessment & Plan:   Discussion: 78 year old male with RLL and RUL pulmonary nodules, severe emphysema, chronic hypercarbic respiratory  failure on BiPAP for COPD and nodule follow-up. Ambulatory O2 with desaturations. Qualified for oxygen . Suspect his COPD is progressing. Deferred PFTs as this does not change his overall plans.  COPD, unspecified - worsening dyspnea, progressive oxygen  dependence. Restarting triple therapy --START budesonide  nebulizer 0.5 mg/2ml in the morning and evening. Consider using albuterol  beforehand --CONTINUE Albuterol  nebulizer up to twice a day AS NEEDED for mucous clearance --CONTINUE Stiolto 2.5 TWO puffs ONCE daily.  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 Continue surveillance --Reviewed CT chest in 10/2023 with Radiation Oncology --Next scan due 04/2024  Tobacco cessation --Quit smoking July 2022  Health Maintenance Immunization History  Administered Date(s) Administered   Fluad Quad(high Dose 65+) 10/11/2021, 08/04/2022   Influenza-Unspecified 09/14/2008   PFIZER Comirnaty(Gray Top)Covid-19 Tri-Sucrose Vaccine 06/06/2021   Zoster Recombinant(Shingrix) 10/17/2022   CT Lung Screen- Currently followed by Rad Onc  Orders Placed This Encounter  Procedures   Ambulatory Referral for DME    Referral Priority:   Routine    Referral Type:   Durable Medical Equipment Purchase    Number of Visits Requested:   1   No orders of the defined types were placed in this encounter.  Return in about 1 month (around 04/07/2024) for with NP for ambulatory O2 to qualify for pulsed O2 device.   I have spent a total time of 35-minutes on the day of the appointment including chart review, data review, collecting history, coordinating care and discussing medical diagnosis and plan with the patient/family. Past medical  history, allergies, medications were reviewed. Pertinent imaging, labs and tests included in this note have been reviewed and interpreted independently by me.  Broox Lonigro Genetta Kenning, MD [facility]

## 2024-03-12 ENCOUNTER — Ambulatory Visit (HOSPITAL_BASED_OUTPATIENT_CLINIC_OR_DEPARTMENT_OTHER): Payer: Self-pay | Admitting: Pulmonary Disease

## 2024-03-12 NOTE — Telephone Encounter (Signed)
 Chief Complaint: SOB Symptoms: hypoxia Frequency: x 1 day Pertinent Negatives: Patient denies fever, URI sx, CP, severe SOB Disposition: [] ED /[x] Urgent Care (no appt availability in office) / [] Appointment(In office/virtual)/ []  [facility]/ [] Home Care/ [] Refused Recommended Disposition /[] [facility]/ [x]  Follow-up with Pulm Additional Notes: Pt c/o increased SOB and hypoxia since yesterday. Denies any fevers, URI sx, CP, severe SOB. Pt reports typically wears 2L O2 at nighttime, but has been needing O2 during the daytime d/t hypoxia/SOB. O2 saturations below. Pt reports taking respiratory NEB as prescribed, as well as albuterol  INH. Triager reinforced albuterol  INH/NEB usage. Triager attempted to schedule with [facility], but no access. Triager advised PCP as alternative, but pt prefers F/U with Pulm. Triager will forward encounter for Dr. Washington Hacker to review and advise. Patient verbalized understanding and is expecting call back from office for next steps. Triager also advised that if pt does not hear back from office, to follow disposition for further evaluation/treatment.    Copied from CRM [id number]. Topic: Clinical - Red Word Triage >> Mar 12, 2024  9:18 AM Juliana Ocean wrote: Red Word that prompted transfer to Nurse Triage: trouble breathing/sob Reason for Disposition  [1] Longstanding difficulty breathing (e.g., CHF, COPD, emphysema) AND [2] WORSE than normal  Answer Assessment - Initial Assessment Questions E2C2 Pulmonary Triage - Initial Assessment Questions "Chief Complaint (e.g., cough, sob, wheezing, fever, chills, sweat or additional symptoms) *Go to specific symptom protocol after initial questions. hypoxia  "How long have symptoms been present?" yesterday  Have you tested for COVID or Flu? Note: If not, ask patient if a home test can be taken. If so, instruct patient to call back for positive results. No  MEDICINES:   "Have you used any OTC meds to help with  symptoms?" Yes If yes, ask "What medications?" Mucinex   "Have you used your inhalers/maintenance medication?" Yes If yes, "What medications?" Budesonide  neb - last used yesterday - did not provide relief Albuterol  INH PRN - last used yesterday - provided some relief   If inhaler, ask "How many puffs and how often?" Note: Review instructions on medication in the chart. See above   OXYGEN : "Do you wear supplemental oxygen ?" Yes If yes, "How many liters are you supposed to use?" 2L - ATC Typically wears concentrator at nighttime  "Do you monitor your oxygen  levels?" Yes If yes, "What is your reading (oxygen  level) today?" 87-88 on RA  "What is your usual oxygen  saturation reading?"  (Note: Pulmonary O2 sats should be 90% or greater) 92-93 RA at rest   1. RESPIRATORY STATUS: "Describe your breathing?" (e.g., wheezing, shortness of breath, unable to speak, severe coughing)      SOB 2. ONSET: "When did this breathing problem begin?"      yesterday 3. PATTERN "Does the difficult breathing come and go, or has it been constant since it started?"      constant 4. SEVERITY: "How bad is your breathing?" (e.g., mild, moderate, severe)    - MILD: No SOB at rest, mild SOB with walking, speaks normally in sentences, can lie down, no retractions, pulse < 100.    - MODERATE: SOB at rest, SOB with minimal exertion and prefers to sit, cannot lie down flat, speaks in phrases, mild retractions, audible wheezing, pulse 100-120.    - SEVERE: Very SOB at rest, speaks in single words, struggling to breathe, sitting hunched forward, retractions, pulse > 120      Moderate Reports desaturations off O2 5. RECURRENT SYMPTOM: "Have  you had difficulty breathing before?" If Yes, ask: "When was the last time?" and "What happened that time?"      no 6. CARDIAC HISTORY: "Do you have any history of heart disease?" (e.g., heart attack, angina, bypass surgery, angioplasty)      denies 7. LUNG HISTORY: "Do you  have any history of lung disease?"  (e.g., pulmonary embolus, asthma, emphysema)     COPD 8. CAUSE: "What do you think is causing the breathing problem?"      unknown 9. OTHER SYMPTOMS: "Do you have any other symptoms? (e.g., dizziness, runny nose, cough, chest pain, fever)     Chest tightness "feels a little tight, it's not hurting"  Protocols used: Breathing Difficulty-A-AH

## 2024-03-12 NOTE — Telephone Encounter (Signed)
 Spoke with pt he is not using the Albuterol  neb before the Budesonide  neb he will try this as he says he has a lot of congestion, he did mow the lawn yesterday without wearing his O2 and he thinks maybe he overdid it . Pt will call back with any further questions he also understands he will go to the ER if he gets worse

## 2024-03-14 DIAGNOSIS — J449 Chronic obstructive pulmonary disease, unspecified: Secondary | ICD-10-CM | POA: Diagnosis not present

## 2024-03-16 DIAGNOSIS — J449 Chronic obstructive pulmonary disease, unspecified: Secondary | ICD-10-CM | POA: Diagnosis not present

## 2024-03-28 DIAGNOSIS — H353220 Exudative age-related macular degeneration, left eye, stage unspecified: Secondary | ICD-10-CM | POA: Diagnosis not present

## 2024-03-28 DIAGNOSIS — I700 Atherosclerosis of aorta: Secondary | ICD-10-CM | POA: Diagnosis not present

## 2024-03-28 DIAGNOSIS — J449 Chronic obstructive pulmonary disease, unspecified: Secondary | ICD-10-CM | POA: Diagnosis not present

## 2024-03-28 DIAGNOSIS — L729 Follicular cyst of the skin and subcutaneous tissue, unspecified: Secondary | ICD-10-CM | POA: Diagnosis not present

## 2024-03-28 DIAGNOSIS — R7303 Prediabetes: Secondary | ICD-10-CM | POA: Diagnosis not present

## 2024-03-28 DIAGNOSIS — J9612 Chronic respiratory failure with hypercapnia: Secondary | ICD-10-CM | POA: Diagnosis not present

## 2024-03-28 DIAGNOSIS — F4310 Post-traumatic stress disorder, unspecified: Secondary | ICD-10-CM | POA: Diagnosis not present

## 2024-03-28 DIAGNOSIS — Z8719 Personal history of other diseases of the digestive system: Secondary | ICD-10-CM | POA: Diagnosis not present

## 2024-03-28 DIAGNOSIS — Z8673 Personal history of transient ischemic attack (TIA), and cerebral infarction without residual deficits: Secondary | ICD-10-CM | POA: Diagnosis not present

## 2024-04-02 ENCOUNTER — Encounter: Payer: Self-pay | Admitting: Pharmacist

## 2024-04-02 ENCOUNTER — Other Ambulatory Visit: Payer: Self-pay

## 2024-04-03 ENCOUNTER — Other Ambulatory Visit (HOSPITAL_COMMUNITY): Payer: Self-pay

## 2024-04-03 ENCOUNTER — Other Ambulatory Visit: Payer: Self-pay

## 2024-04-10 ENCOUNTER — Encounter (HOSPITAL_BASED_OUTPATIENT_CLINIC_OR_DEPARTMENT_OTHER): Payer: Self-pay | Admitting: Primary Care

## 2024-04-10 ENCOUNTER — Ambulatory Visit (HOSPITAL_BASED_OUTPATIENT_CLINIC_OR_DEPARTMENT_OTHER): Admitting: Primary Care

## 2024-04-10 VITALS — BP 124/69 | HR 75 | Ht 70.0 in | Wt 189.4 lb

## 2024-04-10 DIAGNOSIS — J9612 Chronic respiratory failure with hypercapnia: Secondary | ICD-10-CM

## 2024-04-10 DIAGNOSIS — J449 Chronic obstructive pulmonary disease, unspecified: Secondary | ICD-10-CM | POA: Diagnosis not present

## 2024-04-10 DIAGNOSIS — Z9981 Dependence on supplemental oxygen: Secondary | ICD-10-CM

## 2024-04-10 DIAGNOSIS — Z87891 Personal history of nicotine dependence: Secondary | ICD-10-CM

## 2024-04-10 NOTE — Progress Notes (Signed)
 @Patient  ID: Martin Mendez, male    DOB: [birth year], 78 y.o.   MRN: [id number]  No chief complaint on file.   Referring provider: Jearldine Mina, MD  HPI: 78 year old male, former smoker quit in 2022. PMH significant for cerebral infarction, COPD, chronic respiratory failure, PTSD, obesity. Patient of Dr. Washington Hacker.   Previous [facility] pulmonary encounter 03/08/24- Dr. Washington Hacker Since our last visit we have had to stop the ICS component of his inhaler due to thrush/hoarsness. Was switched to Stiolto on 02/06/24 due to shortness of breath. But this shortness of breath preceded him being on triple therapy so worried about progression of his COPD. He also reports concerns for borderline low oxygen  saturations. Has started budesonide  and reports initial chest tightness but improves after a minute.   Prior inhalers: Incruse - prefers Spiriva    Social History: Former smoker   COPD, unspecified - fairly controlled, no exacerbations --START budesonide  nebulizer 0.5 mg/2ml in the morning and evening. Consider using albuterol  beforehand --CONTINUE Albuterol  nebulizer up to twice a day AS NEEDED for mucous clearance --CONTINUE Stiolto 2.5 TWO puffs ONCE daily.  --CONTINUE Albuterol  AS NEEDED for shortness of breath or wheezing   Chronic hypercarbic respiratory failure secondary to COPD --START oxygen  at 2L with activity --CONTINUE BiPAP with nightly oxygen    04/10/2024- Interim hx  Discussed the use of AI scribe software for clinical note transcription with the patient, who gave verbal consent to proceed.  History of Present Illness   Martin Mendez is a 78 year old male with COPD who presents for a one month follow-up to evaluate oxygen  use and medication management.  During his last visit, a walk test indicated a drop in oxygen  saturation to 87% on room air, leading to the recommendation for oxygen  use. He has been using oxygen  at night with a BiPAP machine but finds the portable oxygen  tank  cumbersome for daytime activities, particularly when working in the yard. He currently has a home oxygen  generator and a small tank that can be recharged, but he does not use it due to its inconvenience.  He reports a decrease in activity level over the past year due to breathlessness. Regarding his COPD management, he was recently started on budesonide  nebulizer (Pulmicort ) twice a day along with albuterol . He also uses Stiolto Respimat . He has not noticed improvement with the budesonide  and experiences chest tightness after its use, requiring the use of his albuterol  inhaler for relief. He previously used Symbicort  and Spiriva  but discontinued them due to thrush.  He experiences mucus buildup every two days, requiring significant coughing to clear it. He uses Mucinex  once a day to manage this symptom. He does not have a flutter valve, which could potentially help with mucus clearance.  His current medications include budesonide  nebulizer twice daily, albuterol  as needed, Stiolto Respimat , and Mucinex  once daily.      Allergies  Allergen Reactions   Penicillins Swelling    Did it involve swelling of the face/tongue/throat, SOB, or low BP? No Did it involve sudden or severe Mendez/hives, skin peeling, or any reaction on the inside of your mouth or nose? No Did you need to seek medical attention at a hospital or doctor's office? No When did it last happen?    55 years ago (age 34)   If all above answers are "NO", may proceed with cephalosporin use.  Swelling of arms and legs   Dupixent  [Dupilumab ] Mendez   Sulfa Antibiotics Mendez    tongue  Immunization History  Administered Date(s) Administered   Fluad Quad(high Dose 65+) 10/11/2021, 08/04/2022   Influenza-Unspecified 09/14/2008   PFIZER Comirnaty(Gray Top)Covid-19 Tri-Sucrose Vaccine 06/06/2021   Zoster Recombinant(Shingrix) 10/17/2022    Past Medical History:  Diagnosis Date   COPD (chronic obstructive pulmonary disease) (HCC)     History of radiation therapy    Right Lung- 11/24/22-12/01/22- Dr. Retta Caster   PTSD (post-traumatic stress disorder)     Tobacco History: Social History   Tobacco Use  Smoking Status Former   Current packs/day: 0.00   Average packs/day: 1 pack/day for 60.0 years (60.0 ttl pk-yrs)   Types: Cigarettes   Start date: 06/04/1961   Quit date: 06/04/2021   Years since quitting: 2.8  Smokeless Tobacco Never   Counseling given: Not Answered   Outpatient Medications Prior to Visit  Medication Sig Dispense Refill   acetaminophen  (TYLENOL ) 500 MG tablet Take 500 mg by mouth as needed.     albuterol  (ACCUNEB ) 0.63 MG/3ML nebulizer solution TAKE 3 MLS (0.63 MG TOTAL) BY NEBULIZATION 2 (TWO) TIMES DAILY. 150 mL 11   albuterol  (VENTOLIN  HFA) 108 (90 Base) MCG/ACT inhaler TAKE 2 PUFFS BY MOUTH EVERY 6 HOURS AS NEEDED FOR WHEEZING OR SHORTNESS OF BREATH 18 each 2   budesonide  (PULMICORT ) 0.5 MG/2ML nebulizer solution Take 2 mLs (0.5 mg total) by nebulization 2 (two) times daily. 120 mL 5   citalopram  (CELEXA ) 20 MG tablet Take 1 tablet (20 mg total) by mouth every morning. 90 tablet 3   dextromethorphan-guaiFENesin  (MUCINEX  DM) 30-600 MG 12hr tablet Take 1 tablet by mouth 2 (two) times daily as needed for cough.     faricimab -svoa (VABYSMO ) 6 MG/0.05ML SOLN intravitreal injection 6 mg by Intravitreal route once.     Multiple Vitamin (MULTI VITAMIN DAILY PO) Take by mouth.     Multiple Vitamins-Minerals (PRESERVISION AREDS 2 PO) Take 1 capsule by mouth daily.     propranolol  (INDERAL ) 10 MG tablet Take 1 tablet (10 mg total) by mouth daily as needed. 30 tablet 0   Tiotropium Bromide-Olodaterol (STIOLTO RESPIMAT ) 2.5-2.5 MCG/ACT AERS Inhale 2 puffs into the lungs daily. 4 g 11   No facility-administered medications prior to visit.      Review of Systems  Review of Systems   Physical Exam  There were no vitals taken for this visit. Physical Exam   Lab Results:  CBC    Component  Value Date/Time   WBC 6.7 12/30/2021 1454   WBC 7.6 06/10/2021 0207   RBC 5.20 12/30/2021 1454   RBC 3.27 (L) 06/10/2021 0207   HGB 13.5 12/30/2021 1454   HCT 42.9 12/30/2021 1454   PLT 198 06/10/2021 0207   MCV 83 12/30/2021 1454   MCH 26.0 (L) 12/30/2021 1454   MCH 30.6 06/10/2021 0207   MCHC 31.5 12/30/2021 1454   MCHC 33.3 06/10/2021 0207   RDW 15.4 12/30/2021 1454   LYMPHSABS 1.5 12/30/2021 1454   MONOABS 0.6 06/05/2021 0321   EOSABS 0.4 12/30/2021 1454   BASOSABS 0.1 12/30/2021 1454    BMET    Component Value Date/Time   NA 139 12/30/2021 1454   K 5.1 12/30/2021 1454   CL 100 12/30/2021 1454   CO2 28 12/30/2021 1454   GLUCOSE 75 12/30/2021 1454   GLUCOSE 100 (H) 06/08/2021 0101   BUN 17 12/30/2021 1454   CREATININE 0.80 12/30/2021 1454   CALCIUM 9.4 12/30/2021 1454   GFRNONAA >60 06/08/2021 0101   GFRAA >60 10/11/2019 0612    BNP  Component Value Date/Time   BNP 85.0 12/10/2018 0851    ProBNP No results found for: "PROBNP"  Imaging: No results found.   Assessment & Plan:   1. COPD, severe (HCC) (Primary) - Ambulatory Referral for DME  Assessment and Plan    Chronic Obstructive Pulmonary Disease (COPD) COPD with recent initiation of budesonide  nebulizer therapy. Budesonide  causes chest tightness without improvement. Previous use of Symbicort  and Spiriva  resulted in thrush. Current regimen includes Stiolto and albuterol  inhaler. Discussed medication timing and potential adjustments to alleviate symptoms. Advised to try budesonide  first to see if it reduces chest tightness without prior albuterol  use. - Use Stiolto first thing in the morning. - Administer budesonide  nebulizer morning (after breakfast) and evening (before bed) - Discontinue albuterol  nebulizer before budesonide . - Use albuterol  inhaler only if experiencing chest tightness after budesonide . - Consider discontinuing budesonide  if chest tightness persists.  Oxygen  dependence Oxygen   dependence due to COPD. Previous qualification for oxygen  tanks, but not delivered. Current use of a home oxygen  generator and a small self-fill tank, which is cumbersome. Plan to assess suitability of a portable concentrator with pulsed oxygen  delivery to ensure adequate oxygenation during activity. - Conduct walk test with portable concentrator to ensure oxygen  levels remain above 88-90%. Patient was able to maintain O2 95% on 2L POC.  - Advised patient use portable concentrator when leaving home and during significant walking. - Continue using oxygen  @2L  with BiPAP at night.  Mucus production Intermittent mucus production with coughing episodes every two days. Currently using Mucinex  once daily. Discussed potential benefits of increasing Mucinex  frequency and using a flutter valve to aid mucus clearance. Aim to clear lungs and reduce congestion. - Increase Mucinex  to twice daily. - Provide flutter valve or acapella device to assist with mucus clearance after nebulizer treatments.  Recording duration: 13 minutes      Antonio Baumgarten, NP 04/10/2024

## 2024-04-10 NOTE — Patient Instructions (Signed)
  VISIT SUMMARY: You came in today for a follow-up visit to evaluate your oxygen  use and medication management for COPD. We discussed your current symptoms, including breathlessness, chest tightness, and mucus buildup, and reviewed your current medications and oxygen  therapy.  YOUR PLAN: -CHRONIC OBSTRUCTIVE PULMONARY DISEASE (COPD): COPD is a chronic lung disease that makes it hard to breathe. We discussed adjusting your medication schedule to help reduce chest tightness. You should use Stiolto first thing in the morning, take the budesonide  nebulizer after breakfast, and only use the albuterol  inhaler if you experience chest tightness after using budesonide . If the chest tightness continues, we may consider discontinuing budesonide .  -OXYGEN  DEPENDENCE: Due to your COPD, you need supplemental oxygen . We will conduct a walk test with a portable concentrator to ensure your oxygen  levels stay above 88-90% during activity. You should use the portable concentrator when leaving home and during significant walking, and continue using oxygen  with your BiPAP machine at night.  -MUCUS PRODUCTION: You have mucus buildup that requires frequent coughing to clear. We recommend increasing your Mucinex  to twice daily and using a flutter valve or acapella device to help clear mucus after nebulizer treatments.  INSTRUCTIONS: We will conduct a walk test with a portable concentrator to ensure your oxygen  levels stay above 88-90% during activity. Please follow the new medication schedule and report any persistent chest tightness. Increase Mucinex  to twice daily and use the flutter valve or acapella device as instructed. Continue using oxygen  with your BiPAP machine at night.  Follow-up OV in August or September with Dr. Washington Hacker or sooner if needed

## 2024-04-10 NOTE — Addendum Note (Signed)
 Addended by: Kary Pages on: 04/10/2024 02:25 PM   Modules accepted: Orders

## 2024-04-14 ENCOUNTER — Other Ambulatory Visit: Payer: Self-pay | Admitting: Pulmonary Disease

## 2024-04-14 DIAGNOSIS — J449 Chronic obstructive pulmonary disease, unspecified: Secondary | ICD-10-CM | POA: Diagnosis not present

## 2024-04-18 ENCOUNTER — Telehealth: Payer: Self-pay | Admitting: *Deleted

## 2024-04-18 NOTE — Telephone Encounter (Signed)
 CALLED PATIENT TO INFORM OF CT FOR 04-29-24- ARRIVAL TIME- 5 PM @ [facility], NO RESTRICTIONS TO SCAN, PATIENT TO RECEIVE RESULTS FROM DR. KINARD ON 05-06-24 @ 11:45 AM, SPOKE WITH PATIENT AND HE IS AWARE OF THESE APPTS. AND THE INSTRUCTIONS

## 2024-04-29 ENCOUNTER — Ambulatory Visit (HOSPITAL_COMMUNITY): Admission: RE | Admit: 2024-04-29 | Source: Ambulatory Visit

## 2024-04-29 ENCOUNTER — Encounter (HOSPITAL_COMMUNITY): Payer: Self-pay

## 2024-04-30 ENCOUNTER — Telehealth: Payer: Self-pay | Admitting: *Deleted

## 2024-04-30 NOTE — Telephone Encounter (Signed)
 CALLED PATIENT TO INFORM OF CT FOR 05-06-24- ARRIVAL TIME- 12:15 PM @ [facility]- NO RESTRICTIONS TO SCAN, PATIENT TO RECEIVE RESULTS FROM  DR. KINARD ON 05-09-24 @ 3:45 PM, SPOKE WITH PATIENT AND HE IS AWARE OF THESE APPTS. AND THE INSTRUCTIONS

## 2024-05-06 ENCOUNTER — Other Ambulatory Visit (HOSPITAL_COMMUNITY)

## 2024-05-06 ENCOUNTER — Ambulatory Visit: Payer: Self-pay | Admitting: Radiation Oncology

## 2024-05-06 ENCOUNTER — Ambulatory Visit (HOSPITAL_BASED_OUTPATIENT_CLINIC_OR_DEPARTMENT_OTHER)
Admission: RE | Admit: 2024-05-06 | Discharge: 2024-05-06 | Disposition: A | Discharge status: Home / Self Care | Source: Ambulatory Visit | Attending: Radiation Oncology | Admitting: Radiation Oncology

## 2024-05-06 DIAGNOSIS — J432 Centrilobular emphysema: Secondary | ICD-10-CM | POA: Diagnosis not present

## 2024-05-06 DIAGNOSIS — R911 Solitary pulmonary nodule: Secondary | ICD-10-CM | POA: Insufficient documentation

## 2024-05-06 DIAGNOSIS — I700 Atherosclerosis of aorta: Secondary | ICD-10-CM | POA: Diagnosis not present

## 2024-05-06 DIAGNOSIS — C3490 Malignant neoplasm of unspecified part of unspecified bronchus or lung: Secondary | ICD-10-CM | POA: Diagnosis not present

## 2024-05-08 NOTE — Progress Notes (Signed)
 Radiation Oncology         [phone] ________________________________  Name: Martin Mendez MRN: [id number]  Date: 05/09/2024  DOB: [birth year]  Follow-Up Visit Note  CC: Ransom Other, MD  Kassie Acquanetta Bradley, MD    ICD-10-CM   1. Right lower lobe pulmonary nodule  R91.1 CT CHEST WO CONTRAST    CANCELED: CT CHEST WO CONTRAST       Diagnosis: Putative stage IA2 (cT1b, N0, M0) NSCLC of the RLL; s/p SBRT completed on 12/01/2022   Interval Since Last Radiation: 1 year 6 months 8 days   Intent: Curative  Radiation Treatment Dates: 11/24/2022 through 12/01/2022 Site Technique Total Dose (Gy) Dose per Fx (Gy) Completed Fx Beam Energies  Lung, Right: Lung_R IMRT 54/54 18 3/3 6XFFF     Narrative:  The patient returns today for routine follow-up and to review most recent imaging. He was last seen in office on 10/30/23 for a routine follow up. Patient continued to follow up with their specialists to manage their chronic conditions.   In the interval since he was last seen, he followed up with NP Almarie Ferrari to manage his COPD. During his most recent follow up on 04/10/24, budesonide  nebulizer was initiated along with continuing daily Stiolto 2.5 and albuterol  as needed.    Most recent CT chest preformed on 05/06/24 showed the previously RUL pulmonary nodule to be stable at 8 mm in size and unchanged from 10/24/2023. The previously treated RLL is also stable.      No other significant oncologic interval history since the patient was last seen.                             Pain:Denies Shortness of breath w/wo exertion: Denies Cough: Intermittently from COPD Hemoptysis: Denies Pain with swallowing: Denies Swallowing/choking concerns: Denies Appetite: Good Energy Level: Low because of the shortness of breath  Allergies:  is allergic to penicillins, dupixent  [dupilumab ], and sulfa antibiotics.  Meds: Current Outpatient Medications  Medication Sig Dispense Refill   acetaminophen   (TYLENOL ) 500 MG tablet Take 500 mg by mouth as needed.     albuterol  (ACCUNEB ) 0.63 MG/3ML nebulizer solution TAKE 3 MLS (0.63 MG TOTAL) BY NEBULIZATION 2 (TWO) TIMES DAILY. 150 mL 11   albuterol  (VENTOLIN  HFA) 108 (90 Base) MCG/ACT inhaler TAKE 2 PUFFS BY MOUTH EVERY 6 HOURS AS NEEDED FOR WHEEZING OR SHORTNESS OF BREATH 18 each 2   budesonide  (PULMICORT ) 0.5 MG/2ML nebulizer solution Take 2 mLs (0.5 mg total) by nebulization 2 (two) times daily. 120 mL 5   citalopram  (CELEXA ) 20 MG tablet Take 1 tablet (20 mg total) by mouth every morning. 90 tablet 3   dextromethorphan-guaiFENesin  (MUCINEX  DM) 30-600 MG 12hr tablet Take 1 tablet by mouth 2 (two) times daily as needed for cough.     faricimab -svoa (VABYSMO ) 6 MG/0.05ML SOLN intravitreal injection 6 mg by Intravitreal route once.     Multiple Vitamin (MULTI VITAMIN DAILY PO) Take by mouth.     Multiple Vitamins-Minerals (PRESERVISION AREDS 2 PO) Take 1 capsule by mouth daily.     propranolol  (INDERAL ) 10 MG tablet Take 1 tablet (10 mg total) by mouth daily as needed. 30 tablet 0   Tiotropium Bromide-Olodaterol (STIOLTO RESPIMAT ) 2.5-2.5 MCG/ACT AERS Inhale 2 puffs into the lungs daily. 4 g 11   No current facility-administered medications for this encounter.    Physical Findings: The patient is in no acute distress. Patient is alert  and oriented.  height is 5' 10 (1.778 m) and weight is 190 lb (86.2 kg). His temporal temperature is 97.1 F (36.2 C) (abnormal). His blood pressure is 126/79 and his pulse is 76. His respiration is 18 and oxygen  saturation is 94%. .  No significant changes. Lungs are clear to auscultation bilaterally. Heart has regular rate and rhythm. No palpable cervical, supraclavicular, or axillary adenopathy. Abdomen soft, non-tender, normal bowel sounds.   Lab Findings: Lab Results  Component Value Date   WBC 6.7 12/30/2021   HGB 13.5 12/30/2021   HCT 42.9 12/30/2021   MCV 83 12/30/2021   PLT 198 06/10/2021     Radiographic Findings: CT CHEST WO CONTRAST Result Date: 05/08/2024 CLINICAL DATA:  Non-small cell lung cancer, assess treatment response. * Tracking Code: BO * EXAM: CT CHEST WITHOUT CONTRAST TECHNIQUE: Multidetector CT imaging of the chest was performed following the standard protocol without IV contrast. RADIATION DOSE REDUCTION: This exam was performed according to the departmental dose-optimization program which includes automated exposure control, adjustment of the mA and/or kV according to patient size and/or use of iterative reconstruction technique. COMPARISON:  10/24/2023. FINDINGS: Cardiovascular: Atherosclerotic calcification of the aorta, aortic valve and coronary arteries. Heart size normal. No pericardial effusion. Mediastinum/Nodes: No pathologically enlarged mediastinal or axillary lymph nodes. Hilar regions are difficult to definitively evaluate without IV contrast. Air in the esophagus can be seen with dysmotility. Lungs/Pleura: Centrilobular and paraseptal emphysema. Subpleural scarring in the right lower lobe. Pulmonary nodules measure up to 8 mm in the posterior right upper lobe (3/52), stable from 10/24/2023. No pleural fluid. Adherent debris in the airway. Upper Abdomen: Small hepatic cysts. Left renal stones. Visualized portions of the liver, gallbladder, adrenal glands, kidneys, spleen, pancreas, stomach and bowel are otherwise grossly unremarkable. No upper abdominal adenopathy. Gastroduodenal artery embolization. Musculoskeletal: Degenerative changes in the spine. No worrisome lytic or sclerotic lesions. IMPRESSION: 1. Post treatment scarring in the right lower lobe. Pulmonary nodules measure 8 mm or less in size and are unchanged from 10/24/2023. Recommend continued attention on follow-up. 2. Left renal stones. 3. Aortic atherosclerosis (ICD10-I70.0). Coronary artery calcification. 4.  Emphysema (ICD10-J43.9). Electronically Signed   By: Newell Eke M.D.   On: 05/08/2024  16:33    Impression: Putative stage IA2 (cT1b, N0, M0) NSCLC of the RLL; s/p SBRT completed on 12/01/2022  The patient is doing well overall today, no evidence of recurrence on clinical exam today. We personally reviewed the results of his most recent CT which demonstrated stable post-radiation changes in the RLL with no evidence of disease progression or metastasis. The previously noted RUL pulmonary nodule has remained stable at 8 mm.   Plan:  Follow up chest CT in 6 months. Routine office visit 1-2 days after scan to review results. Patient was encouraged to call with any questions or concerns in the meantime.    20 minutes of total time was spent for this patient encounter, including preparation, face-to-face counseling with the patient and coordination of care, physical exam, and documentation of the encounter. ____________________________________   Leeroy Due, PA-C   This document serves as a record of services personally performed by Leeroy Due, PA-C. It was created on her behalf by Reymundo Cartwright, a trained medical scribe. The creation of this record is based on the scribe's personal observations and the provider's statements to them. This document has been checked and approved by the attending provider.

## 2024-05-09 ENCOUNTER — Ambulatory Visit
Admission: RE | Admit: 2024-05-09 | Discharge: 2024-05-09 | Disposition: A | Discharge status: Home / Self Care | Source: Ambulatory Visit | Attending: Radiation Oncology | Admitting: Radiation Oncology

## 2024-05-09 VITALS — BP 126/79 | HR 76 | Temp 97.1°F | Resp 18 | Ht 70.0 in | Wt 190.0 lb

## 2024-05-09 DIAGNOSIS — Z79899 Other long term (current) drug therapy: Secondary | ICD-10-CM | POA: Diagnosis not present

## 2024-05-09 DIAGNOSIS — J432 Centrilobular emphysema: Secondary | ICD-10-CM | POA: Insufficient documentation

## 2024-05-09 DIAGNOSIS — J449 Chronic obstructive pulmonary disease, unspecified: Secondary | ICD-10-CM | POA: Insufficient documentation

## 2024-05-09 DIAGNOSIS — K7689 Other specified diseases of liver: Secondary | ICD-10-CM | POA: Insufficient documentation

## 2024-05-09 DIAGNOSIS — R911 Solitary pulmonary nodule: Secondary | ICD-10-CM

## 2024-05-09 DIAGNOSIS — Z7951 Long term (current) use of inhaled steroids: Secondary | ICD-10-CM | POA: Diagnosis not present

## 2024-05-09 DIAGNOSIS — R918 Other nonspecific abnormal finding of lung field: Secondary | ICD-10-CM | POA: Insufficient documentation

## 2024-05-09 DIAGNOSIS — N200 Calculus of kidney: Secondary | ICD-10-CM | POA: Insufficient documentation

## 2024-05-09 DIAGNOSIS — I2510 Atherosclerotic heart disease of native coronary artery without angina pectoris: Secondary | ICD-10-CM | POA: Diagnosis not present

## 2024-05-09 DIAGNOSIS — I700 Atherosclerosis of aorta: Secondary | ICD-10-CM | POA: Insufficient documentation

## 2024-05-09 DIAGNOSIS — Z923 Personal history of irradiation: Secondary | ICD-10-CM | POA: Insufficient documentation

## 2024-05-09 NOTE — Progress Notes (Signed)
 Martin Mendez Bring is here today for follow up post radiation to the lung and to get results from scan.  Lung Side: Right  Does the patient complain of any of the following: Pain:Denies Shortness of breath w/wo exertion: Denies Cough: Intermittently from COPD Hemoptysis: Denies Pain with swallowing: Denies Swallowing/choking concerns: Denies Appetite: Good Energy Level: Low because of the shortness of breath Post radiation skin Changes:       BP 126/79 (BP Location: Left Arm, Patient Position: Sitting)   Pulse 76   Temp (!) 97.1 F (36.2 C) (Temporal)   Resp 18   Ht 5' 10 (1.778 m)   Wt 190 lb (86.2 kg)   SpO2 94%   BMI 27.26 kg/m

## 2024-05-14 DIAGNOSIS — J449 Chronic obstructive pulmonary disease, unspecified: Secondary | ICD-10-CM | POA: Diagnosis not present

## 2024-05-16 ENCOUNTER — Other Ambulatory Visit (HOSPITAL_BASED_OUTPATIENT_CLINIC_OR_DEPARTMENT_OTHER): Payer: Self-pay

## 2024-06-06 DIAGNOSIS — H353111 Nonexudative age-related macular degeneration, right eye, early dry stage: Secondary | ICD-10-CM | POA: Diagnosis not present

## 2024-06-06 DIAGNOSIS — H2513 Age-related nuclear cataract, bilateral: Secondary | ICD-10-CM | POA: Diagnosis not present

## 2024-06-06 DIAGNOSIS — H35371 Puckering of macula, right eye: Secondary | ICD-10-CM | POA: Diagnosis not present

## 2024-06-06 DIAGNOSIS — H353221 Exudative age-related macular degeneration, left eye, with active choroidal neovascularization: Secondary | ICD-10-CM | POA: Diagnosis not present

## 2024-06-06 DIAGNOSIS — H43813 Vitreous degeneration, bilateral: Secondary | ICD-10-CM | POA: Diagnosis not present

## 2024-06-12 ENCOUNTER — Other Ambulatory Visit (HOSPITAL_BASED_OUTPATIENT_CLINIC_OR_DEPARTMENT_OTHER): Payer: Self-pay

## 2024-07-03 ENCOUNTER — Telehealth (HOSPITAL_BASED_OUTPATIENT_CLINIC_OR_DEPARTMENT_OTHER): Payer: Self-pay

## 2024-07-03 NOTE — Telephone Encounter (Signed)
 Spoke with pt he states he is getting an error on his POC that states he is at 87% the machine beeps then pulses fast. He has reached out to [employer] and they are bringing him a new machine out. He wonders if this is the correct thing to do. I advised him yes and the POC has no way of checking his POX so if it does this again to please put his oximeter on and check it. He states he believes it is a machine malfunction.   Copied from CRM #[id number]. Topic: Clinical - Order For Equipment >> Jul 03, 2024 11:44 AM Martin Mendez wrote: Reason for CRM: pt would like Zahli Vetsch to give hima call concerning a POC. [phone] (H)

## 2024-07-05 ENCOUNTER — Other Ambulatory Visit: Payer: Self-pay | Admitting: Pulmonary Disease

## 2024-07-09 ENCOUNTER — Other Ambulatory Visit (HOSPITAL_BASED_OUTPATIENT_CLINIC_OR_DEPARTMENT_OTHER): Payer: Self-pay | Admitting: Pulmonary Disease

## 2024-07-09 MED ORDER — ALBUTEROL SULFATE HFA 108 (90 BASE) MCG/ACT IN AERS: 2.0000 | INHALATION_SPRAY | Freq: Four times a day (QID) | RESPIRATORY_TRACT | 2 refills | Status: DC | PRN

## 2024-07-09 NOTE — Telephone Encounter (Signed)
 Copied from CRM #[id number]. Topic: Clinical - Medication Refill >> Jul 09, 2024 11:10 AM Leila BROCKS wrote: Most Recent Pulmonary Care Visit:  Provider: Dr. Acquanetta Slater Staff 03/08/24 and NP, Hope 04/10/24 Department: [facility]  Medication(s): albuterol  (VENTOLIN  HFA) 108 (90 Base) MCG/ACT inhaler   Has the patient contacted their pharmacy? Yes. The pharmacy has sent a request a few times with no reply.  (Agent: If no, request that the patient contact the pharmacy for the refill. If patient does not wish to contact the pharmacy document the reason why and proceed with request.) (Agent: If yes, when and what did the pharmacy advise?)  This is the patient's preferred pharmacy:  [id number] [place], [place] - [address] [place] [address] Phone: [phone] Fax: [phone]  Is this the correct pharmacy for this prescription? Yes If no, delete pharmacy and type the correct one.   Has the prescription been filled recently? No  Is the patient out of the medication? No, but will run out soon.   Has the patient been seen for an appointment in the last year OR does the patient have an upcoming appointment? Yes, 08/01/24 with Dr. Staff  Can we respond through MyChart? No, called [phone] if needed.   Agent: Please be advised that Rx refills may take up to 3 business days. We ask that you follow-up with your pharmacy.

## 2024-07-24 DIAGNOSIS — H2513 Age-related nuclear cataract, bilateral: Secondary | ICD-10-CM | POA: Diagnosis not present

## 2024-08-01 ENCOUNTER — Telehealth (HOSPITAL_BASED_OUTPATIENT_CLINIC_OR_DEPARTMENT_OTHER): Payer: Self-pay

## 2024-08-01 ENCOUNTER — Encounter (HOSPITAL_BASED_OUTPATIENT_CLINIC_OR_DEPARTMENT_OTHER): Payer: Self-pay | Admitting: Pulmonary Disease

## 2024-08-01 ENCOUNTER — Ambulatory Visit (HOSPITAL_BASED_OUTPATIENT_CLINIC_OR_DEPARTMENT_OTHER): Admitting: Pulmonary Disease

## 2024-08-01 VITALS — BP 113/73 | HR 89 | Ht 70.0 in | Wt 184.0 lb

## 2024-08-01 DIAGNOSIS — J9612 Chronic respiratory failure with hypercapnia: Secondary | ICD-10-CM | POA: Diagnosis not present

## 2024-08-01 DIAGNOSIS — Z87891 Personal history of nicotine dependence: Secondary | ICD-10-CM

## 2024-08-01 DIAGNOSIS — R918 Other nonspecific abnormal finding of lung field: Secondary | ICD-10-CM

## 2024-08-01 DIAGNOSIS — J449 Chronic obstructive pulmonary disease, unspecified: Secondary | ICD-10-CM | POA: Diagnosis not present

## 2024-08-01 DIAGNOSIS — R911 Solitary pulmonary nodule: Secondary | ICD-10-CM

## 2024-08-01 MED ORDER — AZITHROMYCIN 250 MG PO TABS: 250.0000 mg | ORAL_TABLET | Freq: Every day | ORAL | 5 refills | Status: DC

## 2024-08-01 NOTE — Telephone Encounter (Signed)
 Copied from CRM [id number]. Topic: Clinical - Prescription Issue >> Aug 01, 2024  2:06 PM Rilla B wrote: Reason for CRM: Patient was prescript Azithromycin .  He states in 2022 he had a bleeding ulcer and wants to make sure it's okay to take and it won't do anything to his stomach.  Please call patient @ [phone].

## 2024-08-01 NOTE — Telephone Encounter (Signed)
 Called re: azithromycin . No significant risk of ulcers associated with this medication. Patient appreciated callback.

## 2024-08-01 NOTE — Patient Instructions (Signed)
 COPD, unspecified - worsening bronchitis --CONTINUE budesonide  nebulizer 0.5 mg/2ml in the morning and evening. Consider using albuterol  beforehand --CONTINUE Albuterol  nebulizer up to twice a day AS NEEDED for mucous clearance --CONTINUE Stiolto 2.5 TWO puffs ONCE daily.  --CONTINUE Albuterol  AS NEEDED for shortness of breath or wheezing --START azithromycin  250 mg daily. Will need EKG at next visit to check QTC  Chronic hypercarbic respiratory failure secondary to COPD --CONTINUE oxygen  at 2L pulsed with activity --CONTINUE BiPAP with nightly oxygen  --DME: [employer] --Patient is considering switching to [employer]. Will let office know if insurance approves  Right lower lobe nodule - continue to improve in size Right upper lobe nodule - slightly increased in size. Continue surveillance --Reviewed CT chest in 04/2024 with Radiation Oncology --Next scan due 10/2024

## 2024-08-01 NOTE — Progress Notes (Unsigned)
 Subjective:   PATIENT ID: Martin Mendez Bring GENDER: male DOB: Nov 14, 1946, MRN: [id number]   HPI  Chief Complaint  Patient presents with   Follow-up    COPD     Reason for Visit: Follow-up  [name] is a 78 year old male former smoker with emphysema and PTSD who presents for follow-up  Synopsis: He was admitted from 06/05/2021 to 06/09/2021 for hematemesis.  EGD on 06/05/2021 demonstrated gastritis, duodenitis and nonbleeding duodenal ulcer.  His hospital course was complicated by acute respiratory failure with hypercapnia.  He was treated with noninvasive ventilation with improvement.  Pulmonary was consulted while inpatient and recommended noninvasive ventilation for outpatient management. COPD well-controlled with Symbicort , Spiriva  and BiPAP  12/49/23 Wife present and provides additional support. Since our last visit we have discussed his recent CT and PET imaging concerning for possible slow growing RLL lung lesion concerning for primary lung cancer. He is scheduled with radiation oncology for further discussion and likely plan for radiation if offered. His overall lung health is fair and he has been compliant with BiPAP which improves his breathing. Compliant with bronchodilators. No exacerbations since 05/2021. He reports he is being treated with ARED2 for AMD, which is less likely to contribute to risk of lung cancer.  05/30/23 Wife present and provides additional history. Has completed radiation with improved RLL nodule size on May CT. He is compliant with BiPAP nightly and occasionally wife reports some leaking at night. He reports occasional cough every 3-4 days and once he produces sputum he is fine. Compliant with his inhalers. Uses nebulizer for these episodes. Will have hoarseness by the end of the day. Using BiPAP humidifier.   12/05/23 Since our last visit he reports stable shortness of breath, coughing with good sputum production. No wheezing. Compliant with BiPAP  nightly. Compliant with Symbicort  and Spiriva . Uses 0-3 albuterol  as needed. Uses nebulzer once a week to help facilitate sputum production as mucinex . Denies exacerbations requiring antibiotics or prednisone . Does have mild cold symptoms that resolve quickly. During the day oxygen  level range from 92-97%  03/08/24 Since our last visit we have had to stop the ICS component of his inhaler due to thrush/hoarsness. Was switched to Stiolto on 02/06/24 due to shortness of breath. But this shortness of breath preceded him being on triple therapy so worried about progression of his COPD. He also reports concerns for borderline low oxygen  saturations. Has started budesonide  and reports initial chest tightness but improves after a minute.  08/01/24 Since our last visit overall doing well on Stiolto and albuterol  neb followed by budesonide  neb twice a day. He continues to have mucous production with chest congestion. He is using his albuterol  inhaler more often. He reports his POC is not working appropriately and currently on his 3rd device.   Prior inhalers: Incruse - prefers Spiriva   Social History: Former smoker  Past Medical History:  Diagnosis Date   COPD (chronic obstructive pulmonary disease) (HCC)    History of radiation therapy    Right Lung- 11/24/22-12/01/22- Dr. Lynwood Nasuti   PTSD (post-traumatic stress disorder)     Outpatient Medications Prior to Visit  Medication Sig Dispense Refill   acetaminophen  (TYLENOL ) 500 MG tablet Take 500 mg by mouth as needed.     albuterol  (ACCUNEB ) 0.63 MG/3ML nebulizer solution TAKE 3 MLS (0.63 MG TOTAL) BY NEBULIZATION 2 (TWO) TIMES DAILY. 150 mL 11   albuterol  (VENTOLIN  HFA) 108 (90 Base) MCG/ACT inhaler Inhale 2 puffs into the lungs every 6 (six)  hours as needed for wheezing or shortness of breath. 18 each 2   budesonide  (PULMICORT ) 0.5 MG/2ML nebulizer solution Take 2 mLs (0.5 mg total) by nebulization 2 (two) times daily. 120 mL 5   citalopram  (CELEXA )  20 MG tablet Take 1 tablet (20 mg total) by mouth every morning. 90 tablet 3   dextromethorphan-guaiFENesin  (MUCINEX  DM) 30-600 MG 12hr tablet Take 1 tablet by mouth 2 (two) times daily as needed for cough.     faricimab -svoa (VABYSMO ) 6 MG/0.05ML SOLN intravitreal injection 6 mg by Intravitreal route once.     Multiple Vitamin (MULTI VITAMIN DAILY PO) Take by mouth.     Multiple Vitamins-Minerals (PRESERVISION AREDS 2 PO) Take 1 capsule by mouth daily.     propranolol  (INDERAL ) 10 MG tablet Take 1 tablet (10 mg total) by mouth daily as needed. 30 tablet 0   Tiotropium Bromide-Olodaterol (STIOLTO RESPIMAT ) 2.5-2.5 MCG/ACT AERS Inhale 2 puffs into the lungs daily. 4 g 11   No facility-administered medications prior to visit.    Review of Systems  Constitutional:  Negative for chills, diaphoresis, fever, malaise/fatigue and weight loss.  HENT:  Negative for congestion.   Respiratory:  Positive for sputum production. Negative for cough, hemoptysis, shortness of breath and wheezing.   Cardiovascular:  Negative for chest pain, palpitations and leg swelling.     Objective:   Vitals:   08/01/24 1042  BP: 113/73  Pulse: 89  SpO2: 91%  Weight: 184 lb (83.5 kg)  Height: 5' 10 (1.778 m)    SpO2: 91 %  Physical Exam: General: Well-appearing, no acute distress HENT: [place], AT Eyes: EOMI, no scleral icterus Respiratory: Clear to auscultation bilaterally.  No crackles, wheezing or rales Cardiovascular: RRR, -M/R/G, no JVD Extremities:-Edema,-tenderness Neuro: AAO x4, CNII-XII grossly intact Psych: Normal mood, normal affect  Data Reviewed:  Imaging: CTA 10/10/2019-moderate centrilobular emphysema.  Right upper lobe 3 mm nodular density, right lower lobe 3 mm nodule.  Left upper lobe subpleural 2 and 3 mm nodule.  Left lower lobe 3 mm nodule.  No aneurysm or dissection.  No acute findings in chest abdomen or pelvis CT Chest Lung Cancer 07/06/22 - Aggressive appearing pulmonary nodule  measuring 1.14 cm in superior segment of right lower lobe concerning for neoplasm PET/CT 07/25/22 - 11mm RLL nodule with minimal abnormal FDG avidity CT Chest 10/24/22 - 13 mm. Severe emphysema. Irregular solid RLL nodule 13.3 mm, increased from 11/4 mm. Stable RUL 5mm nodule CT Chest 04/14/23 - Severe emphysema. Slightly diminished size of RLL nodule from 1.4 x 0.9 > 1.2 x 0.6 cm. Unchanged 0.5 cm RUL nodule CT Chest 10/24/23 - Decreased interval size of right lower lobe nodule. Increase in RUL nodule 5x 8 mm, mildly increased. Centrilobular emphysema. CT Chest 05/06/24 - Unchanged 8 mm stable  PFT: Spirometry 04/19/22 FVC 1.38 (33%) FEV1 0.72 (24%) Ratio 52   Interpretation: Very severe obstructive defect  Labs: CBC    Component Value Date/Time   WBC 6.7 12/30/2021 1454   WBC 7.6 06/10/2021 0207   RBC 5.20 12/30/2021 1454   RBC 3.27 (L) 06/10/2021 0207   HGB 13.5 12/30/2021 1454   HCT 42.9 12/30/2021 1454   PLT 198 06/10/2021 0207   MCV 83 12/30/2021 1454   MCH 26.0 (L) 12/30/2021 1454   MCH 30.6 06/10/2021 0207   MCHC 31.5 12/30/2021 1454   MCHC 33.3 06/10/2021 0207   RDW 15.4 12/30/2021 1454   LYMPHSABS 1.5 12/30/2021 1454   MONOABS 0.6 06/05/2021 0321  EOSABS 0.4 12/30/2021 1454   BASOSABS 0.1 12/30/2021 1454   Absolute eosinophils  06/05/2021-300 12/30/2021 - 400  ABG    Component Value Date/Time   PHART 7.325 (L) 06/08/2021 2200   PCO2ART 81.4 (HH) 06/08/2021 2200   PO2ART 105 06/08/2021 2200   HCO3 37.8 (H) 06/10/2021 1654   O2SAT 88.0 06/10/2021 1654   Interpretation: Acute on chronic hypercarbic respiratory failure  CPAP Compliance 05/01/23-05/30/23 Usage 29/30 days (97%) >4 hours 29 days (97%) Avg hours used 6 hours 3 min BiPAP 10/5 AHI 0.9 Leaks Max 7.1  Ambulatory O2  Wed Apr 10, 2024  2:22 PM   POC SATURATION QUALIFICATIONS: (This note is used to comply with regulatory documentation for home oxygen )   Patient Saturations on Room Air at Rest =95 %    Patient Saturations on Room Air while Ambulating = 88%   Patient Saturations on 2 Liters of pulsed oxygen  while Ambulating =95 %   Please briefly explain why patient needs home oxygen : to maintain sats above 90 %  Assessment & Plan:   Discussion: 78 year old male with RLL and RUL pulmonary nodules, severe emphysema, chronic hypercarbic respiratory failure on BiPAP for COPD who presents for follow-up. Worsening COPD symptoms including mucous production. Compliant with LAMA/LABA with ICS neb. Discussed risks and benefits of chronic macrolide. Suspect his COPD is progressing. Defer PFTs as this does not change management.  COPD, unspecified - worsening bronchitis --CONTINUE budesonide  nebulizer 0.5 mg/2ml in the morning and evening. Consider using albuterol  beforehand --CONTINUE Albuterol  nebulizer up to twice a day AS NEEDED for mucous clearance --CONTINUE Stiolto 2.5 TWO puffs ONCE daily.  --CONTINUE Albuterol  AS NEEDED for shortness of breath or wheezing --START azithromycin  250 mg daily. Will need EKG at next visit to check QTC  Chronic hypercarbic respiratory failure secondary to COPD --CONTINUE oxygen  at 2L pulsed with activity --CONTINUE BiPAP with nightly oxygen  --DME: [facility] --Patient is considering switching to [facility]. Will let office know if insurance approves  Addendum: Ok to transition to [facility] per patient. Will send order for pulsed POC to [facility]  Right lower lobe nodule - continue to improve in size Right upper lobe nodule - slightly increased in size. Continue surveillance --Reviewed CT chest in 04/2024 with Radiation Oncology --Next scan due 10/2024  Tobacco cessation --Quit smoking July 2022  Health Maintenance Immunization History  Administered Date(s) Administered   Fluad Quad(high Dose 65+) 10/11/2021, 08/04/2022   Influenza-Unspecified 09/14/2008   PFIZER Comirnaty(Gray Top)Covid-19 Tri-Sucrose Vaccine 06/06/2021   Zoster Recombinant(Shingrix) 10/17/2022    CT Lung Screen- Currently followed by Rad Onc  No orders of the defined types were placed in this encounter.  Meds ordered this encounter  Medications   azithromycin  (ZITHROMAX ) 250 MG tablet    Sig: Take 1 tablet (250 mg total) by mouth daily at 2 PM.    Dispense:  30 tablet    Refill:  5   Return in about 3 months (around 10/31/2024) for November/December.   I have spent a total time of 31-minutes on the day of the appointment including chart review, data review, collecting history, coordinating care and discussing medical diagnosis and plan with the patient/family. Past medical history, allergies, medications were reviewed. Pertinent imaging, labs and tests included in this note have been reviewed and interpreted independently by me.  Mariko Nowakowski Slater Staff, MD [facility]

## 2024-08-02 ENCOUNTER — Telehealth (HOSPITAL_BASED_OUTPATIENT_CLINIC_OR_DEPARTMENT_OTHER): Payer: Self-pay

## 2024-08-02 DIAGNOSIS — J449 Chronic obstructive pulmonary disease, unspecified: Secondary | ICD-10-CM

## 2024-08-02 DIAGNOSIS — J9612 Chronic respiratory failure with hypercapnia: Secondary | ICD-10-CM

## 2024-08-02 NOTE — Telephone Encounter (Signed)
 Copied from CRM [id number]. Topic: General - Other >> Aug 02, 2024 10:51 AM Benton O wrote: Reason for CRM: patient is calling because he need to get a message to dr kassie . need her to call me back its about information concerning my portable oxygen  generator [phone]

## 2024-08-05 NOTE — Telephone Encounter (Signed)
 Order placed to [facility]

## 2024-08-07 ENCOUNTER — Ambulatory Visit (HOSPITAL_BASED_OUTPATIENT_CLINIC_OR_DEPARTMENT_OTHER): Payer: Self-pay

## 2024-08-07 MED ORDER — PREDNISONE 10 MG PO TABS: ORAL_TABLET | ORAL | 0 refills | Status: AC

## 2024-08-07 NOTE — Telephone Encounter (Signed)
 Pt.notified

## 2024-08-07 NOTE — Telephone Encounter (Signed)
 FYI Only or Action Required?: FYI only for provider.  Patient is followed in Pulmonology for COPD, last seen on 08/01/2024 by Kassie Acquanetta Bradley, MD.  Called Nurse Triage reporting No chief complaint on file..  Symptoms began yesterday.  Interventions attempted: Increased fluids/rest.  Symptoms are: gradually worsening.  Triage Disposition: No disposition on file.  Patient/caregiver understands and will follow disposition?:   Copied from CRM [id number]. Topic: Clinical - Red Word Triage >> Aug 07, 2024  8:19 AM Benton KIDD wrote: [facility] that prompted transfer to Nurse Triage: patient is calling because some antibiotics he is taking is giving him problems and he want to speak with the doctor . Yesterday It started I was having difficulty getting my breath . Nausea feeling . And I do have copd  but this felt different  Sees dr kassie Reason for Disposition  Oxygen  level (e.g., pulse oximetry) 91 to 94%  Answer Assessment - Initial Assessment Questions 1. RESPIRATORY STATUS: Describe your breathing? (e.g., wheezing, shortness of breath, unable to speak, severe coughing)      Intermittent Dyspnea  2. ONSET: When did this breathing problem begin?      Yesterday  3. PATTERN Does the difficult breathing come and go, or has it been constant since it started?      Comes and Goes  4. SEVERITY: How bad is your breathing? (e.g., mild, moderate, severe)      Mild to Moderate  5. RECURRENT SYMPTOM: Have you had difficulty breathing before? If Yes, ask: When was the last time? and What happened that time?      Hx of COPD  6. CARDIAC HISTORY: Do you have any history of heart disease? (e.g., heart attack, angina, bypass surgery, angioplasty)      Previous CVA  7. LUNG HISTORY: Do you have any history of lung disease?  (e.g., pulmonary embolus, asthma, emphysema)     COPD  8. CAUSE: What do you think is causing the breathing problem?      Unsure  9. OTHER SYMPTOMS: Do  you have any other symptoms? (e.g., chest pain, cough, dizziness, fever, runny nose)     Nausea  10. O2 SATURATION MONITOR:  Do you use an oxygen  saturation monitor (pulse oximeter) at home? If Yes, ask: What is your reading (oxygen  level) today? What is your usual oxygen  saturation reading? (e.g., 95%)       90-91, Mid to Upper 80s with exertion, the patient states his average is 91-93. Usually wears 2L of oxygen .   12. TRAVEL: Have you traveled out of the country in the last month? (e.g., travel history, exposures)       Denies  The patient states his only change is Azithromycin .  Protocols used: Breathing Difficulty-A-AH  Patient asks for Dr. Kassie to give him a call at [phone]

## 2024-08-07 NOTE — Telephone Encounter (Signed)
 Please call patient with the following message:  \Would not expect azithromycin  course after 6 days to affect his breathing this way.  To be cautious, please ask patient to hold for now  Will prescribe prednisone  taper for exacerbation of his bronchitis We can retry the azithromycin  again when he is starting to feel better

## 2024-08-07 NOTE — Telephone Encounter (Signed)
 Spoke with pt he started the antibiotic on Friday and was fine until yesterday morning. He is doing his nebulizer as prescribed. While he was in the dentist chair he was nauseous and feeling hot-having a hard time breathing Pt states 92% while at rest on no 02.

## 2024-08-08 ENCOUNTER — Telehealth (HOSPITAL_BASED_OUTPATIENT_CLINIC_OR_DEPARTMENT_OTHER): Payer: Self-pay

## 2024-08-08 NOTE — Telephone Encounter (Signed)
 Copied from CRM [id number]. Topic: Clinical - Medication Question >> Aug 08, 2024  1:03 PM Leila C wrote: Reason for CRM: Patient [phone] wants to speak with Dr. Laymond nurse regarding Prednisone , just picked it from the pharmacy and has some concerns about it. Please call back.

## 2024-08-09 NOTE — Telephone Encounter (Signed)
 If he is not having respiratory symptoms, he does not need to take it

## 2024-08-09 NOTE — Telephone Encounter (Signed)
 Pt.notified

## 2024-08-19 ENCOUNTER — Telehealth: Payer: Self-pay

## 2024-08-19 NOTE — Telephone Encounter (Signed)
 CMN received for oxygen  from [facility] signed by provider and faxed confirmation received

## 2024-08-20 ENCOUNTER — Ambulatory Visit: Payer: Medicare PPO | Admitting: Physician Assistant

## 2024-08-20 ENCOUNTER — Encounter: Payer: Self-pay | Admitting: Physician Assistant

## 2024-08-20 DIAGNOSIS — F411 Generalized anxiety disorder: Secondary | ICD-10-CM | POA: Diagnosis not present

## 2024-08-20 DIAGNOSIS — F3342 Major depressive disorder, recurrent, in full remission: Secondary | ICD-10-CM | POA: Diagnosis not present

## 2024-08-20 MED ORDER — PROPRANOLOL HCL 10 MG PO TABS: 10.0000 mg | ORAL_TABLET | Freq: Every day | ORAL | 0 refills | Status: DC | PRN

## 2024-08-20 MED ORDER — CITALOPRAM HYDROBROMIDE 20 MG PO TABS: 20.0000 mg | ORAL_TABLET | Freq: Every morning | ORAL | 3 refills | Status: AC

## 2024-08-20 NOTE — Progress Notes (Signed)
 [facility] Check  Patient ID: Martin Mendez,  MRN: [id number]  PCP: Ransom Other, MD  Date of Evaluation: 08/20/2024 Time spent:20 minutes  Chief Complaint:  Chief Complaint   Anxiety; Depression; Follow-up    HISTORY/CURRENT STATUS: For routine annual med check.  All in all, I'm doing pretty good. Feels like the Celexa  and Inderal  working as they should.  Energy and motivation are good most of the time. He has COPD which keeps him from doing anything strenuous, but he goes slow and does the things he needs to.  No extreme sadness, tearfulness, or feelings of hopelessness.  Sleeps ok.  ADLs and personal hygiene are normal.   Denies any changes in concentration, making decisions, or remembering things.  Appetite has not changed.  Weight is stable.  Anxiety is controlled.  He needs the Propranolol  occas which helps.  No mania, delirium, AH/VH.  No SI/HI.  Individual Medical History/ Review of Systems: Changes? :No      Past medications for mental health diagnoses include: unknown  Allergies: Penicillins, Dupixent  [dupilumab ], and Sulfa antibiotics  Current Medications:  Current Outpatient Medications:    acetaminophen  (TYLENOL ) 500 MG tablet, Take 500 mg by mouth as needed., Disp: , Rfl:    albuterol  (ACCUNEB ) 0.63 MG/3ML nebulizer solution, TAKE 3 MLS (0.63 MG TOTAL) BY NEBULIZATION 2 (TWO) TIMES DAILY., Disp: 150 mL, Rfl: 11   albuterol  (VENTOLIN  HFA) 108 (90 Base) MCG/ACT inhaler, Inhale 2 puffs into the lungs every 6 (six) hours as needed for wheezing or shortness of breath., Disp: 18 each, Rfl: 2   budesonide  (PULMICORT ) 0.5 MG/2ML nebulizer solution, Take 2 mLs (0.5 mg total) by nebulization 2 (two) times daily., Disp: 120 mL, Rfl: 5   citalopram  (CELEXA ) 20 MG tablet, Take 1 tablet (20 mg total) by mouth every morning., Disp: 90 tablet, Rfl: 3   dextromethorphan-guaiFENesin  (MUCINEX  DM) 30-600 MG 12hr tablet, Take 1 tablet by mouth 2 (two) times daily as needed for  cough., Disp: , Rfl:    faricimab -svoa (VABYSMO ) 6 MG/0.05ML SOLN intravitreal injection, 6 mg by Intravitreal route once., Disp: , Rfl:    Multiple Vitamin (MULTI VITAMIN DAILY PO), Take by mouth., Disp: , Rfl:    Multiple Vitamins-Minerals (PRESERVISION AREDS 2 PO), Take 1 capsule by mouth daily., Disp: , Rfl:    propranolol  (INDERAL ) 10 MG tablet, Take 1 tablet (10 mg total) by mouth daily as needed., Disp: 30 tablet, Rfl: 0   Tiotropium Bromide-Olodaterol (STIOLTO RESPIMAT ) 2.5-2.5 MCG/ACT AERS, Inhale 2 puffs into the lungs daily., Disp: 4 g, Rfl: 11   azithromycin  (ZITHROMAX ) 250 MG tablet, Take 1 tablet (250 mg total) by mouth daily at 2 PM., Disp: 30 tablet, Rfl: 5 Medication Side Effects: none  Family Medical/ Social History: Changes?  None  MENTAL HEALTH EXAM:  There were no vitals taken for this visit.There is no height or weight on file to calculate BMI.  General Appearance: Casual and Well Groomed  Eye Contact:  Good  Speech:  Clear and Coherent and Normal Rate  Volume:  Normal  Mood:  Euthymic  Affect:  Congruent  Thought Process:  Goal Directed and Descriptions of Associations: Circumstantial  Orientation:  Full (Time, Place, and Person)  Thought Content: Logical   Suicidal Thoughts:  No  Homicidal Thoughts:  No  Memory:  WNL  Judgement:  Good  Insight:  Good  Psychomotor Activity:  Normal  Concentration:  Concentration: Good and Attention Span: Good  Recall:  Good  Fund of Knowledge: Good  Language: Good  Assets:  Communication Skills Desire for Improvement Financial Resources/Insurance Housing Transportation  ADL's:  Intact  Cognition: WNL  Prognosis:  Good   DIAGNOSES:    ICD-10-CM   1. Generalized anxiety disorder  F41.1     2. Recurrent major depressive disorder, in full remission  F33.42      Receiving Psychotherapy: No   RECOMMENDATIONS:  PDMP reviewed.  No controlled substances. I provided approximately  20 minutes of face to face time  during this encounter, including time spent before and after the visit in records review, medical decision making, counseling pertinent to today's visit, and charting.   He's still doing well so no changes are needed.   Continue Celexa  20 mg qd. Continue Propranolol  10 mg qid prn anxiety.  He take it rarely. Return in 1 year. In office.   Verneita Cooks, PA-C

## 2024-08-29 ENCOUNTER — Other Ambulatory Visit: Payer: Self-pay | Admitting: Pulmonary Disease

## 2024-08-29 DIAGNOSIS — H353221 Exudative age-related macular degeneration, left eye, with active choroidal neovascularization: Secondary | ICD-10-CM | POA: Diagnosis not present

## 2024-08-29 DIAGNOSIS — H43813 Vitreous degeneration, bilateral: Secondary | ICD-10-CM | POA: Diagnosis not present

## 2024-08-29 DIAGNOSIS — H35371 Puckering of macula, right eye: Secondary | ICD-10-CM | POA: Diagnosis not present

## 2024-08-29 DIAGNOSIS — H353111 Nonexudative age-related macular degeneration, right eye, early dry stage: Secondary | ICD-10-CM | POA: Diagnosis not present

## 2024-08-29 DIAGNOSIS — H2513 Age-related nuclear cataract, bilateral: Secondary | ICD-10-CM | POA: Diagnosis not present

## 2024-09-02 ENCOUNTER — Other Ambulatory Visit (HOSPITAL_BASED_OUTPATIENT_CLINIC_OR_DEPARTMENT_OTHER): Payer: Self-pay

## 2024-09-02 NOTE — Telephone Encounter (Signed)
 Copied from CRM #[id number]. Topic: Clinical - Order For Equipment >> Aug 29, 2024  2:27 PM Russell PARAS wrote: Reason for CRM:   Grayce with [employer], is contacting clinic regarding request she sent over on 9/29 for pt's oxygen  supplies. Advised the form was signed/dated and was faxed back on 10/1. Sonya requested if request could be refaxed to them, it is not showing in their system  FX#  [phone]  CB#  [phone]  [name], Do you still have this fax?  If so, can you please resend?  Thank you.

## 2024-09-02 NOTE — Telephone Encounter (Signed)
 Fax sent to [employer] again with confirmation recevied

## 2024-09-03 ENCOUNTER — Ambulatory Visit (HOSPITAL_BASED_OUTPATIENT_CLINIC_OR_DEPARTMENT_OTHER): Payer: Self-pay | Admitting: Pulmonary Disease

## 2024-09-03 NOTE — Telephone Encounter (Signed)
 FYI: duplicate. Patient has spoken with [name] and has no further questions or needs at this time.   Copied from CRM #[id number]. Topic: Clinical - Red Word Triage >> Sep 03, 2024 11:04 AM Martin Mendez wrote: Red Word that prompted transfer to Nurse Triage: patient is experiencing SOB, states he is used to having SOB due to COPD but states this feels different, has been ongoing for the last few days Reason for Disposition  Caller has already spoken with another triager and has no further questions.  Answer Assessment - Initial Assessment Questions Called patient to triage for shortness of breath. He stated he just got off the phone with [name] from the office and has no further needs at this time. He will call back with any questions or concerns.  Protocols used: No Contact or Duplicate Contact Call-Mendez-AH

## 2024-09-05 ENCOUNTER — Encounter (HOSPITAL_COMMUNITY): Payer: Self-pay

## 2024-09-05 ENCOUNTER — Emergency Department (HOSPITAL_COMMUNITY)
Admission: EM | Admit: 2024-09-05 | Discharge: 2024-09-05 | Disposition: A | Discharge status: Home / Self Care | Attending: Emergency Medicine | Admitting: Emergency Medicine

## 2024-09-05 ENCOUNTER — Emergency Department (HOSPITAL_COMMUNITY)

## 2024-09-05 ENCOUNTER — Ambulatory Visit: Payer: Self-pay | Admitting: Internal Medicine

## 2024-09-05 DIAGNOSIS — R0602 Shortness of breath: Secondary | ICD-10-CM

## 2024-09-05 DIAGNOSIS — Z7951 Long term (current) use of inhaled steroids: Secondary | ICD-10-CM | POA: Insufficient documentation

## 2024-09-05 DIAGNOSIS — J441 Chronic obstructive pulmonary disease with (acute) exacerbation: Secondary | ICD-10-CM | POA: Diagnosis not present

## 2024-09-05 DIAGNOSIS — R079 Chest pain, unspecified: Secondary | ICD-10-CM | POA: Diagnosis not present

## 2024-09-05 LAB — CBC WITH DIFFERENTIAL/PLATELET
Abs Immature Granulocytes: 0.02 K/uL (ref 0.00–0.07)
Basophils Absolute: 0 K/uL (ref 0.0–0.1)
Basophils Relative: 1 %
Eosinophils Absolute: 0.3 K/uL (ref 0.0–0.5)
Eosinophils Relative: 4 %
HCT: 45.6 % (ref 39.0–52.0)
Hemoglobin: 14.7 g/dL (ref 13.0–17.0)
Immature Granulocytes: 0 %
Lymphocytes Relative: 16 %
Lymphs Abs: 1.2 K/uL (ref 0.7–4.0)
MCH: 29 pg (ref 26.0–34.0)
MCHC: 32.2 g/dL (ref 30.0–36.0)
MCV: 89.9 fL (ref 80.0–100.0)
Monocytes Absolute: 0.6 K/uL (ref 0.1–1.0)
Monocytes Relative: 8 %
Neutro Abs: 5.6 K/uL (ref 1.7–7.7)
Neutrophils Relative %: 71 %
Platelets: 294 K/uL (ref 150–400)
RBC: 5.07 MIL/uL (ref 4.22–5.81)
RDW: 13 % (ref 11.5–15.5)
WBC: 7.9 K/uL (ref 4.0–10.5)
nRBC: 0 % (ref 0.0–0.2)

## 2024-09-05 LAB — BASIC METABOLIC PANEL WITH GFR
Anion gap: 12 (ref 5–15)
BUN: 8 mg/dL (ref 8–23)
CO2: 26 mmol/L (ref 22–32)
Calcium: 8.8 mg/dL — ABNORMAL LOW (ref 8.9–10.3)
Chloride: 97 mmol/L — ABNORMAL LOW (ref 98–111)
Creatinine, Ser: 0.75 mg/dL (ref 0.61–1.24)
GFR, Estimated: >60 mL/min (ref >60)
Glucose, Bld: 102 mg/dL — ABNORMAL HIGH (ref 70–99)
Potassium: 4.1 mmol/L (ref 3.5–5.1)
Sodium: 135 mmol/L (ref 135–145)

## 2024-09-05 LAB — D-DIMER, QUANTITATIVE: D-Dimer, Quant: 0.35 ug{FEU}/mL (ref 0.00–0.50)

## 2024-09-05 LAB — I-STAT VENOUS BLOOD GAS, ED
Acid-Base Excess: 8 mmol/L — ABNORMAL HIGH (ref 0.0–2.0)
Bicarbonate: 32.8 mmol/L — ABNORMAL HIGH (ref 20.0–28.0)
Calcium, Ion: 1.05 mmol/L — ABNORMAL LOW (ref 1.15–1.40)
HCT: 45 % (ref 39.0–52.0)
Hemoglobin: 15.3 g/dL (ref 13.0–17.0)
O2 Saturation: 94 %
Potassium: 6.9 mmol/L (ref 3.5–5.1)
Sodium: 135 mmol/L (ref 135–145)
TCO2: 34 mmol/L — ABNORMAL HIGH (ref 22–32)
pCO2, Ven: 46.3 mmHg (ref 44–60)
pH, Ven: 7.458 — ABNORMAL HIGH (ref 7.25–7.43)
pO2, Ven: 70 mmHg — ABNORMAL HIGH (ref 32–45)

## 2024-09-05 LAB — TROPONIN I (HIGH SENSITIVITY)
Troponin I (High Sensitivity): 24 ng/L — ABNORMAL HIGH (ref <18)
Troponin I (High Sensitivity): 25 ng/L — ABNORMAL HIGH (ref <18)

## 2024-09-05 LAB — RESP PANEL BY RT-PCR (RSV, FLU A&B, COVID)  RVPGX2
Influenza A by PCR: NEGATIVE
Influenza B by PCR: NEGATIVE
Resp Syncytial Virus by PCR: NEGATIVE
SARS Coronavirus 2 by RT PCR: NEGATIVE

## 2024-09-05 MED ORDER — IPRATROPIUM-ALBUTEROL 0.5-2.5 (3) MG/3ML IN SOLN
3.0000 mL | Freq: Once | RESPIRATORY_TRACT | Status: AC
  Administered 2024-09-05: 3 mL via RESPIRATORY_TRACT
  Filled 2024-09-05: qty 3

## 2024-09-05 MED ORDER — PREDNISONE 10 MG PO TABS: 40.0000 mg | ORAL_TABLET | Freq: Every day | ORAL | 0 refills | Status: AC

## 2024-09-05 MED ORDER — AZITHROMYCIN 500 MG IV SOLR
500.0000 mg | Freq: Once | INTRAVENOUS | Status: AC
  Administered 2024-09-05: 500 mg via INTRAVENOUS
  Filled 2024-09-05: qty 5

## 2024-09-05 MED ORDER — METHYLPREDNISOLONE SODIUM SUCC 125 MG IJ SOLR
125.0000 mg | Freq: Once | INTRAMUSCULAR | Status: AC
  Administered 2024-09-05: 125 mg via INTRAVENOUS
  Filled 2024-09-05: qty 2

## 2024-09-05 NOTE — ED Notes (Signed)
 X-ray at bedside.

## 2024-09-05 NOTE — ED Provider Notes (Deleted)
 Patient is a 78 year old male with a history of COPD on 2 L nasal cannula and prior history of duodenal ulcer who is presenting today with 3 days of worsening chest tightness and reports he feels like he cannot cough anything up and was concerned he may be getting pneumonia.  Denies any fever or URI type symptoms.  Has not had any swelling in his legs and no prior history of clot.  Patient has breath sounds throughout but some minimal crackles in the left base after an albuterol  treatment.  Sats are 99% on his home 2 L.   Doretha Folks, MD 09/16/24 1245

## 2024-09-05 NOTE — ED Provider Notes (Signed)
 [place] EMERGENCY DEPARTMENT AT [facility] Provider Note  MDM   HPI/ROS:  Martin Mendez is a 78 y.o. male with a PMH of COPD on 2 L as needed, duodenal ulcer who presents to the ED for chest tightness.  Patient was BIBEMS with complaints of chest tightness for the last 3 days.  He states that at baseline he does not feel more short of breath but has difficulty with deep breaths given chest tightness.  Denies any radiation to his back or arms, no associated diaphoresis, no hemoptysis, denies palpitations.  On my initial evaluation, patient is:  -Vital signs stable. Patient afebrile, hemodynamically stable, and non-toxic appearing.  Physical exam is notable for: - No increased work of breathing, speaking in full sentences, saturating well on home 2 L, no obvious wheezing or crackles, good air movement bilaterally.  DDx includes but is not limited to COPD exacerbation, pneumonia, pneumothorax, PE, ACS  On VBG i-STAT, pH 7.548/46.3/32.8.  I-STAT potassium of 6.9.  Patient's EKG is nonischemic, no peaked T waves, sinus rhythm with a RBBB.  BMP potassium is 4.1, believe that initial is a lab error.  CBC without medically relevant abnormalities, no evidence of leukocytosis.  D-dimer negative at 0.35.  CXR without any acute cardiopulmonary process. Interpretations, interventions, and the patient's course of care are documented below.   Patient given DuoNeb with some improvement of air movement, now can hear some RLL crackles.  Patient still without wheezing or increased work of breathing.  Initial troponin of 24, patient's baseline around 6, will obtain a delta troponin.  Patient given Solu-Medrol, azithromycin  for presumed COPD attack.  Clinical Course as of 09/06/24 0806  Thu Sep 05, 2024  1542 S. 79 COPD exacerbation to be d/c [LB]    Clinical Course User Index [LB] Sharlet Dowdy, MD     Disposition:  I discussed the plan for discharge with the patient and/or their surrogate at  bedside prior to discharge and they were in agreement with the plan and verbalized understanding of the return precautions provided. All questions answered to the best of my ability. Ultimately, the patient was discharged in stable condition with stable vital signs. I am reassured that they are capable of close follow up and good social support at home.   Clinical Impression:  1. COPD exacerbation (HCC)   2. SOB (shortness of breath)     Rx / DC Orders ED Discharge Orders          Ordered    predniSONE  (DELTASONE ) 10 MG tablet  Daily        09/05/24 1550            Clinical Complexity A medically appropriate history, review of systems, and physical exam was performed.  My independent interpretations of EKG, labs, and radiology are documented in the ED course above.   If decision rules were used in this patient's evaluation, they are listed below.   Click here for ABCD2, HEART and other calculatorsREFRESH Note before signing   Patient's presentation is most consistent with acute illness / injury with system symptoms.  Medical Decision Making Amount and/or Complexity of Data Reviewed Labs: ordered. Radiology: ordered.  Risk Prescription drug management.    HPI/ROS      See MDM section for pertinent HPI and ROS. A complete ROS was performed with pertinent positives/negatives noted above.   Past Medical History:  Diagnosis Date   COPD (chronic obstructive pulmonary disease) (HCC)    History of radiation therapy  Right Lung- 11/24/22-12/01/22- Dr. Lynwood Nasuti   PTSD (post-traumatic stress disorder)     Past Surgical History:  Procedure Laterality Date   ESOPHAGOGASTRODUODENOSCOPY (EGD) WITH PROPOFOL  N/A 06/05/2021   Procedure: ESOPHAGOGASTRODUODENOSCOPY (EGD) WITH PROPOFOL ;  Surgeon: Burnette Fallow, MD;  Location: [facility] ENDOSCOPY;  Service: Endoscopy;  Laterality: N/A;   IR ANGIOGRAM FOLLOW UP STUDY  06/07/2021   IR ANGIOGRAM SELECTIVE EACH ADDITIONAL VESSEL   06/07/2021   IR ANGIOGRAM VISCERAL SELECTIVE  06/07/2021   IR EMBO ART  VEN HEMORR LYMPH EXTRAV  INC GUIDE ROADMAPPING  06/07/2021   IR US  GUIDE VASC ACCESS RIGHT  06/07/2021      Physical Exam   Vitals:   09/05/24 1154  BP: 119/73  Pulse: 75  Resp: 20  Temp: 98.8 F (37.1 C)  TempSrc: Oral  SpO2: 100%    Physical Exam Vitals and nursing note reviewed.  Constitutional:      General: He is not in acute distress.    Appearance: He is well-developed.  HENT:     Head: Normocephalic and atraumatic.  Eyes:     Conjunctiva/sclera: Conjunctivae normal.  Cardiovascular:     Rate and Rhythm: Normal rate and regular rhythm.     Heart sounds: No murmur heard. Pulmonary:     Comments: - No increased work of breathing, speaking in full sentences, saturating well on home 2 L, no obvious wheezing or crackles, good air movement bilaterally. Abdominal:     Palpations: Abdomen is soft.     Tenderness: There is no abdominal tenderness.  Musculoskeletal:        General: No swelling.     Cervical back: Neck supple.  Skin:    General: Skin is warm and dry.     Capillary Refill: Capillary refill takes less than 2 seconds.  Neurological:     Mental Status: He is alert.  Psychiatric:        Mood and Affect: Mood normal.      Procedures   If procedures were preformed on this patient, they are listed below:  Procedures   Please note that this documentation was produced with the assistance of voice-to-text technology and may contain errors.     Billy Pal, MD 09/06/24 9191    Doretha Folks, MD 09/16/24 1237

## 2024-09-05 NOTE — ED Triage Notes (Addendum)
 Pt BIB [facility] with c/o SHOB with tightness in his chest, ongoing for 3 days, hx of COPD, 2 L O2 at baseline. Spoke with DR today advised to come to hospital for concerns of pneumonia.   126/78 Hr 80 92% 2 L O2  CBG 11

## 2024-09-05 NOTE — Telephone Encounter (Signed)
 FYI Only or Action Required?: FYI only for provider.  Patient is followed in Pulmonology for COPD, last seen on 08/01/2024 by Kassie Acquanetta Bradley, MD.  Called Nurse Triage reporting Shortness of Breath.  Symptoms began several days ago.  Interventions attempted: Prescription medications: albuterol , budesonide .  Symptoms are: gradually worsening.  Triage Disposition: Go to ED Now (Notify PCP)  Patient/caregiver understands and will follow disposition?: Yes           Copied from CRM [id number]. Topic: Clinical - Red Word Triage >> Sep 05, 2024  9:02 AM Nathanel DEL wrote: Red Word that prompted transfer to Nurse Triage: pt having trouble breathing.   3 days now, feels like fluid building up Reason for Disposition  [1] MODERATE difficulty breathing (e.g., speaks in phrases, SOB even at rest, pulse 100-120) AND [2] NEW-onset or WORSE than normal  Answer Assessment - Initial Assessment Questions This RN recommends pt goes to the ED. Pt is agreeable and states he will call EMS now.  Symptoms:  Difficulty breathing; chest feels tight constantly (not painful)  Shortness of breath worsens with movement but also present at rest  Using nebulizer treatments with albuterol  and budesonide  without relief (not breaking it up)  Onset: 3 days ago  No cough or sputum production  Runny nose after treatments  Denies fever or dizziness  O2 saturation: 93-96% on 2 L of oxygen   Protocols used: Breathing Difficulty-A-AH

## 2024-09-05 NOTE — Discharge Instructions (Signed)
 Martin Mendez:  Thank you for allowing us  to take care of you today.  We hope you begin feeling better soon. You were seen today for chest tightness and shortness of breath.  While you are here we performed lab work, imaging and determined that there was no emergent cause of your symptoms today.  You were treated for COPD exacerbation.  You are being sent home with 5 days of prednisone .   To-Do:  Please follow-up with your primary doctor within the next 2-3 days. It is important that you review any labs or imaging results (if any) that you had today with them. Your preliminary imaging results (if any) are attached. Please return to the Emergency Department or call 911 if you experience chest pain, shortness of breath, severe pain, severe fever, altered mental status, or have any reason to think that you need emergency medical care.  Thank you again.  Hope you feel better soon.  Department of Emergency Medicine

## 2024-09-05 NOTE — ED Notes (Signed)
 CCMD called.

## 2024-09-05 NOTE — Telephone Encounter (Signed)
 Pt is going to ED at [facility] I just spoke with him he will reach out if he needs us 

## 2024-09-22 ENCOUNTER — Other Ambulatory Visit (HOSPITAL_BASED_OUTPATIENT_CLINIC_OR_DEPARTMENT_OTHER): Payer: Self-pay | Admitting: Primary Care

## 2024-10-14 DIAGNOSIS — Z8673 Personal history of transient ischemic attack (TIA), and cerebral infarction without residual deficits: Secondary | ICD-10-CM | POA: Diagnosis not present

## 2024-10-14 DIAGNOSIS — R7303 Prediabetes: Secondary | ICD-10-CM | POA: Diagnosis not present

## 2024-10-14 DIAGNOSIS — L509 Urticaria, unspecified: Secondary | ICD-10-CM | POA: Diagnosis not present

## 2024-10-14 DIAGNOSIS — Z8719 Personal history of other diseases of the digestive system: Secondary | ICD-10-CM | POA: Diagnosis not present

## 2024-10-14 DIAGNOSIS — R911 Solitary pulmonary nodule: Secondary | ICD-10-CM | POA: Diagnosis not present

## 2024-10-14 DIAGNOSIS — I700 Atherosclerosis of aorta: Secondary | ICD-10-CM | POA: Diagnosis not present

## 2024-10-14 DIAGNOSIS — R972 Elevated prostate specific antigen [PSA]: Secondary | ICD-10-CM | POA: Diagnosis not present

## 2024-10-14 DIAGNOSIS — F4310 Post-traumatic stress disorder, unspecified: Secondary | ICD-10-CM | POA: Diagnosis not present

## 2024-10-14 DIAGNOSIS — Z0000 Encounter for general adult medical examination without abnormal findings: Secondary | ICD-10-CM | POA: Diagnosis not present

## 2024-10-14 DIAGNOSIS — J9612 Chronic respiratory failure with hypercapnia: Secondary | ICD-10-CM | POA: Diagnosis not present

## 2024-10-26 ENCOUNTER — Other Ambulatory Visit (HOSPITAL_BASED_OUTPATIENT_CLINIC_OR_DEPARTMENT_OTHER): Payer: Self-pay | Admitting: Pulmonary Disease

## 2024-10-28 ENCOUNTER — Encounter (HOSPITAL_BASED_OUTPATIENT_CLINIC_OR_DEPARTMENT_OTHER): Payer: Self-pay | Admitting: Pulmonary Disease

## 2024-10-28 ENCOUNTER — Other Ambulatory Visit (HOSPITAL_BASED_OUTPATIENT_CLINIC_OR_DEPARTMENT_OTHER): Payer: Self-pay

## 2024-10-28 ENCOUNTER — Ambulatory Visit (HOSPITAL_BASED_OUTPATIENT_CLINIC_OR_DEPARTMENT_OTHER): Admitting: Pulmonary Disease

## 2024-10-28 VITALS — BP 125/79 | HR 76 | Temp 97.9°F | Ht 70.0 in | Wt 180.8 lb

## 2024-10-28 DIAGNOSIS — Z87891 Personal history of nicotine dependence: Secondary | ICD-10-CM

## 2024-10-28 DIAGNOSIS — J449 Chronic obstructive pulmonary disease, unspecified: Secondary | ICD-10-CM

## 2024-10-28 DIAGNOSIS — R918 Other nonspecific abnormal finding of lung field: Secondary | ICD-10-CM

## 2024-10-28 DIAGNOSIS — J9612 Chronic respiratory failure with hypercapnia: Secondary | ICD-10-CM

## 2024-10-28 MED ORDER — IPRATROPIUM-ALBUTEROL 0.5-2.5 (3) MG/3ML IN SOLN: 3.0000 mL | Freq: Four times a day (QID) | RESPIRATORY_TRACT | 5 refills | Status: AC | PRN

## 2024-10-28 MED ORDER — BUDESONIDE 0.5 MG/2ML IN SUSP
0.5000 mg | Freq: Two times a day (BID) | RESPIRATORY_TRACT | 5 refills | Status: DC
  Filled 2024-10-28: qty 120, 30d supply, fill #0
  Filled 2024-11-27: qty 120, 30d supply, fill #1
  Filled 2024-11-28: qty 120, 30d supply, fill #0

## 2024-10-28 NOTE — Progress Notes (Signed)
 Subjective:   PATIENT ID: Martin Mendez Bring GENDER: male DOB: [birth year], MRN: [id number]   HPI  Chief Complaint  Patient presents with   COPD    Reason for Visit: Follow-up  [name] is a 78 year old male former smoker with emphysema and PTSD who presents for follow-up  Synopsis: He was admitted from 06/05/2021 to 06/09/2021 for hematemesis.  EGD on 06/05/2021 demonstrated gastritis, duodenitis and nonbleeding duodenal ulcer.  His hospital course was complicated by acute respiratory failure with hypercapnia.  He was treated with noninvasive ventilation with improvement.  Pulmonary was consulted while inpatient and recommended noninvasive ventilation for outpatient management. COPD well-controlled with Symbicort , Spiriva  and BiPAP  12/49/23 Wife present and provides additional support. Since our last visit we have discussed his recent CT and PET imaging concerning for possible slow growing RLL lung lesion concerning for primary lung cancer. He is scheduled with radiation oncology for further discussion and likely plan for radiation if offered. His overall lung health is fair and he has been compliant with BiPAP which improves his breathing. Compliant with bronchodilators. No exacerbations since 05/2021. He reports he is being treated with ARED2 for AMD, which is less likely to contribute to risk of lung cancer.  05/30/23 Wife present and provides additional history. Has completed radiation with improved RLL nodule size on May CT. He is compliant with BiPAP nightly and occasionally wife reports some leaking at night. He reports occasional cough every 3-4 days and once he produces sputum he is fine. Compliant with his inhalers. Uses nebulizer for these episodes. Will have hoarseness by the end of the day. Using BiPAP humidifier.   12/05/23 Since our last visit he reports stable shortness of breath, coughing with good sputum production. No wheezing. Compliant with BiPAP nightly. Compliant  with Symbicort  and Spiriva . Uses 0-3 albuterol  as needed. Uses nebulzer once a week to help facilitate sputum production as mucinex . Denies exacerbations requiring antibiotics or prednisone . Does have mild cold symptoms that resolve quickly. During the day oxygen  level range from 92-97%  03/08/24 Since our last visit we have had to stop the ICS component of his inhaler due to thrush/hoarsness. Was switched to Stiolto on 02/06/24 due to shortness of breath. But this shortness of breath preceded him being on triple therapy so worried about progression of his COPD. He also reports concerns for borderline low oxygen  saturations. Has started budesonide  and reports initial chest tightness but improves after a minute.  08/01/24 Since our last visit overall doing well on Stiolto and albuterol  neb followed by budesonide  neb twice a day. He continues to have mucous production with chest congestion. He is using his albuterol  inhaler more often. He reports his POC is not working appropriately and currently on his 3rd device.   10/28/24 He was seen in the ED on 09/05/24 with chest tightness and treated for COPD exacerbation. His wife was recently discharged for a heart attack requiring stent. Both husband and wife have been home with no further issues. On our last visit we were on daily azithromycin  and he completed ~20 days with overall no improvement. Currently his symptoms are at baseline with shortness of breath, sputum production and chest congestion that improves with nebulizers. He is compliant with budesonide  and albuterol  and Stiolto. He is compliant with continuous nightly oxygen  and portable as needed. He did not like the stationary one that was sent by [employer] so returned to [employer].  Prior inhalers: Incruse - prefers Spiriva   Social History: Former smoker  Past Medical History:  Diagnosis Date   COPD (chronic obstructive pulmonary disease) (HCC)    History of radiation therapy    Right Lung-  11/24/22-12/01/22- Dr. Lynwood Nasuti   PTSD (post-traumatic stress disorder)     Outpatient Medications Prior to Visit  Medication Sig Dispense Refill   acetaminophen  (TYLENOL ) 500 MG tablet Take 500 mg by mouth as needed.     albuterol  (VENTOLIN  HFA) 108 (90 Base) MCG/ACT inhaler TAKE 2 PUFFS BY MOUTH EVERY 6 HOURS AS NEEDED FOR WHEEZE OR SHORTNESS OF BREATH 18 each 2   budesonide  (PULMICORT ) 0.5 MG/2ML nebulizer solution Take 2 mLs (0.5 mg total) by nebulization 2 (two) times daily. 120 mL 5   citalopram  (CELEXA ) 20 MG tablet Take 1 tablet (20 mg total) by mouth every morning. 90 tablet 3   dextromethorphan-guaiFENesin  (MUCINEX  DM) 30-600 MG 12hr tablet Take 1 tablet by mouth 2 (two) times daily as needed for cough.     faricimab -svoa (VABYSMO ) 6 MG/0.05ML SOLN intravitreal injection 6 mg by Intravitreal route once.     Multiple Vitamin (MULTI VITAMIN DAILY PO) Take by mouth.     Multiple Vitamins-Minerals (PRESERVISION AREDS 2 PO) Take 1 capsule by mouth daily.     propranolol  (INDERAL ) 10 MG tablet Take 1 tablet (10 mg total) by mouth daily as needed. 30 tablet 0   Tiotropium Bromide-Olodaterol (STIOLTO RESPIMAT ) 2.5-2.5 MCG/ACT AERS Inhale 2 puffs into the lungs daily. 4 g 11   albuterol  (ACCUNEB ) 0.63 MG/3ML nebulizer solution INHALE CONTENTS OF 1 VIAL BY NEBULIZATION 2 TIMES DAILY 150 mL 10   No facility-administered medications prior to visit.    Review of Systems  Constitutional:  Negative for chills, diaphoresis, fever, malaise/fatigue and weight loss.  HENT:  Negative for congestion.   Respiratory:  Positive for sputum production. Negative for cough, hemoptysis, shortness of breath and wheezing.   Cardiovascular:  Negative for chest pain, palpitations and leg swelling.     Objective:   Vitals:   10/28/24 1401 10/28/24 1409  BP: 125/79   Pulse: 76   Temp: 97.9 F (36.6 C)   SpO2: (!) 88% 96%  Weight: 180 lb 12.8 oz (82 kg)   Height: 5' 10 (1.778 m)     SpO2: 96 %  (on room air at rest)  Physical Exam: General: Well-appearing, no acute distress HENT: [place], AT Eyes: EOMI, no scleral icterus Respiratory: Clear to auscultation bilaterally.  No crackles, wheezing or rales Cardiovascular: RRR, -M/R/G, no JVD Extremities:-Edema,-tenderness Neuro: AAO x4, CNII-XII grossly intact Psych: Normal mood, normal affect  Data Reviewed:  Imaging: CTA 10/10/2019-moderate centrilobular emphysema.  Right upper lobe 3 mm nodular density, right lower lobe 3 mm nodule.  Left upper lobe subpleural 2 and 3 mm nodule.  Left lower lobe 3 mm nodule.  No aneurysm or dissection.  No acute findings in chest abdomen or pelvis CT Chest Lung Cancer 07/06/22 - Aggressive appearing pulmonary nodule measuring 1.14 cm in superior segment of right lower lobe concerning for neoplasm PET/CT 07/25/22 - 11mm RLL nodule with minimal abnormal FDG avidity CT Chest 10/24/22 - 13 mm. Severe emphysema. Irregular solid RLL nodule 13.3 mm, increased from 11/4 mm. Stable RUL 5mm nodule CT Chest 04/14/23 - Severe emphysema. Slightly diminished size of RLL nodule from 1.4 x 0.9 > 1.2 x 0.6 cm. Unchanged 0.5 cm RUL nodule CT Chest 10/24/23 - Decreased interval size of right lower lobe nodule. Increase in RUL nodule 5x 8 mm, mildly increased. Centrilobular emphysema. CT Chest 05/06/24 -  Unchanged 8 mm stable  PFT: Spirometry 04/19/22 FVC 1.38 (33%) FEV1 0.72 (24%) Ratio 52   Interpretation: Very severe obstructive defect  Labs: CBC    Component Value Date/Time   WBC 7.9 09/05/2024 1213   RBC 5.07 09/05/2024 1213   HGB 15.3 09/05/2024 1221   HGB 13.5 12/30/2021 1454   HCT 45.0 09/05/2024 1221   HCT 42.9 12/30/2021 1454   PLT 294 09/05/2024 1213   MCV 89.9 09/05/2024 1213   MCV 83 12/30/2021 1454   MCH 29.0 09/05/2024 1213   MCHC 32.2 09/05/2024 1213   RDW 13.0 09/05/2024 1213   RDW 15.4 12/30/2021 1454   LYMPHSABS 1.2 09/05/2024 1213   LYMPHSABS 1.5 12/30/2021 1454   MONOABS 0.6 09/05/2024  1213   EOSABS 0.3 09/05/2024 1213   EOSABS 0.4 12/30/2021 1454   BASOSABS 0.0 09/05/2024 1213   BASOSABS 0.1 12/30/2021 1454   Absolute eosinophils  06/05/2021-300 12/30/2021 - 400  ABG    Component Value Date/Time   PHART 7.325 (L) 06/08/2021 2200   PCO2ART 81.4 (HH) 06/08/2021 2200   PO2ART 105 06/08/2021 2200   HCO3 32.8 (H) 09/05/2024 1221   TCO2 34 (H) 09/05/2024 1221   O2SAT 94 09/05/2024 1221   Interpretation: Acute on chronic hypercarbic respiratory failure  CPAP Compliance 05/01/23-05/30/23 Usage 29/30 days (97%) >4 hours 29 days (97%) Avg hours used 6 hours 3 min BiPAP 10/5 AHI 0.9 Leaks Max 7.1  Ambulatory O2  Wed Apr 10, 2024  2:22 PM   POC SATURATION QUALIFICATIONS: (This note is used to comply with regulatory documentation for home oxygen )   Patient Saturations on Room Air at Rest =95 %   Patient Saturations on Room Air while Ambulating = 88%   Patient Saturations on 2 Liters of pulsed oxygen  while Ambulating =95 %   Please briefly explain why patient needs home oxygen : to maintain sats above 90 %  Assessment & Plan:   Discussion: 78 year old male with RLL and RUL pulmonary nodules, severe emphysema, chronic hypercarbic respiratory failure on BiPAP for COPD who presents for follow-up. Worsening COPD symptoms including mucous production. Compliant with LAMA/LABA with ICS neb. Discussed risks and benefits of chronic macrolide. Suspect his COPD is progressing. Defer PFTs as this does not change management.  COPD, unspecified - stable --CONTINUE budesonide  nebulizer 0.5 mg/2ml in the morning and evening. Consider using albuterol  beforehand --START Duonebs (ipratropium-albuterol ) nebulizer up to twice a day AS NEEDED for mucous clearance --CONTINUE Stiolto 2.5 TWO puffs ONCE daily.  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 Continue surveillance --Stable 05/06/24 post treatment nodule in RLL 8 mm --Scheduled for repeat CT Chest 11/19/24  Tobacco cessation --Quit smoking July 2022  Health Maintenance Immunization History  Administered Date(s) Administered   Fluad Quad(high Dose 65+) 10/11/2021, 08/04/2022   Influenza-Unspecified 09/14/2008   PFIZER Comirnaty(Gray Top)Covid-19 Tri-Sucrose Vaccine 06/06/2021   Zoster Recombinant(Shingrix) 10/17/2022   CT Lung Screen- Currently followed by Rad Onc  No orders of the defined types were placed in this encounter.  Meds ordered this encounter  Medications   ipratropium-albuterol  (DUONEB) 0.5-2.5 (3) MG/3ML SOLN    Sig: Take 3 mLs by nebulization every 6 (six) hours as needed.    Dispense:  360 mL  Refill:  5   Return in about 6 months (around 04/28/2025).   I have spent a total time of 32-minutes on the day of the appointment including chart review, data review, collecting history, coordinating care and discussing medical diagnosis and plan with the patient/family. Past medical history, allergies, medications were reviewed. Pertinent imaging, labs and tests included in this note have been reviewed and interpreted independently by me.  Rai Severns Slater Staff, MD [facility]

## 2024-10-28 NOTE — Patient Instructions (Signed)
 COPD, unspecified - worsening bronchitis --CONTINUE budesonide  nebulizer 0.5 mg/2ml in the morning and evening. Consider using albuterol  beforehand --START Duonebs (ipratropium-albuterol ) nebulizer up to twice a day AS NEEDED for mucous clearance --CONTINUE Stiolto 2.5 TWO puffs ONCE daily.  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

## 2024-10-31 ENCOUNTER — Telehealth: Payer: Self-pay | Admitting: *Deleted

## 2024-10-31 NOTE — Telephone Encounter (Signed)
 CALLED PATIENT TO INFORM OF CT FOR 11-08-24- ARRIVAL TIME- 3:15 PM @ [facility], NO RESTRICTIONS TO SCAN, PATIENT TO RECEIVE RESULTS FROM DR. KINARD ON 11-19-24 @ 11:45 AM, LVM FOR A RETURN CALL

## 2024-11-08 ENCOUNTER — Ambulatory Visit (HOSPITAL_BASED_OUTPATIENT_CLINIC_OR_DEPARTMENT_OTHER)
Admission: RE | Admit: 2024-11-08 | Discharge: 2024-11-08 | Disposition: A | Discharge status: Home / Self Care | Source: Ambulatory Visit | Attending: Radiology | Admitting: Radiology

## 2024-11-08 DIAGNOSIS — R911 Solitary pulmonary nodule: Secondary | ICD-10-CM | POA: Insufficient documentation

## 2024-11-18 ENCOUNTER — Ambulatory Visit: Payer: Self-pay | Admitting: Radiation Oncology

## 2024-11-18 NOTE — Progress Notes (Signed)
 "  Radiation Oncology         [phone] ________________________________  Name: Martin Mendez MRN: [id number]  Date: 11/19/2024  DOB: January 09, 1946  Follow-Up Visit Note  CC: Ransom Other, MD  Kassie Acquanetta Bradley, MD    ICD-10-CM   1. Right lower lobe pulmonary nodule  R91.1        Diagnosis: Diagnosis: Putative stage IA2 (cT1b, N0, M0) NSCLC of the RLL; s/p SBRT completed on 12/01/2022   Interval Since Last Radiation: 1 year 11 months 18 days   Intent: Curative  Radiation Treatment Dates: 11/24/2022 through 12/01/2022 Site Technique Total Dose (Gy) Dose per Fx (Gy) Completed Fx Beam Energies  Lung, Right: Lung_R IMRT 54/54 18 3/3 [id number]      Narrative:  The patient returns today for routine follow-up and to review most recent imaging. He was last seen in office on 05/09/24 for a routine follow up. Patient continued to follow up with their specialists to manage their chronic conditions.    In the interval since he was last seen, he presented to the ED on 09/05/24 complaining of chest tightness. CXR showed no acute findings. Further workup was without any significant findings and therefore he was discharged. He presented for a follow up with Dr. Kassie on 10/28/24 where they opted to continue his current treatment regimen of budesonide  and albuterol  and Stiolto.      Most recent CT chest preformed on 11/08/24 showed posterior segment right upper lobe nodule is stable in size, 7 mm with no evidence of metastatic disease.   On evaluation today the patient denies any pain within the chest area significant cough or hemoptysis.  He denies any significant changes in his breathing.  He continues use to use a nebulizer daily.                             Allergies:  is allergic to penicillins, dupixent  [dupilumab ], and sulfa antibiotics.  Meds: Current Outpatient Medications  Medication Sig Dispense Refill   acetaminophen  (TYLENOL ) 500 MG tablet Take 500 mg by mouth as needed.     albuterol   (VENTOLIN  HFA) 108 (90 Base) MCG/ACT inhaler TAKE 2 PUFFS BY MOUTH EVERY 6 HOURS AS NEEDED FOR WHEEZE OR SHORTNESS OF BREATH 18 each 2   budesonide  (PULMICORT ) 0.5 MG/2ML nebulizer solution Take 2 mLs (0.5 mg total) by nebulization 2 (two) times daily. 120 mL 5   citalopram  (CELEXA ) 20 MG tablet Take 1 tablet (20 mg total) by mouth every morning. 90 tablet 3   dextromethorphan-guaiFENesin  (MUCINEX  DM) 30-600 MG 12hr tablet Take 1 tablet by mouth 2 (two) times daily as needed for cough.     faricimab -svoa (VABYSMO ) 6 MG/0.05ML SOLN intravitreal injection 6 mg by Intravitreal route once.     ipratropium-albuterol  (DUONEB) 0.5-2.5 (3) MG/3ML SOLN Take 3 mLs by nebulization every 6 (six) hours as needed. 360 mL 5   Multiple Vitamin (MULTI VITAMIN DAILY PO) Take by mouth.     Multiple Vitamins-Minerals (PRESERVISION AREDS 2 PO) Take 1 capsule by mouth daily.     propranolol  (INDERAL ) 10 MG tablet Take 1 tablet (10 mg total) by mouth daily as needed. 30 tablet 0   Tiotropium Bromide-Olodaterol (STIOLTO RESPIMAT ) 2.5-2.5 MCG/ACT AERS Inhale 2 puffs into the lungs daily. 4 g 11   No current facility-administered medications for this encounter.    Physical Findings: The patient is in no acute distress. Patient is alert and oriented.  weight is 182 lb 9.6 oz (82.8 kg). His temperature is 97.7 F (36.5 C). His blood pressure is 127/78 and his pulse is 84. His respiration is 17 and oxygen  saturation is 90%. .   Lungs are clear to auscultation bilaterally. Heart has regular rate and rhythm. No palpable cervical, supraclavicular, or axillary adenopathy. Abdomen soft, non-tender, normal bowel sounds.   Lab Findings: Lab Results  Component Value Date   WBC 7.9 09/05/2024   HGB 15.3 09/05/2024   HCT 45.0 09/05/2024   MCV 89.9 09/05/2024   PLT 294 09/05/2024    Radiographic Findings: CT CHEST WO CONTRAST Result Date: 11/18/2024 CLINICAL DATA:  Non-small cell lung cancer.  * Tracking Code: BO * EXAM: CT  CHEST WITHOUT CONTRAST TECHNIQUE: Multidetector CT imaging of the chest was performed following the standard protocol without IV contrast. RADIATION DOSE REDUCTION: This exam was performed according to the departmental dose-optimization program which includes automated exposure control, adjustment of the mA and/or kV according to patient size and/or use of iterative reconstruction technique. COMPARISON:  05/06/2024. FINDINGS: Cardiovascular: Atherosclerotic calcification of the aorta, aortic valve and coronary arteries. Heart size normal. No pericardial effusion. Mediastinum/Nodes: No pathologically enlarged mediastinal or axillary lymph nodes. Hilar regions are difficult to definitively evaluate without IV contrast. Air in the esophagus can be seen with dysmotility. Lungs/Pleura: Centrilobular and paraseptal emphysema. Posterior segment right upper lobe nodule is stable in size, 7 mm (6 x 7 mm, 3/54). Pleuroparenchymal scarring in the superior segment right lower lobe. Additional scattered millimetric pulmonary nodules are unchanged. No pleural fluid. Debris in the right mainstem bronchus. Upper Abdomen: Low-attenuation lesions in the liver, unchanged. Small low-attenuation lesion in the right kidney. No specific follow-up necessary. Bilateral renal stones, left greater than right. Vascular coils in the periportal region. Visualized portions of the liver, gallbladder, adrenal glands, kidneys, spleen, pancreas, stomach and bowel are otherwise grossly unremarkable. No upper abdominal adenopathy. Musculoskeletal: Degenerative changes in the spine. IMPRESSION: 1. Stable posterior segment right upper lobe nodule. Recommend continued attention on follow-up. Otherwise, no evidence of metastatic disease. 2. Bilateral renal stones. 3. Aortic atherosclerosis (ICD10-I70.0). Coronary artery calcification. 4.  Emphysema (ICD10-J43.9). Electronically Signed   By: Newell Eke M.D.   On: 11/18/2024 10:00    Impression:   Putative stage IA2 (cT1b, N0, M0) NSCLC of the RLL; s/p SBRT completed on 12/01/2022  The patient is doing well overall today, no evidence of recurrence on clinical exam today. We personally reviewed the results of his most recent CT which demonstrated stable post-radiation changes in the RLL with no evidence of disease progression or metastasis. The previously noted RUL pulmonary nodule has remained stable.   Plan:  Follow up chest CT in 6 months. Routine office visit 1-2 days after scan to review results. Patient was encouraged to call with any questions or concerns in the meantime.      25 minutes of total time was spent for this patient encounter, including preparation, face-to-face counseling with the patient and coordination of care, physical exam, and documentation of the encounter. ____________________________________  Lynwood CHARM Nasuti, M.D.     This document serves as a record of services personally performed by Leeroy Due, PA-C. It was created on her behalf by Reymundo Cartwright, a trained medical scribe. The creation of this record is based on the scribe's personal observations and the provider's statements to them. This document has been checked and approved by the attending provider.  "

## 2024-11-19 ENCOUNTER — Encounter: Payer: Self-pay | Admitting: Radiation Oncology

## 2024-11-19 ENCOUNTER — Ambulatory Visit
Admission: RE | Admit: 2024-11-19 | Discharge: 2024-11-19 | Disposition: A | Discharge status: Home / Self Care | Source: Ambulatory Visit | Attending: Radiation Oncology | Admitting: Radiation Oncology

## 2024-11-19 VITALS — BP 127/78 | HR 84 | Temp 97.7°F | Resp 17 | Wt 182.6 lb

## 2024-11-19 DIAGNOSIS — N200 Calculus of kidney: Secondary | ICD-10-CM | POA: Diagnosis not present

## 2024-11-19 DIAGNOSIS — K769 Liver disease, unspecified: Secondary | ICD-10-CM | POA: Insufficient documentation

## 2024-11-19 DIAGNOSIS — M479 Spondylosis, unspecified: Secondary | ICD-10-CM | POA: Insufficient documentation

## 2024-11-19 DIAGNOSIS — Z923 Personal history of irradiation: Secondary | ICD-10-CM | POA: Diagnosis not present

## 2024-11-19 DIAGNOSIS — I700 Atherosclerosis of aorta: Secondary | ICD-10-CM | POA: Insufficient documentation

## 2024-11-19 DIAGNOSIS — R911 Solitary pulmonary nodule: Secondary | ICD-10-CM

## 2024-11-19 DIAGNOSIS — Z79899 Other long term (current) drug therapy: Secondary | ICD-10-CM | POA: Insufficient documentation

## 2024-11-19 DIAGNOSIS — Z7951 Long term (current) use of inhaled steroids: Secondary | ICD-10-CM | POA: Insufficient documentation

## 2024-11-19 DIAGNOSIS — R918 Other nonspecific abnormal finding of lung field: Secondary | ICD-10-CM | POA: Diagnosis present

## 2024-11-19 DIAGNOSIS — I2510 Atherosclerotic heart disease of native coronary artery without angina pectoris: Secondary | ICD-10-CM | POA: Insufficient documentation

## 2024-11-19 DIAGNOSIS — J432 Centrilobular emphysema: Secondary | ICD-10-CM | POA: Diagnosis not present

## 2024-11-19 NOTE — Progress Notes (Signed)
 Martin Mendez is here today for follow up post radiation to the lung.  Lung Side:  Right , patient completed treatment on 12/01/22.   Does the patient complain of any of the following: Pain: No  Shortness of breath w/wo exertion:  Yes, mostly on exertion.  Cough:  Yes, productive.  Hemoptysis: No  Pain with swallowing: No Swallowing/choking concerns: No Appetite: Good  Energy Level: Fair  Post radiation skin Changes: No     Additional comments if applicable:   BP 127/78 (BP Location: Left Arm, Patient Position: Sitting)   Pulse 84   Temp 97.7 F (36.5 C)   Resp 17   Wt 182 lb 9.6 oz (82.8 kg)   SpO2 90%   BMI 26.20 kg/m

## 2024-11-28 ENCOUNTER — Other Ambulatory Visit (HOSPITAL_BASED_OUTPATIENT_CLINIC_OR_DEPARTMENT_OTHER): Payer: Self-pay

## 2024-11-28 ENCOUNTER — Other Ambulatory Visit (HOSPITAL_COMMUNITY): Payer: Self-pay

## 2024-11-28 ENCOUNTER — Telehealth (HOSPITAL_BASED_OUTPATIENT_CLINIC_OR_DEPARTMENT_OTHER): Payer: Self-pay | Admitting: Pulmonary Disease

## 2024-11-28 DIAGNOSIS — J449 Chronic obstructive pulmonary disease, unspecified: Secondary | ICD-10-CM

## 2024-11-28 MED ORDER — BUDESONIDE 0.5 MG/2ML IN SUSP
0.5000 mg | Freq: Two times a day (BID) | RESPIRATORY_TRACT | 5 refills | Status: DC
  Filled 2024-11-28: qty 120, 30d supply, fill #0

## 2024-11-28 MED ORDER — BUDESONIDE 0.5 MG/2ML IN SUSP
0.5000 mg | Freq: Two times a day (BID) | RESPIRATORY_TRACT | 5 refills | Status: AC
  Filled 2024-11-30: qty 120, 30d supply, fill #0

## 2024-11-28 NOTE — Telephone Encounter (Signed)
 Ordered budesonide  to be filed under Part B

## 2024-11-28 NOTE — Addendum Note (Signed)
 Addended by: KASSIE ACQUANETTA BRADLEY on: 11/28/2024 03:48 PM   Modules accepted: Orders

## 2024-11-29 ENCOUNTER — Other Ambulatory Visit (HOSPITAL_COMMUNITY): Payer: Self-pay

## 2024-11-29 ENCOUNTER — Other Ambulatory Visit (HOSPITAL_BASED_OUTPATIENT_CLINIC_OR_DEPARTMENT_OTHER): Payer: Self-pay

## 2024-11-30 ENCOUNTER — Other Ambulatory Visit (HOSPITAL_COMMUNITY): Payer: Self-pay

## 2024-12-03 ENCOUNTER — Other Ambulatory Visit: Payer: Self-pay

## 2024-12-06 ENCOUNTER — Other Ambulatory Visit (HOSPITAL_BASED_OUTPATIENT_CLINIC_OR_DEPARTMENT_OTHER): Payer: Self-pay | Admitting: Pulmonary Disease

## 2024-12-11 ENCOUNTER — Emergency Department (HOSPITAL_COMMUNITY)

## 2024-12-11 ENCOUNTER — Other Ambulatory Visit: Payer: Self-pay

## 2024-12-11 ENCOUNTER — Inpatient Hospital Stay (HOSPITAL_COMMUNITY)
Admission: EM | Admit: 2024-12-11 | Discharge: 2024-12-13 | DRG: 190 | Disposition: A | Discharge status: Home / Self Care | Attending: Internal Medicine | Admitting: Internal Medicine

## 2024-12-11 DIAGNOSIS — Z79899 Other long term (current) drug therapy: Secondary | ICD-10-CM | POA: Diagnosis not present

## 2024-12-11 DIAGNOSIS — Z923 Personal history of irradiation: Secondary | ICD-10-CM

## 2024-12-11 DIAGNOSIS — Z888 Allergy status to other drugs, medicaments and biological substances status: Secondary | ICD-10-CM | POA: Diagnosis not present

## 2024-12-11 DIAGNOSIS — J441 Chronic obstructive pulmonary disease with (acute) exacerbation: Principal | ICD-10-CM | POA: Diagnosis present

## 2024-12-11 DIAGNOSIS — G4700 Insomnia, unspecified: Secondary | ICD-10-CM | POA: Diagnosis present

## 2024-12-11 DIAGNOSIS — Z87891 Personal history of nicotine dependence: Secondary | ICD-10-CM | POA: Diagnosis not present

## 2024-12-11 DIAGNOSIS — B974 Respiratory syncytial virus as the cause of diseases classified elsewhere: Secondary | ICD-10-CM | POA: Diagnosis present

## 2024-12-11 DIAGNOSIS — J9621 Acute and chronic respiratory failure with hypoxia: Secondary | ICD-10-CM | POA: Diagnosis present

## 2024-12-11 DIAGNOSIS — F4310 Post-traumatic stress disorder, unspecified: Secondary | ICD-10-CM | POA: Diagnosis present

## 2024-12-11 DIAGNOSIS — Z85118 Personal history of other malignant neoplasm of bronchus and lung: Secondary | ICD-10-CM

## 2024-12-11 DIAGNOSIS — Z1152 Encounter for screening for COVID-19: Secondary | ICD-10-CM

## 2024-12-11 DIAGNOSIS — Z880 Allergy status to penicillin: Secondary | ICD-10-CM

## 2024-12-11 DIAGNOSIS — Z789 Other specified health status: Secondary | ICD-10-CM

## 2024-12-11 DIAGNOSIS — J9612 Chronic respiratory failure with hypercapnia: Secondary | ICD-10-CM | POA: Diagnosis present

## 2024-12-11 DIAGNOSIS — Z882 Allergy status to sulfonamides status: Secondary | ICD-10-CM

## 2024-12-11 LAB — CBC WITH DIFFERENTIAL/PLATELET
Abs Immature Granulocytes: 0.12 10*3/uL — ABNORMAL HIGH (ref 0.00–0.07)
Basophils Absolute: 0.1 10*3/uL (ref 0.0–0.1)
Basophils Relative: 1 %
Eosinophils Absolute: 0.3 10*3/uL (ref 0.0–0.5)
Eosinophils Relative: 4 %
HCT: 49.5 % (ref 39.0–52.0)
Hemoglobin: 16.4 g/dL (ref 13.0–17.0)
Immature Granulocytes: 1 %
Lymphocytes Relative: 16 %
Lymphs Abs: 1.4 10*3/uL (ref 0.7–4.0)
MCH: 29.8 pg (ref 26.0–34.0)
MCHC: 33.1 g/dL (ref 30.0–36.0)
MCV: 90 fL (ref 80.0–100.0)
Monocytes Absolute: 0.7 10*3/uL (ref 0.1–1.0)
Monocytes Relative: 8 %
Neutro Abs: 6.1 10*3/uL (ref 1.7–7.7)
Neutrophils Relative %: 70 %
Platelets: 276 10*3/uL (ref 150–400)
RBC: 5.5 MIL/uL (ref 4.22–5.81)
RDW: 13.5 % (ref 11.5–15.5)
WBC: 8.7 10*3/uL (ref 4.0–10.5)
nRBC: 0 % (ref 0.0–0.2)

## 2024-12-11 LAB — COMPREHENSIVE METABOLIC PANEL WITH GFR
ALT: 14 U/L (ref 0–44)
AST: 26 U/L (ref 15–41)
Albumin: 4.4 g/dL (ref 3.5–5.0)
Alkaline Phosphatase: 105 U/L (ref 38–126)
Anion gap: 10 (ref 5–15)
BUN: 11 mg/dL (ref 8–23)
CO2: 30 mmol/L (ref 22–32)
Calcium: 9.4 mg/dL (ref 8.9–10.3)
Chloride: 99 mmol/L (ref 98–111)
Creatinine, Ser: 0.8 mg/dL (ref 0.61–1.24)
GFR, Estimated: >60 mL/min
Glucose, Bld: 118 mg/dL — ABNORMAL HIGH (ref 70–99)
Potassium: 4.7 mmol/L (ref 3.5–5.1)
Sodium: 138 mmol/L (ref 135–145)
Total Bilirubin: 0.6 mg/dL (ref 0.0–1.2)
Total Protein: 7.5 g/dL (ref 6.5–8.1)

## 2024-12-11 LAB — I-STAT VENOUS BLOOD GAS, ED
Acid-Base Excess: 7 mmol/L — ABNORMAL HIGH (ref 0.0–2.0)
Bicarbonate: 36.6 mmol/L — ABNORMAL HIGH (ref 20.0–28.0)
Calcium, Ion: 1.13 mmol/L — ABNORMAL LOW (ref 1.15–1.40)
HCT: 50 % (ref 39.0–52.0)
Hemoglobin: 17 g/dL (ref 13.0–17.0)
O2 Saturation: 74 %
Potassium: 5.5 mmol/L — ABNORMAL HIGH (ref 3.5–5.1)
Sodium: 136 mmol/L (ref 135–145)
TCO2: 39 mmol/L — ABNORMAL HIGH (ref 22–32)
pCO2, Ven: 73.6 mmHg (ref 44–60)
pH, Ven: 7.304 (ref 7.25–7.43)
pO2, Ven: 45 mmHg (ref 32–45)

## 2024-12-11 LAB — TROPONIN T, HIGH SENSITIVITY
Troponin T High Sensitivity: 12 ng/L (ref 0–19)
Troponin T High Sensitivity: 10 ng/L (ref 0–19)

## 2024-12-11 LAB — I-STAT CHEM 8, ED
BUN: 15 mg/dL (ref 8–23)
Calcium, Ion: 1.14 mmol/L — ABNORMAL LOW (ref 1.15–1.40)
Chloride: 97 mmol/L — ABNORMAL LOW (ref 98–111)
Creatinine, Ser: 0.9 mg/dL (ref 0.61–1.24)
Glucose, Bld: 119 mg/dL — ABNORMAL HIGH (ref 70–99)
HCT: 50 % (ref 39.0–52.0)
Hemoglobin: 17 g/dL (ref 13.0–17.0)
Potassium: 5.5 mmol/L — ABNORMAL HIGH (ref 3.5–5.1)
Sodium: 136 mmol/L (ref 135–145)
TCO2: 34 mmol/L — ABNORMAL HIGH (ref 22–32)

## 2024-12-11 LAB — PRO BRAIN NATRIURETIC PEPTIDE: Pro Brain Natriuretic Peptide: 516 pg/mL — ABNORMAL HIGH

## 2024-12-11 LAB — RESP PANEL BY RT-PCR (RSV, FLU A&B, COVID)  RVPGX2
Influenza A by PCR: NEGATIVE
Influenza B by PCR: NEGATIVE
Resp Syncytial Virus by PCR: POSITIVE — AB
SARS Coronavirus 2 by RT PCR: NEGATIVE

## 2024-12-11 LAB — I-STAT CG4 LACTIC ACID, ED
Lactic Acid, Venous: 0.7 mmol/L (ref 0.5–1.9)
Lactic Acid, Venous: 0.9 mmol/L (ref 0.5–1.9)

## 2024-12-11 MED ORDER — ENOXAPARIN SODIUM 40 MG/0.4ML IJ SOSY
40.0000 mg | PREFILLED_SYRINGE | INTRAMUSCULAR | Status: DC
  Administered 2024-12-11 – 2024-12-13 (×3): 40 mg via SUBCUTANEOUS
  Filled 2024-12-11 (×3): qty 0.4

## 2024-12-11 MED ORDER — IPRATROPIUM-ALBUTEROL 0.5-2.5 (3) MG/3ML IN SOLN
3.0000 mL | Freq: Once | RESPIRATORY_TRACT | Status: AC
  Filled 2024-12-11: qty 3

## 2024-12-11 MED ORDER — PREDNISONE 20 MG PO TABS
40.0000 mg | ORAL_TABLET | Freq: Every day | ORAL | Status: DC
  Administered 2024-12-11 – 2024-12-13 (×3): 40 mg via ORAL
  Filled 2024-12-11 (×3): qty 2

## 2024-12-11 MED ORDER — AMOXICILLIN-POT CLAVULANATE 875-125 MG PO TABS
1.0000 | ORAL_TABLET | Freq: Two times a day (BID) | ORAL | Status: DC
  Administered 2024-12-11 – 2024-12-13 (×5): 1 via ORAL
  Filled 2024-12-11 (×5): qty 1

## 2024-12-11 MED ORDER — UMECLIDINIUM-VILANTEROL 62.5-25 MCG/ACT IN AEPB
1.0000 | INHALATION_SPRAY | Freq: Every day | RESPIRATORY_TRACT | Status: DC
  Administered 2024-12-12 – 2024-12-13 (×2): 1 via RESPIRATORY_TRACT
  Filled 2024-12-11: qty 14

## 2024-12-11 MED ORDER — DM-GUAIFENESIN ER 30-600 MG PO TB12
1.0000 | ORAL_TABLET | Freq: Two times a day (BID) | ORAL | Status: DC | PRN
  Administered 2024-12-11: 1 via ORAL
  Filled 2024-12-11: qty 1

## 2024-12-11 MED ORDER — BUDESONIDE 0.5 MG/2ML IN SUSP
0.5000 mg | Freq: Two times a day (BID) | RESPIRATORY_TRACT | Status: DC
  Administered 2024-12-11 – 2024-12-13 (×5): 0.5 mg via RESPIRATORY_TRACT
  Filled 2024-12-11 (×5): qty 2

## 2024-12-11 MED ORDER — IPRATROPIUM-ALBUTEROL 0.5-2.5 (3) MG/3ML IN SOLN
3.0000 mL | Freq: Four times a day (QID) | RESPIRATORY_TRACT | Status: DC | PRN
  Administered 2024-12-11: 3 mL via RESPIRATORY_TRACT
  Filled 2024-12-11: qty 3

## 2024-12-11 MED ORDER — MONTELUKAST SODIUM 10 MG PO TABS
10.0000 mg | ORAL_TABLET | Freq: Every day | ORAL | Status: DC
  Administered 2024-12-11 – 2024-12-12 (×2): 10 mg via ORAL
  Filled 2024-12-11 (×2): qty 1

## 2024-12-11 MED ORDER — PHENOL 1.4 % MT LIQD
1.0000 | OROMUCOSAL | Status: DC | PRN
  Filled 2024-12-11 (×2): qty 177

## 2024-12-11 MED ORDER — CITALOPRAM HYDROBROMIDE 20 MG PO TABS
20.0000 mg | ORAL_TABLET | Freq: Every morning | ORAL | Status: DC
  Administered 2024-12-11 – 2024-12-13 (×3): 20 mg via ORAL
  Filled 2024-12-11: qty 2
  Filled 2024-12-11 (×2): qty 1

## 2024-12-11 MED ORDER — IPRATROPIUM-ALBUTEROL 0.5-2.5 (3) MG/3ML IN SOLN
RESPIRATORY_TRACT | Status: AC
  Administered 2024-12-11: 3 mL via RESPIRATORY_TRACT
  Filled 2024-12-11: qty 3

## 2024-12-11 NOTE — ED Notes (Signed)
 Message sent to provider requesting medications for his headache

## 2024-12-11 NOTE — H&P (Signed)
 " History and Physical    Patient: Martin Mendez FMW:[id number] DOB: [birth year] DOA: 12/11/2024 DOS: the patient was seen and examined on 12/11/2024 PCP: Ransom Other, MD  Patient coming from: Home  Chief Complaint:  Chief Complaint  Patient presents with   Respiratory Distress   HPI: Martin Mendez is a 79 y.o. male with medical history significant of NSCLC of the RLL s/p SBRT completed on 12/01/2022, COPD (60 pack-year smoking history; quit 2022), and PTSD who p/w AECOPD.  The patient presented with a bad headache and reported a significant amount of phlegm building up in his lungs since yesterday. Pe pt, he began having symptoms resembling a cold yesterday morning, including a runny nose and excessive sinus drainage, which worsened throughout the day; thus, the patient used his albuterol  inhaler more frequently, increasing from the usual two to three times daily to six times yesterday. When he failed to get relief from his inhaler, the patient used his BiPAP machine  (which he normally only uses at night for sleep) around 2130 due to shortness of breath. Due to difficulty breathing and inability to sleep, the patient called EMS for evaluation at approximately 0540 this morning and was BIBA to the ED.  In the ED, pt tachycardic and tachypneic w/ hypoxia (placed on BiPAP on arrival). Labs notable for K 5.5, and pro-BNP 516 (>850 c/f ADHF at his age), and RSV positive on nasal swab. CXR showed emphysematous changes most prominent in the upper lung zones, chronic bibasilar reticular opacities, and known right upper lobe pulmonary nodule. EDP started Duonebs, BiPAP and requested medicine admission.   Review of Systems: As mentioned in the history of present illness. All other systems reviewed and are negative. Past Medical History:  Diagnosis Date   COPD (chronic obstructive pulmonary disease) (HCC)    History of radiation therapy    Right Lung- 11/24/22-12/01/22- Dr. Lynwood Nasuti   PTSD  (post-traumatic stress disorder)    Past Surgical History:  Procedure Laterality Date   ESOPHAGOGASTRODUODENOSCOPY (EGD) WITH PROPOFOL  N/A 06/05/2021   Procedure: ESOPHAGOGASTRODUODENOSCOPY (EGD) WITH PROPOFOL ;  Surgeon: Burnette Fallow, MD;  Location: [facility] ENDOSCOPY;  Service: Endoscopy;  Laterality: N/A;   IR ANGIOGRAM FOLLOW UP STUDY  06/07/2021   IR ANGIOGRAM SELECTIVE EACH ADDITIONAL VESSEL  06/07/2021   IR ANGIOGRAM VISCERAL SELECTIVE  06/07/2021   IR EMBO ART  VEN HEMORR LYMPH EXTRAV  INC GUIDE ROADMAPPING  06/07/2021   IR US  GUIDE VASC ACCESS RIGHT  06/07/2021   Social History:  reports that he quit smoking about 3 years ago. His smoking use included cigarettes. He started smoking about 63 years ago. He has a 60 pack-year smoking history. He has never used smokeless tobacco. He reports that he does not drink alcohol and does not use drugs.  Allergies[1]  Family History  Problem Relation Age of Onset   Cancer Sister     Prior to Admission medications  Medication Sig Start Date End Date Taking? Authorizing Provider  albuterol  (VENTOLIN  HFA) 108 (90 Base) MCG/ACT inhaler TAKE 2 PUFFS BY MOUTH EVERY 6 HOURS AS NEEDED FOR WHEEZE OR SHORTNESS OF BREATH 12/06/24  Yes Kassie Acquanetta Bradley, MD  budesonide  (PULMICORT ) 0.5 MG/2ML nebulizer solution Take 2 mLs (0.5 mg total) by nebulization 2 (two) times daily. 11/28/24  Yes Kassie Acquanetta Bradley, MD  citalopram  (CELEXA ) 20 MG tablet Take 1 tablet (20 mg total) by mouth every morning. 08/20/24  Yes Hurst, Verneita T, PA-C  dextromethorphan -guaiFENesin  (MUCINEX  DM) 30-600 MG 12hr tablet Take  1 tablet by mouth 2 (two) times daily as needed for cough.   Yes [provider]  ipratropium-albuterol  (DUONEB) 0.5-2.5 (3) MG/3ML SOLN Take 3 mLs by nebulization every 6 (six) hours as needed. 10/28/24  Yes Kassie Acquanetta Bradley, MD  Multiple Vitamins-Minerals (PRESERVISION AREDS 2 PO) Take 1 capsule by mouth daily.   Yes [provider]  propranolol   (INDERAL ) 10 MG tablet Take 1 tablet (10 mg total) by mouth daily as needed. 08/20/24  Yes Hurst, Teresa T, PA-C  acetaminophen  (TYLENOL ) 500 MG tablet Take 500 mg by mouth as needed.    [provider]  faricimab -svoa (VABYSMO ) 6 MG/0.05ML SOLN intravitreal injection 6 mg by Intravitreal route once.    [provider]  Multiple Vitamin (MULTI VITAMIN DAILY PO) Take by mouth.    [provider]  Tiotropium Bromide-Olodaterol (STIOLTO RESPIMAT ) 2.5-2.5 MCG/ACT AERS Inhale 2 puffs into the lungs daily. 02/06/24   Kassie Acquanetta Bradley, MD    Physical Exam: Vitals:   12/11/24 1123 12/11/24 1125 12/11/24 1130 12/11/24 1139  BP:   (!) 142/86   Pulse: 99 100 95 98  Resp: 19 (!) 24 (!) 26 (!) 24  Temp:      TempSrc:      SpO2: 93% (!) 88% 95% 97%  Weight:      Height:       General: Alert, oriented x3, resting comfortably in no acute distress Respiratory: Lungs clear to auscultation bilaterally with normal respiratory effort; no w/r/r Cardiovascular: Regular rate and rhythm w/o m/r/g   Data Reviewed:  Lab Results  Component Value Date   WBC 8.7 12/11/2024   HGB 17.0 12/11/2024   HGB 17.0 12/11/2024   HCT 50.0 12/11/2024   HCT 50.0 12/11/2024   MCV 90.0 12/11/2024   PLT 276 12/11/2024   Lab Results  Component Value Date   GLUCOSE 119 (H) 12/11/2024   CALCIUM 9.4 12/11/2024   NA 136 12/11/2024   NA 136 12/11/2024   K 5.5 (H) 12/11/2024   K 5.5 (H) 12/11/2024   CO2 30 12/11/2024   CL 97 (L) 12/11/2024   BUN 15 12/11/2024   CREATININE 0.90 12/11/2024   Lab Results  Component Value Date   ALT 14 12/11/2024   AST 26 12/11/2024   ALKPHOS 105 12/11/2024   BILITOT 0.6 12/11/2024   Lab Results  Component Value Date   INR 1.1 06/07/2021   Radiology: [facility] Chest Port 1 View Result Date: 12/11/2024 EXAM: 1 VIEW(S) XRAY OF THE CHEST 12/11/2024 07:23:00 AM COMPARISON: 09/05/2024 CLINICAL HISTORY: Shortness of breath. FINDINGS: LUNGS AND PLEURA: Emphysematous  changes most prominent in upper lung zones. Chronic bibasilar reticular opacities. The patient has a known right upper lobe pulmonary nodule, which is only faintly seen on today's exam. No pleural effusion. No pneumothorax. HEART AND MEDIASTINUM: Aortic atherosclerosis. No acute abnormality of the cardiac and mediastinal silhouettes. BONES AND SOFT TISSUES: No acute osseous abnormality. IMPRESSION: 1. 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 2. Chronic bibasilar reticular opacities. 3. Known right upper lobe pulmonary nodule, faintly seen. 4. Aortic atherosclerosis. Electronically signed by: Ryan Salvage MD 12/11/2024 07:30 AM EST RP Workstation: [id number]    Assessment and Plan: 62M h/o NSCLC of the RLL s/p SBRT completed on 12/01/2022, COPD (60 pack-year smoking history; quit 2022), and PTSD who p/w AECOPD.  AECOPD -PO Augmentin  875mg  BID for 5 days iso AECOPD -PO prednisone  40mg  daily for 5 days iso AECOPD -Triple inhaler therapy (LAMA/LABA/ICS); consider Trelegy at  discharge; consider IP Pulm consult to optimize OP meds given frequent admissions -Duonebs prn -Start montelukast  10mg  at bedtime -Incentive spirometry and flutter valve prn -F/u BNP and diurese prn -Wean O2 as tolerated -Ambulatory pulse prior to d/c -OP Pulm f/u on d/c; consider Pulm rehab referral as well   Advance Care Planning:   Code Status: Full Code   Consults: N/A  Family Communication: Spouse, Brenda  Severity of Illness: The appropriate patient status for this patient is INPATIENT. Inpatient status is judged to be reasonable and necessary in order to provide the required intensity of service to ensure the patient's safety. The patient's presenting symptoms, physical exam findings, and initial radiographic and laboratory data in the context of their chronic comorbidities is felt to place them at high risk for further clinical deterioration. Furthermore, it is not anticipated that the  patient will be medically stable for discharge from the hospital within 2 midnights of admission.   * I certify that at the point of admission it is my clinical judgment that the patient will require inpatient hospital care spanning beyond 2 midnights from the point of admission due to high intensity of service, high risk for further deterioration and high frequency of surveillance required.*   ------- I spent 57 minutes reviewing previous notes, at the bedside counseling/discussing the treatment plan, and performing clinical documentation.   Author: Marsha Ada, MD 12/11/2024 11:48 AM  For on call review [email or web address].     [1]  Allergies Allergen Reactions   Penicillins Swelling    Did it involve swelling of the face/tongue/throat, SOB, or low BP? No Did it involve sudden or severe rash/hives, skin peeling, or any reaction on the inside of your mouth or nose? No Did you need to seek medical attention at a hospital or doctor's office? No When did it last happen?    55 years ago (age 4)   If all above answers are NO, may proceed with cephalosporin use.  Swelling of arms and legs   Dupixent  [Dupilumab ] Rash   Prilosec [Omeprazole] Rash    Red facial splotches   Protonix  [Pantoprazole ] Rash    Red facial splotches   Sulfa Antibiotics Rash    tongue   "

## 2024-12-11 NOTE — ED Provider Notes (Signed)
 " [place] [facility] DEPARTMENT AT [facility] Provider Note   CSN: [id number] Arrival date & time: 12/11/24  9298     Patient presents with: Respiratory Distress   Martin Mendez is a 79 y.o. male.   79 year old male with prior medical history as detailed below presents for evaluation.  Patient with known history of COPD patient reports increased shortness of breath and wheezing x 24 hours.  Symptoms were significantly worse this morning.  He called EMS.  EMS reports the room air saturations were in the mid 80s.  Patient placed on CPAP.  Patient given Solu-Medrol , magnesium , albuterol  breathing treatments while being transported by EMS.  On arrival patient is improved however he is still wheezing and with noticeable increased work of breathing.  The history is provided by the patient and medical records.       Prior to Admission medications  Medication Sig Start Date End Date Taking? Authorizing Provider  acetaminophen  (TYLENOL ) 500 MG tablet Take 500 mg by mouth as needed.    [provider]  albuterol  (VENTOLIN  HFA) 108 (90 Base) MCG/ACT inhaler TAKE 2 PUFFS BY MOUTH EVERY 6 HOURS AS NEEDED FOR WHEEZE OR SHORTNESS OF BREATH 12/06/24   Kassie Acquanetta Bradley, MD  budesonide  (PULMICORT ) 0.5 MG/2ML nebulizer solution Take 2 mLs (0.5 mg total) by nebulization 2 (two) times daily. 11/28/24   Kassie Acquanetta Bradley, MD  citalopram  (CELEXA ) 20 MG tablet Take 1 tablet (20 mg total) by mouth every morning. 08/20/24   Rhys Boyer T, PA-C  dextromethorphan -guaiFENesin  (MUCINEX  DM) 30-600 MG 12hr tablet Take 1 tablet by mouth 2 (two) times daily as needed for cough.    [provider]  faricimab -svoa (VABYSMO ) 6 MG/0.05ML SOLN intravitreal injection 6 mg by Intravitreal route once.    [provider]  ipratropium-albuterol  (DUONEB) 0.5-2.5 (3) MG/3ML SOLN Take 3 mLs by nebulization every 6 (six) hours as needed. 10/28/24   Kassie Acquanetta Bradley, MD  Multiple Vitamin (MULTI  VITAMIN DAILY PO) Take by mouth.    [provider]  Multiple Vitamins-Minerals (PRESERVISION AREDS 2 PO) Take 1 capsule by mouth daily.    [provider]  propranolol  (INDERAL ) 10 MG tablet Take 1 tablet (10 mg total) by mouth daily as needed. 08/20/24   Rhys Boyer T, PA-C  Tiotropium Bromide-Olodaterol (STIOLTO RESPIMAT ) 2.5-2.5 MCG/ACT AERS Inhale 2 puffs into the lungs daily. 02/06/24   Kassie Acquanetta Bradley, MD    Allergies: Penicillins, Dupixent  [dupilumab ], and Sulfa antibiotics    Review of Systems  All other systems reviewed and are negative.   Updated Vital Signs There were no vitals taken for this visit.  Physical Exam Vitals and nursing note reviewed.  Constitutional:      General: He is not in acute distress.    Appearance: He is well-developed.  HENT:     Head: Normocephalic and atraumatic.  Eyes:     Conjunctiva/sclera: Conjunctivae normal.  Cardiovascular:     Rate and Rhythm: Normal rate and regular rhythm.     Heart sounds: No murmur heard. Pulmonary:     Effort: Respiratory distress present.     Breath sounds: Wheezing present.     Comments: Increased work of breathing, mild to moderate respiratory distress, diffuse wheezes bilaterally Abdominal:     Palpations: Abdomen is soft.     Tenderness: There is no abdominal tenderness.  Musculoskeletal:        General: No swelling.     Cervical back: Neck supple.  Skin:  General: Skin is warm and dry.     Capillary Refill: Capillary refill takes less than 2 seconds.  Neurological:     Mental Status: He is alert.  Psychiatric:        Mood and Affect: Mood normal.     (all labs ordered are listed, but only abnormal results are displayed) Labs Reviewed  RESP PANEL BY RT-PCR (RSV, FLU A&B, COVID)  [id number]  COMPREHENSIVE METABOLIC PANEL WITH GFR  PRO BRAIN NATRIURETIC PEPTIDE  CBC WITH DIFFERENTIAL/PLATELET  I-STAT VENOUS BLOOD GAS, ED  I-STAT CHEM 8, ED  I-STAT CG4 LACTIC ACID, ED   TROPONIN T, HIGH SENSITIVITY    EKG: None  Radiology: No results found.   Procedures   Medications Ordered in the ED  ipratropium-albuterol  (DUONEB) 0.5-2.5 (3) MG/3ML nebulizer solution 3 mL (has no administration in time range)                                    Medical Decision Making Patient is presenting with increased shortness of breath, wheezing.  Patient with known history of COPD.  He is not on O2 at baseline.  Patient was hypoxic into the mid 59s with EMS.  He was placed on CPAP and then on BiPAP on arrival to the ED.  He is improving with the treatment.  However, he will benefit from admission for further workup and treatment.  Initial chest x-ray does not suggest concurrent pneumonia.  VBG pCO2 is 73.  Screening labs otherwise obtained are reassuring.  Hospitalist service made aware of case and will evaluate for admission.   Amount and/or Complexity of Data Reviewed Labs: ordered. Radiology: ordered.  Risk Prescription drug management. Decision regarding hospitalization.        Final diagnoses:  COPD exacerbation [facility])    ED Discharge Orders     None          Laurice Maude BROCKS, MD 12/13/24 1419  "

## 2024-12-11 NOTE — ED Triage Notes (Signed)
 Pt BIBA from home for respiratory distress. Onset last night. Woke up this morning still SOB while using CPAP. EMS was called. EMS placed him on CPAP and administered 125mg  Solumedrol, 2gms MgSo4, Hx of COPD, Duo Neb x 2. Pt Aox4 on arrival. Attending and RT present on arrival.

## 2024-12-11 NOTE — ED Notes (Signed)
 Pt refused standby assistance during urinal use.

## 2024-12-11 NOTE — ED Notes (Signed)
 Secure chat sent to provider requesting lozenges for sore throat. Awaiting response.

## 2024-12-12 ENCOUNTER — Encounter (HOSPITAL_COMMUNITY): Payer: Self-pay | Admitting: Hospitalist

## 2024-12-12 DIAGNOSIS — J441 Chronic obstructive pulmonary disease with (acute) exacerbation: Secondary | ICD-10-CM

## 2024-12-12 LAB — BASIC METABOLIC PANEL WITH GFR
Anion gap: 6 (ref 5–15)
BUN: 20 mg/dL (ref 8–23)
CO2: 34 mmol/L — ABNORMAL HIGH (ref 22–32)
Calcium: 9.2 mg/dL (ref 8.9–10.3)
Chloride: 94 mmol/L — ABNORMAL LOW (ref 98–111)
Creatinine, Ser: 0.81 mg/dL (ref 0.61–1.24)
GFR, Estimated: >60 mL/min
Glucose, Bld: 143 mg/dL — ABNORMAL HIGH (ref 70–99)
Potassium: 5.1 mmol/L (ref 3.5–5.1)
Sodium: 134 mmol/L — ABNORMAL LOW (ref 135–145)

## 2024-12-12 LAB — CBC
HCT: 47.7 % (ref 39.0–52.0)
Hemoglobin: 15.9 g/dL (ref 13.0–17.0)
MCH: 30.1 pg (ref 26.0–34.0)
MCHC: 33.3 g/dL (ref 30.0–36.0)
MCV: 90.2 fL (ref 80.0–100.0)
Platelets: 273 10*3/uL (ref 150–400)
RBC: 5.29 MIL/uL (ref 4.22–5.81)
RDW: 13.4 % (ref 11.5–15.5)
WBC: 9.7 10*3/uL (ref 4.0–10.5)
nRBC: 0 % (ref 0.0–0.2)

## 2024-12-12 MED ORDER — ACETAMINOPHEN 325 MG PO TABS
650.0000 mg | ORAL_TABLET | Freq: Four times a day (QID) | ORAL | Status: DC | PRN
  Administered 2024-12-12: 650 mg via ORAL
  Filled 2024-12-12: qty 2

## 2024-12-12 MED ORDER — IPRATROPIUM-ALBUTEROL 0.5-2.5 (3) MG/3ML IN SOLN: 3.0000 mL | Freq: Four times a day (QID) | RESPIRATORY_TRACT | Status: DC

## 2024-12-12 MED ORDER — IPRATROPIUM-ALBUTEROL 0.5-2.5 (3) MG/3ML IN SOLN: 3.0000 mL | Freq: Four times a day (QID) | RESPIRATORY_TRACT | Status: DC | PRN

## 2024-12-12 NOTE — Plan of Care (Signed)
  Problem: Clinical Measurements: Goal: Ability to maintain clinical measurements within normal limits will improve Outcome: Progressing Goal: Cardiovascular complication will be avoided Outcome: Progressing   Problem: Nutrition: Goal: Adequate nutrition will be maintained Outcome: Progressing   

## 2024-12-12 NOTE — Progress Notes (Addendum)
 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  He was found to have RSV infection admitted for the treatment of acute exacerbation of COPD, RSV infection as well as acute respiratory failure with hypoxia. He lives at home with his wife and does have inhalers, nebulizers as well as BiPAP machine which he uses at bedtime and oxygen  through that.  Patient is not dependent on oxygen  during the daytime at home.  Assessment & Plan:  Principal Problem:   COPD with acute exacerbation (HCC)   RSV infection, POA: Conservative management.  Droplet precautions.  Acute exacerbation of COPD in setting of RSV infection, POA: Continue with an additional bronchodilator therapy along with p.o. prednisone  40 mg daily for total of 5 days.  Continue with Augmentin  875 mg p.o. twice daily for 5 days for any possibility of superimposed bacterial infection. Patient does have inhalers, nebulizer as well as BiPAP machine at home. Patient will need a 6-minute walk test/home oxygen  desaturation screen prior to discharge. Continue with montelukast  5 mg daily Continue with incentive spirometry and flutter valve as needed  Acute hypoxic respiratory failure, POA: Needing 2 L oxygen  to maintain his oxygen  saturation between 88 and 90%.  Patient is not dependent on oxygen  during daytime at home.  He only uses oxygen  with his BiPAP machine at home at night. 6-minute walk test to be done prior to discharge  Chronic hypercapnic respiratory failure, POA: Patient has a normal pH with elevated pCO2.  Continue with BiPAP at night.  History of non-small cell lung cancer of the right  lower lobe status post SBRT, POA: Outpatient follow-up with oncology and pulmonology.  No acute issues.  Disposition: Lives at home with his wife and is independent in activities of daily life.   DVT prophylaxis: enoxaparin  (LOVENOX ) injection 40 mg Start: 12/11/24 1000     Code Status: Full Code Family Communication: None at the bedside Status is: Inpatient Remains inpatient appropriate because: Acute respiratory failure with hypoxia, RSV infection, COPD exacerbation.    Subjective:  He states that his shortness of breath is better.  We spoke about diagnosis of RSV infection that has led to COPD exacerbation.  He said that he does use BiPAP at home every night and oxygen  through that but does not use oxygen  during the day.  His last radiation treatment for possible lung cancer was on 12/02/2022.  He lives at home with his wife and does have inhalers as well as nebulizer at home.  Examination:  General exam: Appears calm and comfortable, nasal oxygen  cannula in place at 2 L Respiratory system: Slight coarse breath sounds bilaterally Cardiovascular system: S1 & S2 heard, RRR. No JVD, murmurs, rubs, gallops or clicks. No pedal edema. Gastrointestinal system: Abdomen is nondistended, soft and nontender. No organomegaly or masses felt. Normal bowel sounds heard. Central nervous system: Alert and oriented. No focal neurological deficits. Extremities: Symmetric 5 x 5 power. Skin: No rashes, lesions or ulcers Psychiatry: Judgement and insight appear normal. Mood & affect appropriate.     Diet Orders (From admission, onward)     Start     Ordered   12/11/24 [phone]  Diet regular Room service appropriate? Yes; Fluid consistency: Thin  Diet effective now       Question Answer Comment  Room service appropriate? Yes   Fluid consistency: Thin      12/11/24 0832            Objective: Vitals:   12/12/24 0500 12/12/24 0749 12/12/24 0810 12/12/24 1202  BP: 131/79  119/70 122/69   Pulse:   86 83  Resp:  12 18 18   Temp: 98.3 F (36.8 C)  98.6 F (37 C) 99.3 F (37.4 C)  TempSrc: Axillary  Oral Oral  SpO2:   95% 91%  Weight:      Height:        Intake/Output Summary (Last 24 hours) at 12/12/2024 1235 Last data filed at 12/12/2024 1203 Gross per 24 hour  Intake 120 ml  Output 1025 ml  Net -905 ml   Filed Weights   12/11/24 0728 12/12/24 0230  Weight: 81.6 kg 81.4 kg    Scheduled Meds:  amoxicillin -clavulanate  1 tablet Oral Q12H   budesonide   0.5 mg Nebulization BID   citalopram   20 mg Oral q morning   enoxaparin  (LOVENOX ) injection  40 mg Subcutaneous Q24H   montelukast   10 mg Oral QHS   predniSONE   40 mg Oral Q breakfast   umeclidinium-vilanterol  1 puff Inhalation Daily   Continuous Infusions:  Nutritional status     Body mass index is 25.74 kg/m.  Data Reviewed:   CBC: Recent Labs  Lab 12/11/24 0736 12/11/24 0748 12/12/24 0409  WBC 8.7  --  9.7  NEUTROABS 6.1  --   --   HGB 16.4 17.0  17.0 15.9  HCT 49.5 50.0  50.0 47.7  MCV 90.0  --  90.2  PLT 276  --  273   Basic Metabolic Panel: Recent Labs  Lab 12/11/24 0736 12/11/24 0748 12/12/24 0409  NA 138 136  136 134*  K 4.7 5.5*  5.5* 5.1  CL 99 97* 94*  CO2 30  --  34*  GLUCOSE 118* 119* 143*  BUN 11 15 20   CREATININE 0.80 0.90 0.81  CALCIUM 9.4  --  9.2   GFR: Estimated Creatinine Clearance: 75.1 mL/min (by C-G formula based on SCr of 0.81 mg/dL). Liver Function Tests: Recent Labs  Lab 12/11/24 0736  AST 26  ALT 14  ALKPHOS 105  BILITOT 0.6  PROT 7.5  ALBUMIN  4.4   No results for input(s): LIPASE, AMYLASE in the last 168 hours. No results for input(s): AMMONIA in the last 168 hours. Coagulation Profile: No results for input(s): INR, PROTIME in the last 168 hours. Cardiac Enzymes: No results for input(s): CKTOTAL, CKMB, CKMBINDEX, TROPONINI in the last 168 hours. BNP (last 3 results) Recent Labs    12/11/24 0736  PROBNP 516.0*    HbA1C: No results for input(s): HGBA1C in the last 72 hours. CBG: No results for input(s): GLUCAP in the last 168 hours. Lipid Profile: No results for input(s): CHOL, HDL, LDLCALC, TRIG, CHOLHDL, LDLDIRECT in the last 72 hours. Thyroid  Function Tests: No results for input(s): TSH, T4TOTAL, FREET4, T3FREE, THYROIDAB in the last 72 hours. Anemia Panel: No results for input(s): VITAMINB12, FOLATE, FERRITIN, TIBC, IRON, RETICCTPCT in the last 72 hours. Sepsis Labs: Recent Labs  Lab 12/11/24 0749 12/11/24 1543  LATICACIDVEN 0.7 0.9    Recent Results (from the past 240 hours)  Resp panel by RT-PCR (RSV, Flu A&B, Covid) Anterior Nasal Swab     Status: Abnormal  Collection Time: 12/11/24  7:11 AM   Specimen: Anterior Nasal Swab  Result Value Ref Range Status   SARS Coronavirus 2 by RT PCR NEGATIVE NEGATIVE Final   Influenza A by PCR NEGATIVE NEGATIVE Final   Influenza B by PCR NEGATIVE NEGATIVE Final    Comment: (NOTE) The Xpert Xpress SARS-CoV-2/FLU/RSV plus assay is intended as an aid in the diagnosis of influenza from Nasopharyngeal swab specimens and should not be used as a sole basis for treatment. Nasal washings and aspirates are unacceptable for Xpert Xpress SARS-CoV-2/FLU/RSV testing.  Fact Sheet for Patients: bloggercourse.com  Fact Sheet for Healthcare Providers: seriousbroker.it  This test is not yet approved or cleared by the [place]  FDA and has been authorized for detection and/or diagnosis of SARS-CoV-2 by FDA under an Emergency Use Authorization (EUA). This EUA will remain in effect (meaning this test can be used) for the duration of the COVID-19 declaration under Section 564(b)(1) of the Act, 21 U.S.C. section 360bbb-3(b)(1), unless the authorization is terminated or revoked.     Resp Syncytial Virus by PCR POSITIVE (A) NEGATIVE Final    Comment: (NOTE) Fact  Sheet for Patients: bloggercourse.com  Fact Sheet for Healthcare Providers: seriousbroker.it  This test is not yet approved or cleared by the [place]  FDA and has been authorized for detection and/or diagnosis of SARS-CoV-2 by FDA under an Emergency Use Authorization (EUA). This EUA will remain in effect (meaning this test can be used) for the duration of the COVID-19 declaration under Section 564(b)(1) of the Act, 21 U.S.C. section 360bbb-3(b)(1), unless the authorization is terminated or revoked.  Performed at [facility], [address], [place], [place] [address]          Radiology Studies: DG Chest Port 1 View Result Date: 12/11/2024 EXAM: 1 VIEW(S) XRAY OF THE CHEST 12/11/2024 07:23:00 AM COMPARISON: 09/05/2024 CLINICAL HISTORY: Shortness of breath. FINDINGS: LUNGS AND PLEURA: Emphysematous changes most prominent in upper lung zones. Chronic bibasilar reticular opacities. The patient has a known right upper lobe pulmonary nodule, which is only faintly seen on today's exam. No pleural effusion. No pneumothorax. HEART AND MEDIASTINUM: Aortic atherosclerosis. No acute abnormality of the cardiac and mediastinal silhouettes. BONES AND SOFT TISSUES: No acute osseous abnormality. IMPRESSION: 1. 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 2. Chronic bibasilar reticular opacities. 3. Known right upper lobe pulmonary nodule, faintly seen. 4. Aortic atherosclerosis. Electronically signed by: Ryan Salvage MD 12/11/2024 07:30 AM EST RP Workstation: [id number]           LOS: 1 day   Time spent= 35 mins    Deliliah Room, MD [facility]  If 7PM-7AM, please contact night-coverage  12/12/2024, 12:35 PM  "

## 2024-12-13 ENCOUNTER — Other Ambulatory Visit (HOSPITAL_COMMUNITY): Payer: Self-pay

## 2024-12-13 DIAGNOSIS — J441 Chronic obstructive pulmonary disease with (acute) exacerbation: Secondary | ICD-10-CM | POA: Diagnosis not present

## 2024-12-13 MED ORDER — PREDNISONE 20 MG PO TABS
40.0000 mg | ORAL_TABLET | Freq: Every day | ORAL | 0 refills | Status: DC
  Filled 2024-12-13: qty 8, 4d supply, fill #0

## 2024-12-13 MED ORDER — PROPRANOLOL HCL 10 MG PO TABS: 10.0000 mg | ORAL_TABLET | Freq: Every day | ORAL | Status: AC | PRN

## 2024-12-13 MED ORDER — AZITHROMYCIN 250 MG PO TABS
ORAL_TABLET | ORAL | 0 refills | Status: AC
  Filled 2024-12-13: qty 6, 5d supply, fill #0

## 2024-12-13 MED ORDER — MONTELUKAST SODIUM 10 MG PO TABS
10.0000 mg | ORAL_TABLET | Freq: Every day | ORAL | 0 refills | Status: AC
  Filled 2024-12-13: qty 10, 10d supply, fill #0

## 2024-12-13 NOTE — Progress Notes (Signed)
 Mobility Specialist Progress Note;  Nurse requested Mobility Specialist to perform oxygen  saturation test with pt which includes removing pt from oxygen  both at rest and while ambulating.  Below are the results from that testing.     Patient Saturations on Room Air at Rest = spO2 93%  Patient Saturations on Room Air while Ambulating = sp02 85% .    Patient Saturations on 3 Liters of oxygen  while Ambulating = sp02 92%  At end of testing pt left in room on 3  Liters of oxygen .  Reported results to nurse.    Ricky Janeal MATSU, BS Mobility Specialist Please contact via Special Educational Needs Teacher or [employer] [phone]

## 2024-12-13 NOTE — Discharge Instructions (Signed)
 Martin Mendez

## 2024-12-13 NOTE — Progress Notes (Signed)
 Mobility Specialist Progress Note;   12/13/24 1100  Mobility  Activity Ambulated with assistance  Level of Assistance Standby assist, set-up cues, supervision of patient - no hands on  Assistive Device None  Distance Ambulated (ft) 60 ft  Activity Response Tolerated well  Mobility Referral Yes  Mobility visit 1 Mobility  Mobility Specialist Start Time (ACUTE ONLY) 1045  Mobility Specialist Stop Time (ACUTE ONLY) 1100  Mobility Specialist Time Calculation (min) (ACUTE ONLY) 15 min   Pt received in bed agreeable to participate in mobility. No physical assistance required. Found on 2L/min SPO2 94%. SPO2 WFL at rest on RA. Upon ambulation pt SPO2 decreased to 85% on RA, given 2L/min however SPO2 only increased to 86%. Increased to 3L/min with SPO2 92%. Left on 3L/min, SPO2 92%. Call bell and personal belongings in reach. All needs met. RN aware.   Ricky Janeal MATSU, BS Mobility Specialist Please contact via Special Educational Needs Teacher or [employer] [phone]

## 2024-12-13 NOTE — Plan of Care (Signed)

## 2024-12-13 NOTE — TOC Initial Note (Addendum)
 Transition of Care [facility]) - Initial/Assessment Note    Patient Details  Name: Martin Mendez MRN: [id number] Date of Birth: [birth year]  [facility] CM/SW Contact:    Sudie Erminio Deems, RN Phone Number: 12/13/2024, 10:40 AM  Clinical Narrative: Patient presented for respiratory distress. PTA patient was from home with spouse. 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 [address] Patient is currently active with [employer] for oxygen  2 liters continuous with a 5 liter concentrator. Staff RN to ambulate patient to see if needs increased liter flow. [employer] will deliver a portable tank for travel home. No further needs identified a this time.                  1125 Liter flow has increased to 3 liters. [employer] is aware and portable tank to be delivered.  Expected Discharge Plan: Home/Self Care Barriers to Discharge: No Barriers Identified   Patient Goals and CMS Choice Patient states their goals for this hospitalization and ongoing recovery are:: plans to return home once stable. /  Expected Discharge Plan and Services   Discharge Planning Services: CM Consult Post Acute Care Choice: Durable Medical Equipment Living arrangements for the past 2 months: Single Family Home     HH Arranged: NA  Prior Living Arrangements/Services Living arrangements for the past 2 months: Single Family Home Lives with:: Spouse Patient language and need for interpreter reviewed:: Yes Do you feel safe going back to the place where you live?: Yes      Need for Family Participation in Patient Care: No (Comment) Care giver support system in place?: No (comment) Current home services: DME (CPAP, Oxygen - Home Fill with [employer]-2 Liters continuous.) Criminal Activity/Legal Involvement Pertinent to Current Situation/Hospitalization: No - Comment as needed  Activities of Daily Living   ADL Screening (condition at time of admission) Independently  performs ADLs?: Yes (appropriate for developmental age) Is the patient deaf or have difficulty hearing?: No Does the patient have difficulty seeing, even when wearing glasses/contacts?: No Does the patient have difficulty concentrating, remembering, or making decisions?: No  Permission Sought/Granted Permission sought to share information with : Family Supports, Case Manager                Emotional Assessment Appearance:: Appears stated age Attitude/Demeanor/Rapport: Engaged Affect (typically observed): Appropriate Orientation: : Oriented to Self, Oriented to Place Alcohol / Substance Use: Not Applicable Psych Involvement: No (comment)  Admission diagnosis:  COPD exacerbation (HCC) [J44.1] COPD with acute exacerbation (HCC) [J44.1] Patient Active Problem List   Diagnosis Date Noted   COPD with acute exacerbation (HCC) 12/11/2024   Right lower lobe pulmonary nodule 08/04/2022   Obesity 05/24/2022   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   Tobacco use disorder 05/24/2022   Chronic respiratory failure with hypercapnia (HCC) 07/08/2021   Cerebral embolism with cerebral infarction 06/11/2021   Syncope 10/10/2019   Chronic obstructive pulmonary disease (HCC) 12/13/2018   Hyponatremia 12/11/2018   Former smoker 12/10/2018   PTSD (post-traumatic stress disorder) 12/10/2018   PCP:  Ransom Other, MD Pharmacy:   [id number] [place] [place] - [address] [facility] [address] [place] [address] Phone: [phone] Fax: [phone]  [id number] - [address] [place]), [place] - [address] [place]) [place] [address] Phone: [phone] Fax: [phone]  [facility] [address] [place]  [address] Phone: [phone] Fax: [phone]  [facility] - [facility] [address] [place] [address] Phone:  [phone] Fax: [phone]  [facility] [address] [place] [address] Phone: [phone] Fax: [phone]     Social Drivers of Health (SDOH) Social History: SDOH Screenings   Food Insecurity: No Food Insecurity (12/12/2024)  Housing: Low Risk (12/12/2024)  Transportation Needs: No Transportation Needs (12/12/2024)  Utilities: Not At Risk (12/12/2024)  Social Connections: Moderately Integrated (12/12/2024)  Tobacco Use: Medium Risk (12/12/2024)   SDOH Interventions:     Readmission Risk Interventions     No data to display

## 2024-12-13 NOTE — Discharge Summary (Addendum)
 " Physician Discharge Summary   Patient: Martin Mendez MRN: [id number] DOB: 1946-06-07  Admit date:     12/11/2024  Discharge date: 12/13/24  Discharge Physician: Deliliah Room   PCP: Ransom Other, MD   Recommendations at discharge:    F/u with your PCP in one week. F/u with Pulmonology in one month.  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  He was found to have RSV infection admitted for the treatment of acute exacerbation of COPD, RSV infection as well as acute respiratory failure with hypoxia. He lives at home with his wife and does have inhalers, nebulizers as well as BiPAP machine which he uses at bedtime and oxygen  through that.  RSV infection, POA: Conservative management.  Droplet precautions.   Acute exacerbation of COPD in setting of RSV infection, POA: Continue with inhalational bronchodilator therapy along with p.o. prednisone  40 mg daily for total of 5 days.  Continue with Z-pak Patient does have inhalers, nebulizer as well as BiPAP machine at home. Continue with montelukast  daily F/u with pulmonology in one month.    Acute on chronic hypoxic respiratory failure, POA:  As per [employer] liaison,Original order is oxygen  2 liters continuous and he has a 5 liter concentrator in the home. But he only uses oxygen  at bedtime at home. Walk test was done on the discharge day and he is needing 3 L oxygen  on exertion.    Chronic hypercapnic respiratory failure, POA: Patient has a normal pH with elevated pCO2.  Continue with BiPAP at night.   History of non-small cell lung cancer of the right lower lobe status post SBRT, POA: Outpatient follow-up with oncology  and pulmonology.  No acute issues.   Disposition: Lives at home with his wife and is independent in activities of daily life.      Consultants: None Procedures performed: None  Disposition: Home Diet recommendation:  Cardiac diet DISCHARGE MEDICATION: Allergies as of 12/13/2024       Reactions   Penicillins Swelling   Did it involve swelling of the face/tongue/throat, SOB, or low BP? No Did it involve sudden or severe rash/hives, skin peeling, or any reaction on the inside of your mouth or nose? No Did you need to seek medical attention at a hospital or doctor's office? No When did it last happen?    55 years ago (age 77)   If all above answers are NO, may proceed with cephalosporin use. Swelling of arms and legs   Dupixent  [dupilumab ] Rash   Prilosec [omeprazole] Rash   Red facial splotches   Protonix  [pantoprazole ] Rash   Red facial splotches   Sulfa Antibiotics Rash        Medication List     TAKE these medications    albuterol  0.63 MG/3ML nebulizer solution Commonly known as: ACCUNEB  Take 2.5 mg by nebulization in the morning and at bedtime.   albuterol  108 (90 Base) MCG/ACT inhaler Commonly known as: VENTOLIN  HFA TAKE 2 PUFFS BY MOUTH EVERY 6 HOURS AS NEEDED FOR WHEEZE OR SHORTNESS OF BREATH   azithromycin  250 MG tablet Commonly known as: Zithromax  Z-Pak Take 2 tablets (500 mg) on  Day 1,  followed by 1 tablet (250  mg) once daily on Days 2 through 5.   budesonide  0.5 MG/2ML nebulizer solution Commonly known as: PULMICORT  Take 2 mLs (0.5 mg total) by nebulization 2 (two) times daily.   citalopram  20 MG tablet Commonly known as: CELEXA  Take 1 tablet (20 mg total) by mouth every morning.   dextromethorphan -guaiFENesin  30-600 MG 12hr tablet Commonly known as: MUCINEX  DM Take 1 tablet by mouth 2 (two) times daily.   ipratropium-albuterol  0.5-2.5 (3) MG/3ML Soln Commonly known as: DUONEB Take 3 mLs by nebulization every 6 (six) hours as needed.    montelukast  10 MG tablet Commonly known as: SINGULAIR  Take 1 tablet (10 mg total) by mouth at bedtime.   MULTI VITAMIN DAILY PO Take 1 tablet by mouth daily at 12 noon.   predniSONE  20 MG tablet Commonly known as: DELTASONE  Take 2 tablets (40 mg total) by mouth daily with breakfast. Start taking on: December 14, 2024   PRESERVISION AREDS 2 PO Take 1 capsule by mouth daily.   propranolol  10 MG tablet Commonly known as: INDERAL  Take 1 tablet (10 mg total) by mouth daily as needed (panic attack).   Stiolto Respimat  2.5-2.5 MCG/ACT Aers Generic drug: Tiotropium Bromide-Olodaterol Inhale 2 puffs into the lungs daily.        Follow-up Information     Ransom Other, MD. Schedule an appointment as soon as possible for a visit in 1 week(s).   Specialty: Internal Medicine Contact information: [address] [place] [address] [phone]         Kassie Acquanetta Bradley, MD. Schedule an appointment as soon as possible for a visit in 1 month(s).   Specialty: Pulmonary Disease Contact information: [address] [place] [address] [phone]                Discharge Exam: Filed Weights   12/11/24 0728 12/12/24 0230  Weight: 81.6 kg 81.4 kg   Constitutional: NAD, calm, comfortable Eyes: PERRL, lids and conjunctivae normal ENMT: Mucous membranes are moist. Posterior pharynx clear of any exudate or lesions.Normal dentition.  Neck: normal, supple, no masses, no thyromegaly Respiratory: clear to auscultation bilaterally, no wheezing, no crackles. Normal respiratory effort. No accessory muscle use.  Cardiovascular: Regular rate and rhythm, no murmurs / rubs / gallops. No extremity edema. 2+ pedal pulses. No carotid bruits.  Abdomen: no tenderness, no masses palpated. No hepatosplenomegaly. Bowel sounds positive.  Musculoskeletal: no clubbing / cyanosis. No joint deformity upper and lower extremities. Good ROM, no contractures. Normal  muscle tone.  Skin: no rashes, lesions, ulcers. No induration Neurologic: CN 2-12 grossly intact. Sensation intact, DTR normal. Strength 5/5 x all 4 extremities.  Psychiatric: Normal judgment and insight. Alert and oriented x 3. Normal mood.    Condition at discharge: good  The results of significant diagnostics from this hospitalization (including imaging, microbiology, ancillary and laboratory) are listed below for reference.   Imaging Studies: DG Chest Port 1 View Result Date: 12/11/2024 EXAM: 1 VIEW(S) XRAY OF THE CHEST 12/11/2024 07:23:00 AM COMPARISON: 09/05/2024 CLINICAL HISTORY: Shortness of breath. FINDINGS: LUNGS AND PLEURA: Emphysematous changes most prominent in upper lung zones. Chronic bibasilar reticular opacities. The patient has a known right upper lobe pulmonary nodule, which is only faintly seen on today's exam. No pleural effusion. No pneumothorax. HEART AND MEDIASTINUM: Aortic atherosclerosis. No acute abnormality of the cardiac and mediastinal silhouettes. BONES AND SOFT TISSUES: No acute osseous abnormality. IMPRESSION: 1. 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 2. Chronic bibasilar reticular  opacities. 3. Known right upper lobe pulmonary nodule, faintly seen. 4. Aortic atherosclerosis. Electronically signed by: Ryan Salvage MD 12/11/2024 07:30 AM EST RP Workstation: [id number]    Microbiology: Results for orders placed or performed during the hospital encounter of 12/11/24  Resp panel by RT-PCR (RSV, Flu A&B, Covid) Anterior Nasal Swab     Status: Abnormal   Collection Time: 12/11/24  7:11 AM   Specimen: Anterior Nasal Swab  Result Value Ref Range Status   SARS Coronavirus 2 by RT PCR NEGATIVE NEGATIVE Final   Influenza A by PCR NEGATIVE NEGATIVE Final   Influenza B by PCR NEGATIVE NEGATIVE Final    Comment: (NOTE) The Xpert Xpress SARS-CoV-2/FLU/RSV plus assay is intended as an aid in the diagnosis of influenza from Nasopharyngeal swab  specimens and should not be used as a sole basis for treatment. Nasal washings and aspirates are unacceptable for Xpert Xpress SARS-CoV-2/FLU/RSV testing.  Fact Sheet for Patients: bloggercourse.com  Fact Sheet for Healthcare Providers: seriousbroker.it  This test is not yet approved or cleared by the [place]  FDA and has been authorized for detection and/or diagnosis of SARS-CoV-2 by FDA under an Emergency Use Authorization (EUA). This EUA will remain in effect (meaning this test can be used) for the duration of the COVID-19 declaration under Section 564(b)(1) of the Act, 21 U.S.C. section 360bbb-3(b)(1), unless the authorization is terminated or revoked.     Resp Syncytial Virus by PCR POSITIVE (A) NEGATIVE Final    Comment: (NOTE) Fact Sheet for Patients: bloggercourse.com  Fact Sheet for Healthcare Providers: seriousbroker.it  This test is not yet approved or cleared by the [place]  FDA and has been authorized for detection and/or diagnosis of SARS-CoV-2 by FDA under an Emergency Use Authorization (EUA). This EUA will remain in effect (meaning this test can be used) for the duration of the COVID-19 declaration under Section 564(b)(1) of the Act, 21 U.S.C. section 360bbb-3(b)(1), unless the authorization is terminated or revoked.  Performed at [facility], [address] [place], [place] [address]     Labs: CBC: Recent Labs  Lab 12/11/24 [phone] 12/11/24 0748 12/12/24 0409  WBC 8.7  --  9.7  NEUTROABS 6.1  --   --   HGB 16.4 17.0  17.0 15.9  HCT 49.5 50.0  50.0 47.7  MCV 90.0  --  90.2  PLT 276  --  273   Basic Metabolic Panel: Recent Labs  Lab 12/11/24 0736 12/11/24 0748 12/12/24 0409  NA 138 136  136 134*  K 4.7 5.5*  5.5* 5.1  CL 99 97* 94*  CO2 30  --  34*  GLUCOSE 118* 119* 143*  BUN 11 15 20   CREATININE 0.80 0.90 0.81   CALCIUM 9.4  --  9.2   Liver Function Tests: Recent Labs  Lab 12/11/24 0736  AST 26  ALT 14  ALKPHOS 105  BILITOT 0.6  PROT 7.5  ALBUMIN  4.4   CBG: No results for input(s): GLUCAP in the last 168 hours.  Discharge time spent: 40 minutes.  Signed: Deliliah Room, MD [facility] 12/13/2024 "

## 2024-12-17 ENCOUNTER — Telehealth (INDEPENDENT_AMBULATORY_CARE_PROVIDER_SITE_OTHER): Admitting: Pulmonary Disease

## 2024-12-17 ENCOUNTER — Telehealth (HOSPITAL_BASED_OUTPATIENT_CLINIC_OR_DEPARTMENT_OTHER): Payer: Self-pay

## 2024-12-17 ENCOUNTER — Ambulatory Visit (HOSPITAL_BASED_OUTPATIENT_CLINIC_OR_DEPARTMENT_OTHER): Admitting: Pulmonary Disease

## 2024-12-17 ENCOUNTER — Encounter (HOSPITAL_BASED_OUTPATIENT_CLINIC_OR_DEPARTMENT_OTHER): Payer: Self-pay | Admitting: Pulmonary Disease

## 2024-12-17 DIAGNOSIS — B974 Respiratory syncytial virus as the cause of diseases classified elsewhere: Secondary | ICD-10-CM | POA: Diagnosis not present

## 2024-12-17 DIAGNOSIS — Z87891 Personal history of nicotine dependence: Secondary | ICD-10-CM | POA: Diagnosis not present

## 2024-12-17 DIAGNOSIS — J449 Chronic obstructive pulmonary disease, unspecified: Secondary | ICD-10-CM

## 2024-12-17 DIAGNOSIS — J441 Chronic obstructive pulmonary disease with (acute) exacerbation: Secondary | ICD-10-CM | POA: Diagnosis not present

## 2024-12-17 DIAGNOSIS — R918 Other nonspecific abnormal finding of lung field: Secondary | ICD-10-CM

## 2024-12-17 DIAGNOSIS — J210 Acute bronchiolitis due to respiratory syncytial virus: Secondary | ICD-10-CM

## 2024-12-17 DIAGNOSIS — J9611 Chronic respiratory failure with hypoxia: Secondary | ICD-10-CM

## 2024-12-17 DIAGNOSIS — J9612 Chronic respiratory failure with hypercapnia: Secondary | ICD-10-CM | POA: Diagnosis not present

## 2024-12-17 MED ORDER — PREDNISONE 10 MG PO TABS: ORAL_TABLET | ORAL | 0 refills | Status: AC

## 2024-12-17 NOTE — Telephone Encounter (Signed)
 Appt made for pt NFN   Copied from CRM #[id number]. Topic: General - Other >> Dec 17, 2024 10:12 AM Martin Mendez wrote: Reason for CRM: pt states he just discharged from [facility] on 12/13/24 for RSV and was advised to touch base with Dr. Kassie or her nurse, he stated he didn't need to schedule an appt.

## 2024-12-17 NOTE — Progress Notes (Signed)
 "  Virtual Visit via Video Note  I connected with Lamar GORMAN Bring on 12/17/24 at 11:15 AM EST by a video enabled telemedicine application and verified that I am speaking with the correct person using two identifiers.  Location: Patient: Home Provider:  Pulmonary   I discussed the limitations of evaluation and management by telemedicine and the availability of in person appointments. The patient expressed understanding and agreed to proceed.   I discussed the assessment and treatment plan with the patient. The patient was provided an opportunity to ask questions and all were answered. The patient agreed with the plan and demonstrated an understanding of the instructions.   The patient was advised to call back or seek an in-person evaluation if the symptoms worsen or if the condition fails to improve as anticipated.   Subjective:   PATIENT ID: Lamar GORMAN Bring GENDER: male DOB: 1946/07/21, MRN: [id number]   HPI  Chief Complaint  Patient presents with   COPD    Reason for Visit: Follow-up  Mr. Delsin Copen is a 78 year old male former smoker with emphysema and PTSD who presents for follow-up  Synopsis: He was admitted from 06/05/2021 to 06/09/2021 for hematemesis.  EGD on 06/05/2021 demonstrated gastritis, duodenitis and nonbleeding duodenal ulcer.  His hospital course was complicated by acute respiratory failure with hypercapnia.  He was treated with noninvasive ventilation with improvement.  Pulmonary was consulted while inpatient and recommended noninvasive ventilation for outpatient management. COPD well-controlled with Symbicort , Spiriva  and BiPAP  12/49/23 Wife present and provides additional support. Since our last visit we have discussed his recent CT and PET imaging concerning for possible slow growing RLL lung lesion concerning for primary lung cancer. He is scheduled with radiation oncology for further discussion and likely plan for radiation if offered. His overall lung  health is fair and he has been compliant with BiPAP which improves his breathing. Compliant with bronchodilators. No exacerbations since 05/2021. He reports he is being treated with ARED2 for AMD, which is less likely to contribute to risk of lung cancer.  05/30/23 Wife present and provides additional history. Has completed radiation with improved RLL nodule size on May CT. He is compliant with BiPAP nightly and occasionally wife reports some leaking at night. He reports occasional cough every 3-4 days and once he produces sputum he is fine. Compliant with his inhalers. Uses nebulizer for these episodes. Will have hoarseness by the end of the day. Using BiPAP humidifier.   12/05/23 Since our last visit he reports stable shortness of breath, coughing with good sputum production. No wheezing. Compliant with BiPAP nightly. Compliant with Symbicort  and Spiriva . Uses 0-3 albuterol  as needed. Uses nebulzer once a week to help facilitate sputum production as mucinex . Denies exacerbations requiring antibiotics or prednisone . Does have mild cold symptoms that resolve quickly. During the day oxygen  level range from 92-97%  03/08/24 Since our last visit we have had to stop the ICS component of his inhaler due to thrush/hoarsness. Was switched to Stiolto on 02/06/24 due to shortness of breath. But this shortness of breath preceded him being on triple therapy so worried about progression of his COPD. He also reports concerns for borderline low oxygen  saturations. Has started budesonide  and reports initial chest tightness but improves after a minute.  08/01/24 Since our last visit overall doing well on Stiolto and albuterol  neb followed by budesonide  neb twice a day. He continues to have mucous production with chest congestion. He is using his albuterol  inhaler more often. He reports his  POC is not working appropriately and currently on his 3rd device.   10/28/24 He was seen in the ED on 09/05/24 with chest tightness and  treated for COPD exacerbation. His wife was recently discharged for a heart attack requiring stent. Both husband and wife have been home with no further issues. On our last visit we were on daily azithromycin  and he completed ~20 days with overall no improvement. Currently his symptoms are at baseline with shortness of breath, sputum production and chest congestion that improves with nebulizers. He is compliant with budesonide  and albuterol  and Stiolto. He is compliant with continuous nightly oxygen  and portable as needed. He did not like the stationary one that was sent by [employer] so returned to [employer].  12/17/24 Since our last visit he was hospitalized 12/11/24-12/13/24 for RSV  and treated with prednisone  and zpack, completed both this morning. Discharged with montelukast . Required 3L on discharge. He is still coughing and hoarse due to this. Denies wheezing. Oxygen  levels on room air ranging 92-93%. Will drop to 87% with activity and will take up to 1 minute to recover with rest. Has been using Albuterol  HFA 1-2 puffs as needed. Wife now has infection. Compliant with BiPAP nightly.  Prior inhalers: Incruse - prefers Spiriva   Social History: Former smoker  Past Medical History:  Diagnosis Date   COPD (chronic obstructive pulmonary disease) (HCC)    History of radiation therapy    Right Lung- 11/24/22-12/01/22- Dr. Lynwood Nasuti   PTSD (post-traumatic stress disorder)     Outpatient Medications Prior to Visit  Medication Sig Dispense Refill   albuterol  (ACCUNEB ) 0.63 MG/3ML nebulizer solution Take 2.5 mg by nebulization in the morning and at bedtime.     albuterol  (VENTOLIN  HFA) 108 (90 Base) MCG/ACT inhaler TAKE 2 PUFFS BY MOUTH EVERY 6 HOURS AS NEEDED FOR WHEEZE OR SHORTNESS OF BREATH 18 each 2   azithromycin  (ZITHROMAX  Z-PAK) 250 MG tablet Take 2 tablets (500 mg) on  Day 1,  followed by 1 tablet (250 mg) once daily on Days 2 through 5. 6 each 0   budesonide  (PULMICORT ) 0.5 MG/2ML nebulizer  solution Take 2 mLs (0.5 mg total) by nebulization 2 (two) times daily. 120 mL 5   citalopram  (CELEXA ) 20 MG tablet Take 1 tablet (20 mg total) by mouth every morning. 90 tablet 3   dextromethorphan -guaiFENesin  (MUCINEX  DM) 30-600 MG 12hr tablet Take 1 tablet by mouth 2 (two) times daily.     ipratropium-albuterol  (DUONEB) 0.5-2.5 (3) MG/3ML SOLN Take 3 mLs by nebulization every 6 (six) hours as needed. 360 mL 5   montelukast  (SINGULAIR ) 10 MG tablet Take 1 tablet (10 mg total) by mouth at bedtime. 10 tablet 0   Multiple Vitamin (MULTI VITAMIN DAILY PO) Take 1 tablet by mouth daily at 12 noon.     Multiple Vitamins-Minerals (PRESERVISION AREDS 2 PO) Take 1 capsule by mouth daily.     propranolol  (INDERAL ) 10 MG tablet Take 1 tablet (10 mg total) by mouth daily as needed (panic attack).     Respiratory Therapy Supplies (CARETOUCH CPAP & BIPAP HOSE) MISC BiPAP     Tiotropium Bromide-Olodaterol (STIOLTO RESPIMAT ) 2.5-2.5 MCG/ACT AERS Inhale 2 puffs into the lungs daily. 4 g 11   predniSONE  (DELTASONE ) 20 MG tablet Take 2 tablets (40 mg total) by mouth daily with breakfast. 8 tablet 0   No facility-administered medications prior to visit.    Review of Systems  Constitutional:  Negative for chills, diaphoresis, fever, malaise/fatigue and weight loss.  HENT:  Negative for congestion.   Respiratory:  Positive for cough. Negative for hemoptysis, sputum production, shortness of breath and wheezing.   Cardiovascular:  Negative for chest pain, palpitations and leg swelling.     Objective:   There were no vitals filed for this visit.      Physical Exam: General: Well-appearing, no acute distress HENT: [place], AT Eyes: EOMI, no scleral icterus Respiratory: No respiratory distress or audible wheezing Neuro: AAO x4, CNII-XII grossly intact Psych: Normal mood, normal affect   Data Reviewed:  Imaging: CTA 10/10/2019-moderate centrilobular emphysema.  Right upper lobe 3 mm nodular density, right  lower lobe 3 mm nodule.  Left upper lobe subpleural 2 and 3 mm nodule.  Left lower lobe 3 mm nodule.  No aneurysm or dissection.  No acute findings in chest abdomen or pelvis CT Chest Lung Cancer 07/06/22 - Aggressive appearing pulmonary nodule measuring 1.14 cm in superior segment of right lower lobe concerning for neoplasm PET/CT 07/25/22 - 11mm RLL nodule with minimal abnormal FDG avidity CT Chest 10/24/22 - 13 mm. Severe emphysema. Irregular solid RLL nodule 13.3 mm, increased from 11/4 mm. Stable RUL 5mm nodule CT Chest 04/14/23 - Severe emphysema. Slightly diminished size of RLL nodule from 1.4 x 0.9 > 1.2 x 0.6 cm. Unchanged 0.5 cm RUL nodule CT Chest 10/24/23 - Decreased interval size of right lower lobe nodule. Increase in RUL nodule 5x 8 mm, mildly increased. Centrilobular emphysema. CT Chest 05/06/24 - Unchanged 8 mm stable  PFT: Spirometry 04/19/22 FVC 1.38 (33%) FEV1 0.72 (24%) Ratio 52   Interpretation: Very severe obstructive defect  Labs: CBC    Component Value Date/Time   WBC 9.7 12/12/2024 0409   RBC 5.29 12/12/2024 0409   HGB 15.9 12/12/2024 0409   HGB 13.5 12/30/2021 1454   HCT 47.7 12/12/2024 0409   HCT 42.9 12/30/2021 1454   PLT 273 12/12/2024 0409   MCV 90.2 12/12/2024 0409   MCV 83 12/30/2021 1454   MCH 30.1 12/12/2024 0409   MCHC 33.3 12/12/2024 0409   RDW 13.4 12/12/2024 0409   RDW 15.4 12/30/2021 1454   LYMPHSABS 1.4 12/11/2024 0736   LYMPHSABS 1.5 12/30/2021 1454   MONOABS 0.7 12/11/2024 0736   EOSABS 0.3 12/11/2024 0736   EOSABS 0.4 12/30/2021 1454   BASOSABS 0.1 12/11/2024 0736   BASOSABS 0.1 12/30/2021 1454   Absolute eosinophils  06/05/2021-300 12/30/2021 - 400  ABG    Component Value Date/Time   PHART 7.325 (L) 06/08/2021 2200   PCO2ART 81.4 (HH) 06/08/2021 2200   PO2ART 105 06/08/2021 2200   HCO3 36.6 (H) 12/11/2024 0748   TCO2 39 (H) 12/11/2024 0748   TCO2 34 (H) 12/11/2024 0748   O2SAT 74 12/11/2024 0748   Interpretation: Chronic  hypercarbic respiratory failure  CPAP Compliance 05/01/23-05/30/23 Usage 29/30 days (97%) >4 hours 29 days (97%) Avg hours used 6 hours 3 min BiPAP 10/5 AHI 0.9 Leaks Max 7.1  Assessment & Plan:   Discussion: 79 year old male with RLL and RUL pulmonary nodules, severe emphysema, chronic hypercarbic respiratory failure on BiPAP for COPD who presents for follow-up. Hospital course reviewed for COPD exacerbation secondary to RSV. On LAMA/LABA with ICS nebulizer for maintenance.   COPD exacerbation 2/2 RSV infection - improving, persistent cough --Discussed hand hygiene, avoidance of sick contacts when able and cleaning NIV device when illness has resolved --Provided prednisone  if cough, wheezing recurs  COPD, very severe (FEV1 23%)  --CONTINUE budesonide  nebulizer 0.5 mg/2ml in the morning and evening. Consider  using albuterol  beforehand --CONTINUE duonebs (ipratropium-albuterol ) nebulizer up to twice a day AS NEEDED for mucous clearance --CONTINUE Stiolto 2.5 TWO puffs ONCE daily.  --CONTINUE Albuterol  AS NEEDED for shortness of breath or wheezing --Previously on chronic macrolide however ineffective --Defer further PFTs as it does not change management  Chronic hypercarbic respiratory failure secondary to COPD --CONTINUE oxygen  at 2L pulsed with activity --CONTINUE BiPAP with nightly oxygen  --DME: [employer] --Last re-certification in-hospital on 12/13/24. Due 01/2026  Right lower lobe nodule - continue to improve in size Right upper lobe nodule - slightly increased in size. Continue surveillance --Stable 05/06/24 post treatment nodule in RLL 8 mm --Reviewed CT 11/18/24. Stable right upper lobe nodule in the last 6 months --Rad Onc following with surveillance imaging. Planned for 05/2025  Tobacco cessation --Quit smoking July 2022  Health Maintenance Immunization History  Administered Date(s) Administered   Fluad Quad(high Dose 65+) 10/11/2021, 08/04/2022   Influenza-Unspecified  09/14/2008   PFIZER Comirnaty(Gray Top)Covid-19 Tri-Sucrose Vaccine 06/06/2021   Zoster Recombinant(Shingrix) 10/17/2022   CT Lung Screen- Currently followed by Rad Onc  No orders of the defined types were placed in this encounter.  Meds ordered this encounter  Medications   predniSONE  (DELTASONE ) 10 MG tablet    Sig: Take 4 tablets (40 mg total) by mouth daily with breakfast for 2 days, THEN 3 tablets (30 mg total) daily with breakfast for 2 days, THEN 2 tablets (20 mg total) daily with breakfast for 2 days, THEN 1 tablet (10 mg total) daily with breakfast for 2 days.    Dispense:  20 tablet    Refill:  0   Return in about 3 months (around 03/16/2025).   I have spent a total time of 35-minutes on the day of the appointment including chart review, data review, collecting history, coordinating care and discussing medical diagnosis and plan with the patient/family. Past medical history, allergies, medications were reviewed. Pertinent imaging, labs and tests included in this note have been reviewed and interpreted independently by me.  Jakson Delpilar Slater Staff, MD [facility]     "

## 2025-03-17 ENCOUNTER — Ambulatory Visit (HOSPITAL_BASED_OUTPATIENT_CLINIC_OR_DEPARTMENT_OTHER): Admitting: Pulmonary Disease

## 2025-03-18 ENCOUNTER — Ambulatory Visit (HOSPITAL_BASED_OUTPATIENT_CLINIC_OR_DEPARTMENT_OTHER): Admitting: Pulmonary Disease

## 2025-04-29 ENCOUNTER — Ambulatory Visit (HOSPITAL_BASED_OUTPATIENT_CLINIC_OR_DEPARTMENT_OTHER): Admitting: Pulmonary Disease

## 2025-05-22 ENCOUNTER — Ambulatory Visit: Admitting: Radiation Oncology

## 2025-08-20 ENCOUNTER — Ambulatory Visit: Admitting: Physician Assistant
